# Patient Record
Sex: Male | Born: 1945 | Race: Black or African American | Hispanic: No | Smoking: Former smoker
Health system: Southern US, Community
[De-identification: ages and names within clinical notes are randomized; demographics above are authoritative.]

## PROBLEM LIST (undated history)

## (undated) DIAGNOSIS — I442 Atrioventricular block, complete: Secondary | ICD-10-CM

## (undated) DIAGNOSIS — N189 Chronic kidney disease, unspecified: Secondary | ICD-10-CM

## (undated) DIAGNOSIS — I428 Other cardiomyopathies: Secondary | ICD-10-CM

## (undated) DIAGNOSIS — Z950 Presence of cardiac pacemaker: Secondary | ICD-10-CM

## (undated) DIAGNOSIS — E875 Hyperkalemia: Secondary | ICD-10-CM

## (undated) DIAGNOSIS — I10 Essential (primary) hypertension: Secondary | ICD-10-CM

## (undated) DIAGNOSIS — M109 Gout, unspecified: Secondary | ICD-10-CM

## (undated) DIAGNOSIS — I2510 Atherosclerotic heart disease of native coronary artery without angina pectoris: Secondary | ICD-10-CM

## (undated) DIAGNOSIS — K219 Gastro-esophageal reflux disease without esophagitis: Secondary | ICD-10-CM

## (undated) DIAGNOSIS — I5022 Chronic systolic (congestive) heart failure: Secondary | ICD-10-CM

## (undated) HISTORY — DX: Hyperkalemia: E87.5

## (undated) HISTORY — DX: Gout, unspecified: M10.9

## (undated) HISTORY — DX: Atrioventricular block, complete: I44.2

## (undated) HISTORY — DX: Essential (primary) hypertension: I10

## (undated) HISTORY — DX: Atherosclerotic heart disease of native coronary artery without angina pectoris: I25.10

## (undated) HISTORY — DX: Chronic kidney disease, unspecified: N18.9

## (undated) HISTORY — PX: INGUINAL HERNIA REPAIR: SUR1180

## (undated) HISTORY — DX: Chronic systolic (congestive) heart failure: I50.22

## (undated) HISTORY — DX: Other cardiomyopathies: I42.8

---

## 2001-08-18 ENCOUNTER — Encounter: Admission: RE | Admit: 2001-08-18 | Discharge: 2001-11-15 | Payer: Self-pay | Admitting: *Deleted

## 2002-01-27 ENCOUNTER — Encounter: Admission: RE | Admit: 2002-01-27 | Discharge: 2002-04-27 | Payer: Self-pay | Admitting: *Deleted

## 2002-04-28 ENCOUNTER — Encounter: Admission: RE | Admit: 2002-04-28 | Discharge: 2002-04-28 | Payer: Self-pay | Admitting: *Deleted

## 2002-05-19 ENCOUNTER — Encounter: Admission: RE | Admit: 2002-05-19 | Discharge: 2002-05-19 | Payer: Self-pay | Admitting: *Deleted

## 2003-09-06 ENCOUNTER — Ambulatory Visit (HOSPITAL_COMMUNITY): Admission: RE | Admit: 2003-09-06 | Discharge: 2003-09-06 | Payer: Self-pay | Admitting: Family Medicine

## 2004-11-08 ENCOUNTER — Ambulatory Visit (HOSPITAL_COMMUNITY): Admission: RE | Admit: 2004-11-08 | Discharge: 2004-11-08 | Payer: Self-pay | Admitting: Gastroenterology

## 2006-07-25 ENCOUNTER — Emergency Department (HOSPITAL_COMMUNITY): Admission: EM | Admit: 2006-07-25 | Discharge: 2006-07-25 | Payer: Self-pay | Admitting: Emergency Medicine

## 2009-02-27 ENCOUNTER — Ambulatory Visit (HOSPITAL_COMMUNITY): Admission: RE | Admit: 2009-02-27 | Discharge: 2009-02-27 | Payer: Self-pay | Admitting: General Surgery

## 2009-03-16 ENCOUNTER — Ambulatory Visit (HOSPITAL_COMMUNITY): Admission: RE | Admit: 2009-03-16 | Discharge: 2009-03-16 | Payer: Self-pay | Admitting: Interventional Cardiology

## 2009-05-20 ENCOUNTER — Inpatient Hospital Stay (HOSPITAL_COMMUNITY): Admission: EM | Admit: 2009-05-20 | Discharge: 2009-05-23 | Payer: Self-pay | Admitting: Emergency Medicine

## 2009-05-20 ENCOUNTER — Encounter (INDEPENDENT_AMBULATORY_CARE_PROVIDER_SITE_OTHER): Payer: Self-pay | Admitting: *Deleted

## 2009-05-20 HISTORY — PX: INSERT / REPLACE / REMOVE PACEMAKER: SUR710

## 2010-08-01 ENCOUNTER — Encounter: Payer: Self-pay | Admitting: Internal Medicine

## 2010-08-20 ENCOUNTER — Ambulatory Visit: Payer: Self-pay | Admitting: Cardiology

## 2010-08-26 ENCOUNTER — Ambulatory Visit: Payer: Self-pay | Admitting: Internal Medicine

## 2010-09-05 ENCOUNTER — Encounter
Admission: RE | Admit: 2010-09-05 | Discharge: 2010-09-05 | Payer: Self-pay | Source: Home / Self Care | Attending: General Surgery | Admitting: General Surgery

## 2010-10-08 NOTE — Miscellaneous (Signed)
Summary: Device preload  Clinical Lists Changes  Observations: Added new observation of PPM INDICATN: CHB (08/01/2010 9:18) Added new observation of MAGNET RTE: BOL 85 ERI 65 (08/01/2010 9:18) Added new observation of PPMLEADSTAT2: active (08/01/2010 9:18) Added new observation of PPMLEADSER2: [id number] (08/01/2010 9:18) Added new observation of PPMLEADMOD2: 5076  (08/01/2010 9:18) Added new observation of PPMLEADDOI2: 05/20/2009  (08/01/2010 9:18) Added new observation of PPMLEADLOC2: RV  (08/01/2010 9:18) Added new observation of PPMLEADSTAT1: active  (08/01/2010 9:18) Added new observation of PPMLEADSER1: [id number]  (08/01/2010 9:18) Added new observation of PPMLEADMOD1: 5076  (08/01/2010 9:18) Added new observation of PPMLEADDOI1: 05/20/2009  (08/01/2010 9:18) Added new observation of PPMLEADLOC1: RA  (08/01/2010 9:18) Added new observation of PPM DOI: 05/20/2009  (08/01/2010 9:18) Added new observation of PPM SERL#: [id number]  (08/01/2010 9:18) Added new observation of PPM MODL#: [id number]  (08/01/2010 4:13) Added new observation of PACEMAKERMFG: Medtronic  (08/01/2010 9:18) Added new observation of PPM IMP MD: Lady Deutscher, MD  (08/01/2010 9:18) Added new observation of PPM REFER MD: Peter Swaziland, MD  (08/01/2010 9:18) Added new observation of PACEMAKER MD: Hillis Range, MD  (08/01/2010 9:18)      PPM Specifications Following MD:  Hillis Range, MD     Referring MD:  Peter Swaziland, MD PPM Vendor:  Medtronic     PPM Model Number:  ADDRL1     PPM Serial Number:  [id number] PPM DOI:  05/20/2009     PPM Implanting MD:  Lady Deutscher, MD  Lead 1    Location: RA     DOI: 05/20/2009     Model #: [id number]     Serial #: [id number]     Status: active Lead 2    Location: RV     DOI: 05/20/2009     Model #: [id number]     Serial #: [id number]     Status: active  Magnet Response Rate:  BOL 85 ERI 65  Indications:  CHB

## 2010-10-10 NOTE — Procedures (Signed)
Summary: pacer check/medtronic   Current Medications (verified): 1)  Amlodipine Besylate 5 Mg Tabs (Amlodipine Besylate) .... Take 1 Tablet By Mouth Once Daily 2)  Diphenhydramine Hcl 25 Mg Tabs (Diphenhydramine Hcl) .... Take As Needed 3)  Alavert 10 Mg Tabs (Loratadine) .... Take As Needed 4)  Aspirin 81 Mg Tbec (Aspirin) .... Take 1 Tablet By Mouth Once Daily 5)  Advair Hfa 45-21 Mcg/act Aero (Fluticasone-Salmeterol) .... Use As Needed 6)  Vitamin C-Rose Hips 500 Mg Tabs (Ascorbic Acid) .... Take 1 Tablet By Mouth Once Daily 7)  Vitamin E 600 Unit Caps (Vitamin E) .... Take 1 Capsule By Mouth Once Daily 8)  B Complex-B12  Tabs (B Complex Vitamins) .... Take 1 Tablet By Mouth Once Daily  Allergies (verified): No Known Drug Allergies  PPM Specifications Following MD:  Hillis Range, MD     Referring MD:  Peter Swaziland, MD PPM Vendor:  Medtronic     PPM Model Number:  ADDRL1     PPM Serial Number:  [id number] PPM DOI:  05/20/2009     PPM Implanting MD:  Lady Deutscher, MD  Lead 1    Location: RA     DOI: 05/20/2009     Model #: [id number]     Serial #: [id number]     Status: active Lead 2    Location: RV     DOI: 05/20/2009     Model #: [id number]     Serial #: [id number]     Status: active  Magnet Response Rate:  BOL 85 ERI 65  Indications:  CHB   PPM Follow Up Battery Voltage:  2.78 V     Battery Est. Longevity:  12 yrs       PPM Device Measurements Atrium  Amplitude: 2.00 mV, Impedance: 413 ohms, Threshold: 1.00 V at 0.40 msec Right Ventricle  Amplitude: 8.00 mV, Impedance: 530 ohms, Threshold: 0.50 V at 0.40 msec  Episodes MS Episodes:  1699     Percent Mode Switch:  0.2%     Ventricular High Rate:  1     Atrial Pacing:  1.0%     Ventricular Pacing:  97.1%  Parameters Mode:  DDD     Lower Rate Limit:  60     Upper Rate Limit:  130 Paced AV Delay:  180     Sensed AV Delay:  150 Next Cardiology Appt Due:  02/07/2011 Tech Comments:  [place] [facility] PT---1699 MODE SWITCHES--LONGEST WAS 4 MIN 2  SECONDS.  1 VHR EPISODE LASTING 11 BEATS.  NORMAL DEVICE FUNCTION.  CHANGED RV OUTPUT FROM 2.00 TO 2.5 V. PT NOT INTERESTED IN CARELINK PREFERS OV.  ROV IN 6 MTHS W/JA. Vella Kohler  August 26, 2010 9:56 AM

## 2010-10-10 NOTE — Cardiovascular Report (Signed)
Summary: Office Visit   Office Visit   Imported By: Roderic Ovens 09/17/2010 14:15:21  _____________________________________________________________________  External Attachment:    Type:   Image     Comment:   External Document

## 2010-12-13 LAB — CBC
HCT: 42.7 % (ref 39.0–52.0)
Hemoglobin: 13.3 g/dL (ref 13.0–17.0)
Hemoglobin: 13.9 g/dL (ref 13.0–17.0)
MCHC: 32.6 g/dL (ref 30.0–36.0)
MCV: 93.6 fL (ref 78.0–100.0)
MCV: 94 fL (ref 78.0–100.0)
MCV: 95 fL (ref 78.0–100.0)
Platelets: 109 10*3/uL — ABNORMAL LOW (ref 150–400)
Platelets: 121 10*3/uL — ABNORMAL LOW (ref 150–400)
Platelets: 139 10*3/uL — ABNORMAL LOW (ref 150–400)
RBC: 4.22 MIL/uL (ref 4.22–5.81)
RBC: 4.48 MIL/uL (ref 4.22–5.81)
RBC: 4.49 MIL/uL (ref 4.22–5.81)
RDW: 14.3 % (ref 11.5–15.5)
RDW: 15 % (ref 11.5–15.5)
WBC: 13.2 10*3/uL — ABNORMAL HIGH (ref 4.0–10.5)
WBC: 7.8 10*3/uL (ref 4.0–10.5)
WBC: 8.3 10*3/uL (ref 4.0–10.5)

## 2010-12-13 LAB — BLOOD GAS, ARTERIAL
Acid-base deficit: 2.6 mmol/L — ABNORMAL HIGH (ref 0.0–2.0)
Bicarbonate: 21.7 mEq/L (ref 20.0–24.0)
Drawn by: 246861
O2 Content: 3 L/min
O2 Saturation: 98.3 %
Patient temperature: 98.6
TCO2: 22.8 mmol/L (ref 0–100)
pCO2 arterial: 37.3 mmHg (ref 35.0–45.0)
pH, Arterial: 7.382 (ref 7.350–7.450)
pO2, Arterial: 114 mmHg — ABNORMAL HIGH (ref 80.0–100.0)

## 2010-12-13 LAB — GLUCOSE, CAPILLARY
Glucose-Capillary: 55 mg/dL — ABNORMAL LOW (ref 70–99)
Glucose-Capillary: 55 mg/dL — ABNORMAL LOW (ref 70–99)

## 2010-12-13 LAB — POCT I-STAT 3, ART BLOOD GAS (G3+)
Acid-base deficit: 13 mmol/L — ABNORMAL HIGH (ref 0.0–2.0)
Bicarbonate: 14.7 mEq/L — ABNORMAL LOW (ref 20.0–24.0)
O2 Saturation: 99 %
Patient temperature: 98.6
TCO2: 16 mmol/L (ref 0–100)
pCO2 arterial: 37.3 mmHg (ref 35.0–45.0)
pH, Arterial: 7.203 — ABNORMAL LOW (ref 7.350–7.450)
pO2, Arterial: 163 mmHg — ABNORMAL HIGH (ref 80.0–100.0)

## 2010-12-13 LAB — CARDIAC PANEL(CRET KIN+CKTOT+MB+TROPI)
CK, MB: 3.5 ng/mL (ref 0.3–4.0)
CK, MB: 6.9 ng/mL — ABNORMAL HIGH (ref 0.3–4.0)
CK, MB: 7.9 ng/mL — ABNORMAL HIGH (ref 0.3–4.0)
Relative Index: 0.8 (ref 0.0–2.5)
Relative Index: 1.3 (ref 0.0–2.5)
Relative Index: 1.8 (ref 0.0–2.5)
Total CK: 451 U/L — ABNORMAL HIGH (ref 7–232)
Total CK: 539 U/L — ABNORMAL HIGH (ref 7–232)
Troponin I: 0.08 ng/mL — ABNORMAL HIGH (ref 0.00–0.06)
Troponin I: 0.1 ng/mL — ABNORMAL HIGH (ref 0.00–0.06)

## 2010-12-13 LAB — BASIC METABOLIC PANEL
BUN: 36 mg/dL — ABNORMAL HIGH (ref 6–23)
BUN: 47 mg/dL — ABNORMAL HIGH (ref 6–23)
BUN: 49 mg/dL — ABNORMAL HIGH (ref 6–23)
BUN: 49 mg/dL — ABNORMAL HIGH (ref 6–23)
CO2: 18 mEq/L — ABNORMAL LOW (ref 19–32)
CO2: 22 mEq/L (ref 19–32)
CO2: 26 mEq/L (ref 19–32)
Calcium: 8.5 mg/dL (ref 8.4–10.5)
Calcium: 8.6 mg/dL (ref 8.4–10.5)
Calcium: 9.4 mg/dL (ref 8.4–10.5)
Chloride: 104 mEq/L (ref 96–112)
Chloride: 105 mEq/L (ref 96–112)
Chloride: 107 mEq/L (ref 96–112)
Chloride: 108 mEq/L (ref 96–112)
Chloride: 97 mEq/L (ref 96–112)
Creatinine, Ser: 2.52 mg/dL — ABNORMAL HIGH (ref 0.4–1.5)
Creatinine, Ser: 3.06 mg/dL — ABNORMAL HIGH (ref 0.4–1.5)
Creatinine, Ser: 3.79 mg/dL — ABNORMAL HIGH (ref 0.4–1.5)
Creatinine, Ser: 4.03 mg/dL — ABNORMAL HIGH (ref 0.4–1.5)
Creatinine, Ser: 4.53 mg/dL — ABNORMAL HIGH (ref 0.4–1.5)
GFR calc Af Amer: 16 mL/min — ABNORMAL LOW (ref >60)
GFR calc Af Amer: 18 mL/min — ABNORMAL LOW (ref >60)
GFR calc Af Amer: 20 mL/min — ABNORMAL LOW (ref >60)
GFR calc Af Amer: 21 mL/min — ABNORMAL LOW (ref >60)
GFR calc Af Amer: 31 mL/min — ABNORMAL LOW (ref >60)
GFR calc non Af Amer: 13 mL/min — ABNORMAL LOW (ref >60)
GFR calc non Af Amer: 15 mL/min — ABNORMAL LOW (ref >60)
GFR calc non Af Amer: 18 mL/min — ABNORMAL LOW (ref >60)
GFR calc non Af Amer: 21 mL/min — ABNORMAL LOW (ref >60)
GFR calc non Af Amer: 26 mL/min — ABNORMAL LOW (ref >60)
Glucose, Bld: 104 mg/dL — ABNORMAL HIGH (ref 70–99)
Glucose, Bld: 115 mg/dL — ABNORMAL HIGH (ref 70–99)
Glucose, Bld: 182 mg/dL — ABNORMAL HIGH (ref 70–99)
Potassium: 4.8 mEq/L (ref 3.5–5.1)
Potassium: 5.9 mEq/L — ABNORMAL HIGH (ref 3.5–5.1)
Potassium: 7 mEq/L (ref 3.5–5.1)
Sodium: 137 mEq/L (ref 135–145)
Sodium: 137 mEq/L (ref 135–145)
Sodium: 139 mEq/L (ref 135–145)
Sodium: 141 mEq/L (ref 135–145)

## 2010-12-13 LAB — URINALYSIS, ROUTINE W REFLEX MICROSCOPIC
Glucose, UA: NEGATIVE mg/dL
Ketones, ur: NEGATIVE mg/dL
Nitrite: NEGATIVE
Specific Gravity, Urine: 1.013 (ref 1.005–1.030)
pH: 7.5 (ref 5.0–8.0)

## 2010-12-13 LAB — HEPATIC FUNCTION PANEL
ALT: 170 U/L — ABNORMAL HIGH (ref 0–53)
AST: 224 U/L — ABNORMAL HIGH (ref 0–37)
Albumin: 3.5 g/dL (ref 3.5–5.2)
Alkaline Phosphatase: 159 U/L — ABNORMAL HIGH (ref 39–117)
Bilirubin, Direct: 0.2 mg/dL (ref 0.0–0.3)
Indirect Bilirubin: 1 mg/dL — ABNORMAL HIGH (ref 0.3–0.9)
Total Bilirubin: 1.2 mg/dL (ref 0.3–1.2)
Total Protein: 7.1 g/dL (ref 6.0–8.3)

## 2010-12-13 LAB — LACTIC ACID, PLASMA: Lactic Acid, Venous: 1.9 mmol/L (ref 0.5–2.2)

## 2010-12-13 LAB — PROTIME-INR
INR: 1.2 (ref 0.00–1.49)
INR: 1.2 (ref 0.00–1.49)
Prothrombin Time: 14.7 seconds (ref 11.6–15.2)
Prothrombin Time: 15.4 seconds — ABNORMAL HIGH (ref 11.6–15.2)

## 2010-12-13 LAB — POCT I-STAT, CHEM 8
BUN: 50 mg/dL — ABNORMAL HIGH (ref 6–23)
Calcium, Ion: 1.02 mmol/L — ABNORMAL LOW (ref 1.12–1.32)
Chloride: 111 mEq/L (ref 96–112)
Creatinine, Ser: 3.9 mg/dL — ABNORMAL HIGH (ref 0.4–1.5)
Glucose, Bld: 179 mg/dL — ABNORMAL HIGH (ref 70–99)
HCT: 48 % (ref 39.0–52.0)
Hemoglobin: 16.3 g/dL (ref 13.0–17.0)
Potassium: 5.8 mEq/L — ABNORMAL HIGH (ref 3.5–5.1)
Sodium: 138 mEq/L (ref 135–145)
TCO2: 17 mmol/L (ref 0–100)

## 2010-12-13 LAB — DIFFERENTIAL
Basophils Absolute: 0 10*3/uL (ref 0.0–0.1)
Basophils Relative: 0 % (ref 0–1)
Eosinophils Absolute: 0 10*3/uL (ref 0.0–0.7)
Eosinophils Relative: 0 % (ref 0–5)
Lymphocytes Relative: 16 % (ref 12–46)
Lymphs Abs: 2.2 10*3/uL (ref 0.7–4.0)
Monocytes Absolute: 0.6 10*3/uL (ref 0.1–1.0)
Monocytes Relative: 5 % (ref 3–12)
Neutro Abs: 10.4 10*3/uL — ABNORMAL HIGH (ref 1.7–7.7)
Neutrophils Relative %: 79 % — ABNORMAL HIGH (ref 43–77)

## 2010-12-13 LAB — TROPONIN I: Troponin I: 0.07 ng/mL — ABNORMAL HIGH (ref 0.00–0.06)

## 2010-12-13 LAB — APTT: aPTT: 28 seconds (ref 24–37)

## 2010-12-13 LAB — DIGOXIN LEVEL: Digoxin Level: <0.2 ng/mL — ABNORMAL LOW (ref 0.8–2.0)

## 2010-12-13 LAB — CK TOTAL AND CKMB (NOT AT ARMC)
CK, MB: 7.1 ng/mL — ABNORMAL HIGH (ref 0.3–4.0)
Relative Index: 2.2 (ref 0.0–2.5)
Total CK: 330 U/L — ABNORMAL HIGH (ref 7–232)

## 2010-12-13 LAB — POCT CARDIAC MARKERS
CKMB, poc: 3.1 ng/mL (ref 1.0–8.0)
Myoglobin, poc: >500 ng/mL (ref 12–200)
Troponin i, poc: <0.05 ng/mL (ref 0.00–0.09)

## 2010-12-13 LAB — BRAIN NATRIURETIC PEPTIDE: Pro B Natriuretic peptide (BNP): 612 pg/mL — ABNORMAL HIGH (ref 0.0–100.0)

## 2010-12-16 LAB — COMPREHENSIVE METABOLIC PANEL
ALT: 34 U/L (ref 0–53)
Alkaline Phosphatase: 174 U/L — ABNORMAL HIGH (ref 39–117)
BUN: 39 mg/dL — ABNORMAL HIGH (ref 6–23)
CO2: 27 mEq/L (ref 19–32)
Chloride: 103 mEq/L (ref 96–112)
Glucose, Bld: 107 mg/dL — ABNORMAL HIGH (ref 70–99)
Potassium: 4 mEq/L (ref 3.5–5.1)
Sodium: 136 mEq/L (ref 135–145)
Total Bilirubin: 0.7 mg/dL (ref 0.3–1.2)
Total Protein: 7.3 g/dL (ref 6.0–8.3)

## 2010-12-16 LAB — URINALYSIS, ROUTINE W REFLEX MICROSCOPIC
Bilirubin Urine: NEGATIVE
Hgb urine dipstick: NEGATIVE
Nitrite: NEGATIVE
Protein, ur: NEGATIVE mg/dL
Specific Gravity, Urine: 1.016 (ref 1.005–1.030)
Urobilinogen, UA: 1 mg/dL (ref 0.0–1.0)

## 2010-12-16 LAB — CBC
HCT: 43.5 % (ref 39.0–52.0)
Hemoglobin: 14.7 g/dL (ref 13.0–17.0)
RBC: 4.68 MIL/uL (ref 4.22–5.81)
RDW: 15.2 % (ref 11.5–15.5)
WBC: 5.3 10*3/uL (ref 4.0–10.5)

## 2010-12-16 LAB — DIFFERENTIAL
Basophils Absolute: 0 10*3/uL (ref 0.0–0.1)
Basophils Relative: 1 % (ref 0–1)
Eosinophils Absolute: 0.2 10*3/uL (ref 0.0–0.7)
Monocytes Absolute: 0.5 10*3/uL (ref 0.1–1.0)
Monocytes Relative: 9 % (ref 3–12)
Neutro Abs: 2.6 10*3/uL (ref 1.7–7.7)
Neutrophils Relative %: 49 % (ref 43–77)

## 2010-12-19 ENCOUNTER — Other Ambulatory Visit: Payer: Self-pay | Admitting: *Deleted

## 2010-12-19 DIAGNOSIS — I10 Essential (primary) hypertension: Secondary | ICD-10-CM

## 2010-12-19 MED ORDER — AMLODIPINE BESYLATE 5 MG PO TABS: 5.0000 mg | ORAL_TABLET | Freq: Every day | ORAL | Status: DC

## 2010-12-19 NOTE — Telephone Encounter (Signed)
escribe medication per fax request  

## 2010-12-21 ENCOUNTER — Inpatient Hospital Stay (HOSPITAL_COMMUNITY)
Admission: RE | Admit: 2010-12-21 | Discharge: 2010-12-21 | Disposition: A | Discharge status: Home / Self Care | Payer: BC Managed Care – HMO | Source: Ambulatory Visit | Attending: Family Medicine | Admitting: Family Medicine

## 2010-12-22 ENCOUNTER — Emergency Department (HOSPITAL_COMMUNITY): Payer: BC Managed Care – HMO

## 2010-12-22 ENCOUNTER — Emergency Department (HOSPITAL_COMMUNITY)
Admission: EM | Admit: 2010-12-22 | Discharge: 2010-12-22 | Disposition: A | Discharge status: Home / Self Care | Payer: BC Managed Care – HMO | Attending: Emergency Medicine | Admitting: Emergency Medicine

## 2010-12-22 DIAGNOSIS — M109 Gout, unspecified: Secondary | ICD-10-CM | POA: Insufficient documentation

## 2010-12-22 DIAGNOSIS — Z950 Presence of cardiac pacemaker: Secondary | ICD-10-CM | POA: Insufficient documentation

## 2010-12-22 DIAGNOSIS — M7989 Other specified soft tissue disorders: Secondary | ICD-10-CM | POA: Insufficient documentation

## 2010-12-22 DIAGNOSIS — I10 Essential (primary) hypertension: Secondary | ICD-10-CM | POA: Insufficient documentation

## 2010-12-22 LAB — DIFFERENTIAL
Basophils Absolute: 0 10*3/uL (ref 0.0–0.1)
Basophils Relative: 1 % (ref 0–1)
Eosinophils Absolute: 0.2 10*3/uL (ref 0.0–0.7)
Monocytes Absolute: 0.7 10*3/uL (ref 0.1–1.0)
Monocytes Relative: 10 % (ref 3–12)

## 2010-12-22 LAB — CBC
MCH: 30.3 pg (ref 26.0–34.0)
MCHC: 33.3 g/dL (ref 30.0–36.0)
Platelets: 180 10*3/uL (ref 150–400)
RDW: 14.3 % (ref 11.5–15.5)

## 2010-12-22 LAB — BASIC METABOLIC PANEL
Calcium: 9.1 mg/dL (ref 8.4–10.5)
Creatinine, Ser: 1.25 mg/dL (ref 0.4–1.5)
GFR calc Af Amer: >60 mL/min (ref >60)
GFR calc non Af Amer: 58 mL/min — ABNORMAL LOW (ref >60)
Sodium: 138 mEq/L (ref 135–145)

## 2010-12-22 LAB — URIC ACID: Uric Acid, Serum: 8.4 mg/dL — ABNORMAL HIGH (ref 4.0–7.8)

## 2011-01-21 NOTE — Cardiovascular Report (Signed)
NAME:  [name], [name] NO.:  [id number]   MEDICAL RECORD NO.:  [id number]          PATIENT TYPE:  OIB   LOCATION:  2899                         FACILITY:  [facility]   PHYSICIAN:  [name], MDDATE OF BIRTH:  [birth year]   DATE OF PROCEDURE:  03/16/2009  DATE OF DISCHARGE:  03/16/2009                            CARDIAC CATHETERIZATION   PROCEDURE PERFORMED:  Left heart catheterization, left ventriculogram,  coronary angiogram, and intracoronary vascular ultrasound of the left  anterior descending.   OPERATOR:  Gregory Crafts, MD   INDICATIONS:  Abnormal stress test and abnormal EKG.   PROCEDURE NARRATIVE:  The risks and benefits of cardiac catheterization  were explained to the patient and informed consent was obtained.  He was  brought to the cath lab.  He was prepped and draped in usual sterile  fashion.  His right groin was infiltrated with 1% lidocaine.  A 6-French  sheath was placed into the right femoral artery using modified Seldinger  technique.  Left coronary artery angiography was performed using a JL-  4.0 catheter.  Catheter was advanced to the vessel ostium under  fluoroscopic guidance.  Digital angiography was performed in multiple  projections using hand injection of contrast.  A JR-4 catheter and  subsequently a no torque right catheter were used to try and engage the  right coronary artery.  These were unsuccessful.  A pigtail catheter was  advanced to the left ventricle under fluoroscopic guidance.  Power  injection contrast was performed in the RAO projection to image the left  ventricle.  Catheter was pulled back under continuous hemodynamic  pressure monitoring.  Subsequently, a AR-2 catheter was used, but  unsuccessful finding the right coronary artery.  Subsequently, an [name]  discovered the right coronary artery origin which was coming off the  left cusp.  A CLS 3.5 guiding catheter was then used to engage the left  main and IVUS  of the LAD was performed.  The sheath was removed and an  Angio-Seal was deployed.   FINDINGS:  Left main coronary artery was widely patent.  Left circumflex was a large vessel with mild luminal irregularities.  The OM-1 was a medium-sized vessel and widely patent.  The OM-2 was  small vessel and patent.  The OM-3 was a large vessel with mild  irregularities.  The left anterior descending was a large vessel which reached the apex.  There was a small first diagonal and second diagonal, both of which were  widely patent.  Between the diagonal vessels in the LAD, there was an  approximately 50-60% stenosis.  The right coronary artery was a large dominant vessel.  This vessel  originates off the left cusp.  We used an [name] to engage the vessel.  There was mild-to-moderate atherosclerosis diffusely.  Left ventriculogram showed normal ventricular function with an EF  estimated 55%.   HEMODYNAMICS:  Left ventricular pressure 149/3 with an LVEDP of 16 mmHg.  Aortic pressure 143/84 with a mean aortic pressure of 103 mmHg.   IVUS of The LAD showed no significant stenosis.  Subsequent angiography  of the LAD  showed a significant improvement in the area of stenosis post  IVUS.   IMPRESSION:  1. Mild diffuse coronary artery disease, likely some degree of      vasospasm in the mid left anterior descending.  As the stenosis      resolved at the end of the case without intervention, intracoronary      vascular ultrasound did not show any significant stenosis in the      mid left anterior descending.  2. 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      An [name] catheter was used to engage the right coronary artery.  3. High-degree heart block persists, although the patient is      asymptomatic.  We discussed pacemaker and he is not interested in a      further evaluation at this time since he is not having any      lightheadedness or syncope.   RECOMMENDATIONS:  1. Avoid  rate-slowing drugs.  2. We will treat for vasospasm.  We will low-dose amlodipine.  We may      be able to come off his ARB.  I will follow up with him in the      office.      Gregory Crafts, MD  Electronically Signed     JSV/MEDQ  D:  03/16/2009  T:  03/17/2009  Job:  [id number]   cc:   Lorne Skeens. Hoxworth, M.D.  Lurena Joiner, MD

## 2011-01-24 NOTE — Discharge Summary (Signed)
NAME:  [name], [name] NO.:  [id number]   MEDICAL RECORD NO.:  [id number]          PATIENT TYPE:  AMB   LOCATION:  DAY                          FACILITY:  [facility]   PHYSICIAN:  Sharlet Salina T. Hoxworth, M.D.DATE OF BIRTH:  12-17-1945   DATE OF ADMISSION:  02/27/2009  DATE OF DISCHARGE:  02/27/2009                               DISCHARGE SUMMARY   CHIEF COMPLAINT:  Right inguinal hernia.   HISTORY:  [name] is a 65 year old man brought in for outpatient  right inguinal hernia repair on February 27, 2009.  He had a gradually  increasing swelling in his right groin for several months and on exam  was found to have a partially reducible right inguinal hernia and open  repair was planned.   PAST MEDICAL HISTORY:  Other surgery includes left inguinal hernia  repair 10 years ago, ulnar nerve entrapment repair.  Medically, he is  followed for hypertension and occasional mild bronchospasm.   MEDICATIONS:  1. Avalide daily.  2. Advair Diskus p.r.n.  3. Benadryl.   ALLERGIES:  PENICILLIN.   PHYSICAL EXAMINATION:  Significant for an incompletely reducible right  inguinal hernia.   HOSPITAL COURSE:  On the morning of his procedure, the patient was noted  to be bradycardic in the holding area and as low as 30s with a right  bundle branch block.  After discussion with anesthesiology, we felt his  surgery should be delayed until elective cardiology evaluation.  The  patient was entirely asymptomatic with this.  The patient was therefore  discharged home for further workup.      Lorne Skeens. Hoxworth, M.D.  Electronically Signed     BTH/MEDQ  D:  04/04/2009  T:  04/04/2009  Job:  [id number]

## 2011-01-24 NOTE — Op Note (Signed)
NAME:  [name], [name]              ACCOUNT NO.:  [id number]   MEDICAL RECORD NO.:  [id number]          PATIENT TYPE:  AMB   LOCATION:  ENDO                         FACILITY:  [facility]   PHYSICIAN:  John C. Madilyn Fireman, M.D.    DATE OF BIRTH:  July 17, 1946   DATE OF PROCEDURE:  11/08/2004  DATE OF DISCHARGE:                                 OPERATIVE REPORT   PROCEDURE:  Colonoscopy.   INDICATIONS FOR PROCEDURE:  Average risk colon cancer screening.   DESCRIPTION OF PROCEDURE:  The patient was placed in the left lateral  decubitus position and placed on the pulse monitor with continuous low-flow  oxygen delivered by nasal cannula. He was sedated with 75 mcg IV fentanyl  and 6 milligrams IV Versed. The Olympus video colonoscope was inserted into  the rectum and advanced to the cecum, confirmed by transillumination at  McBurney's point and visualization at the ileocecal valve and appendiceal  orifice. The prep was excellent. The cecum, ascending, transverse,  descending and sigmoid colon all appeared normal with no masses, polyps,  diverticula or other mucosal abnormalities. The rectum likewise appeared  normal. On retroflexed view, the anus revealed no obvious internal  hemorrhoids. The scope was then withdrawn and the patient returned to the  recovery room in stable condition. He tolerated the procedure well and there  were no immediate complications.   IMPRESSION:  Normal colonoscopy.   PLAN:  Next colonoscopy within 10 years to consider flexible sigmoidoscopy  or Hemoccult's in 5 years.      JCH/MEDQ  D:  11/08/2004  T:  11/08/2004  Job:  [id number]   cc:   Molly Maduro A. Nicholos Johns, M.D.  [address]  [place]  [place] [address]  Fax: [phone]

## 2011-01-24 NOTE — Op Note (Signed)
NAME:  TERRIE, GRAJALES              ACCOUNT NO.:  [id number]   MEDICAL RECORD NO.:  [id number]          PATIENT TYPE:  AMB   LOCATION:  ENDO                         FACILITY:  [facility]   PHYSICIAN:  John C. Madilyn Fireman, M.D.    DATE OF BIRTH:  1946/05/01   DATE OF PROCEDURE:  11/08/2004  DATE OF DISCHARGE:                                 OPERATIVE REPORT   INDICATIONS FOR PROCEDURE:  Average risk colon cancer screening.   PROCEDURE:  The patient was placed in the left lateral decubitus position  and placed on pulse monitor with continuous low flow oxygen delivered by  nasal cannula.  She was sedated with   DICTATION ENDS AT THIS POINT      JCH/MEDQ  D:  11/08/2004  T:  11/08/2004  Job:  [id number]

## 2011-03-19 ENCOUNTER — Encounter: Payer: Self-pay | Admitting: Cardiology

## 2011-03-21 ENCOUNTER — Ambulatory Visit (INDEPENDENT_AMBULATORY_CARE_PROVIDER_SITE_OTHER): Payer: BC Managed Care – HMO | Admitting: Internal Medicine

## 2011-03-21 ENCOUNTER — Encounter: Payer: Self-pay | Admitting: Internal Medicine

## 2011-03-21 VITALS — BP 152/90 | HR 76 | Ht 69.5 in | Wt 235.4 lb

## 2011-03-21 DIAGNOSIS — I10 Essential (primary) hypertension: Secondary | ICD-10-CM

## 2011-03-21 DIAGNOSIS — E875 Hyperkalemia: Secondary | ICD-10-CM

## 2011-03-21 DIAGNOSIS — I442 Atrioventricular block, complete: Secondary | ICD-10-CM

## 2011-03-21 LAB — BASIC METABOLIC PANEL
Calcium: 8.6 mg/dL (ref 8.4–10.5)
GFR: 72.71 mL/min (ref >60.00)
Glucose, Bld: 106 mg/dL — ABNORMAL HIGH (ref 70–99)
Sodium: 138 mEq/L (ref 135–145)

## 2011-03-21 NOTE — Assessment & Plan Note (Signed)
The patient is s/p PPM.  His underlying rhythm today remains complete heart block. His pacemaker function is normal.  No changes are made today. I have assured the patient that he will require his pacemaker longterm.  He will return to the device clinic in 6 months.

## 2011-03-21 NOTE — Assessment & Plan Note (Signed)
His last potassium 4/12 was 5.7 .  He states that this has not been repeated. Low K diet advised.  We will check BMET today.

## 2011-03-21 NOTE — Assessment & Plan Note (Signed)
Above goal I have advised that he increase norvasc to 10mg  daily.  He is very clear that he will not change his medicine at this time.  Lifestyle modification is advised.  I have encouraged him to see Dr Swaziland (he states he has an appointment in the next few weeks) to discuss BP control.  The importance of BP control to avoid worsening renal failure, CVA, and CHF/MI were discussed today.

## 2011-03-21 NOTE — Patient Instructions (Signed)
Your physician wants you to follow-up in: 6 months with device clinic You will receive a reminder letter in the mail two months in advance. If you don't receive a letter, please call our office to schedule the follow-up appointment.  Your physician recommends that you return for lab work today BMP

## 2011-03-21 NOTE — Progress Notes (Signed)
[name] is a pleasant 65 y.o. yo patient with a h/o complete heart block sp PPM (MDT) by Dr Reyes Ivan 2010 who presents today to establish care in the Electrophysiology device clinic.   The patient reports doing very well since having a pacemaker implanted and remains very active.  He is convinced that in "the 21st century" that he should not require having a pacemaker.  He inquires about pacemaker removal.   Today, he  denies symptoms of palpitations, chest pain, shortness of breath, orthopnea, PND, lower extremity edema, dizziness, presyncope, syncope, or neurologic sequela.  The patientis tolerating medications without difficulties and is otherwise without complaint today.   Past Medical History  Diagnosis Date  . Complete heart block     s/p PPM by Dr Reyes Ivan  . Chronic renal insufficiency     WITH BASELINE CREATININE OF 1.6  . Hypertension   . Coronary disease     NONOBSTRUCTIVE  . Hyperkalemia     Past Surgical History  Procedure Date  . Pacemaker insertion 05/20/2009    MDT by Dr Reyes Ivan for complete heart block    History   Social History  . Marital Status: Single    Spouse Name: N/A    Number of Children: N/A  . Years of Education: N/A   Occupational History  . Not on file.   Social History Main Topics  . Smoking status: Former Smoker    Quit date: 03/18/1990  . Smokeless tobacco: Not on file  . Alcohol Use: No  . Drug Use: No  . Sexually Active:    Other Topics Concern  . Not on file   Social History Narrative   Lives in [place].    No family history on file.  Allergies  Allergen Reactions  . Avalide (Irbesartan-Hydrochlorothiazide)   . Penicillins   . Tinactin (Tolnaftate)     Current Outpatient Prescriptions  Medication Sig Dispense Refill  . amLODipine (NORVASC) 5 MG tablet Take 1 tablet (5 mg total) by mouth daily.  90 tablet  3  . Ascorbic Acid (VITAMIN C WITH ROSE HIPS) 100 MG tablet Take 100 mg by mouth daily.        Marland Kitchen aspirin 81 MG  tablet Take 81 mg by mouth daily.        Marland Kitchen Cod Liver Oil 1000 MG CAPS Take 1 capsule by mouth.        . Cyanocobalamin (VITAMIN B12 PO) Take by mouth daily.        . Fluticasone-Salmeterol (ADVAIR HFA IN) Inhale into the lungs as needed.        Marland Kitchen VITAMIN E PO Take 1 capsule by mouth.         ROS- all systems are reviewed and negative except as per HPI  Physical Exam: Filed Vitals:   03/21/11 1037  BP: 152/90  Pulse: 76  Height: 5' 9.5" (1.765 m)  Weight: 235 lb 6.4 oz (106.777 kg)    GEN- The patient is overweight, alert and oriented x 3 today.   Head- normocephalic, atraumatic Eyes-  Sclera clear, conjunctiva pink Ears- hearing intact Oropharynx- clear Neck- supple, no JVP Lymph- no cervical lymphadenopathy Lungs- Clear to ausculation bilaterally, normal work of breathing Chest- pacemaker pocket is well healed Heart- Regular rate and rhythm, no murmurs, rubs or gallops, PMI not laterally displaced GI- soft, NT, ND, + BS Extremities- no clubbing, cyanosis, or edema MS- no significant deformity or atrophy Skin- no rash or lesion Psych- euthymic mood, full affect Neuro-  strength and sensation are intact  Pacemaker interrogation- reviewed in detail today,  See PACEART report  Assessment and Plan:

## 2011-03-26 ENCOUNTER — Encounter: Payer: Self-pay | Admitting: Cardiology

## 2011-03-26 ENCOUNTER — Ambulatory Visit (INDEPENDENT_AMBULATORY_CARE_PROVIDER_SITE_OTHER): Payer: BC Managed Care – HMO | Admitting: Cardiology

## 2011-03-26 DIAGNOSIS — N189 Chronic kidney disease, unspecified: Secondary | ICD-10-CM

## 2011-03-26 DIAGNOSIS — I442 Atrioventricular block, complete: Secondary | ICD-10-CM

## 2011-03-26 DIAGNOSIS — N183 Chronic kidney disease, stage 3 unspecified: Secondary | ICD-10-CM | POA: Insufficient documentation

## 2011-03-26 DIAGNOSIS — I10 Essential (primary) hypertension: Secondary | ICD-10-CM

## 2011-03-26 NOTE — Assessment & Plan Note (Signed)
I explained to the patient that he still has complete heart block and will require his pacemaker indefinitely. There does not appear to be a reversible cause for his heart block. I explained that I do not know the cause of his heart block but that his high blood pressure may have played a role. He has no evidence of sarcoidosis by chest x-ray. His prior cardiac evaluation was otherwise unremarkable. He seems more acceptable of this fact and will continue with his pacemaker followup.

## 2011-03-26 NOTE — Progress Notes (Signed)
   [name] Date of Birth: 07-03-1946   History of Present Illness: [name] is seen today for followup. He has a history of complete heart block and presented in September of 2010 with a heart rate of 10. He had a permanent pacemaker placed at that time. He did have acute renal insufficiency and hyperkalemia at the time. He has a number of questions today concerning his pacemaker. He has done some reading on the Internet and is convinced that he would be able to get his pacemaker out. Based on his recent evaluation with Dr. Johney Frame his underlying rhythm was still complete heart block with a slow ventricular response. He is also interested in trying to modify his blood pressure with lifestyle modification and a supplement. He reports that his blood pressure at home has been 140/90 fairly consistently.  Current Outpatient Prescriptions on File Prior to Visit  Medication Sig Dispense Refill  . amLODipine (NORVASC) 5 MG tablet Take 1 tablet (5 mg total) by mouth daily.  90 tablet  3  . Ascorbic Acid (VITAMIN C WITH ROSE HIPS) 100 MG tablet Take 100 mg by mouth daily.        Marland Kitchen aspirin 81 MG tablet Take 81 mg by mouth daily.        Marland Kitchen Cod Liver Oil 1000 MG CAPS Take 1 capsule by mouth.        . Fluticasone-Salmeterol (ADVAIR HFA IN) Inhale into the lungs as needed.        Marland Kitchen VITAMIN E PO Take 1 capsule by mouth as needed.         Allergies  Allergen Reactions  . Avalide (Irbesartan-Hydrochlorothiazide)   . Penicillins   . Tinactin (Tolnaftate)     Past Medical History  Diagnosis Date  . Complete heart block     s/p PPM by Dr Reyes Ivan  . Chronic renal insufficiency     WITH BASELINE CREATININE OF 1.6  . Hypertension   . Coronary disease     NONOBSTRUCTIVE  . Hyperkalemia     Past Surgical History  Procedure Date  . Pacemaker insertion 05/20/2009    MDT by Dr Reyes Ivan for complete heart block    History  Smoking status  . Former Smoker  . Quit date: 03/18/1990  Smokeless  tobacco  . Not on file    History  Alcohol Use No    History reviewed. No pertinent family history.  Review of Systems:  All other systems were reviewed and are negative.  Physical Exam: BP 140/86  Pulse 82  Ht 5' 9.5" (1.765 m)  Wt 228 lb 9.6 oz (103.692 kg)  BMI 33.27 kg/m2  SpO2 99% He is an overweight black male in no acute distress. His HEENT exam is unremarkable. He has no JVD or bruits. Lungs are clear. Cardiac exam reveals a regular rate and rhythm without gallop, murmur, or click. His pacemaker site is normal. Abdomen is soft and nontender without masses or bruits. He has good pedal pulses. He has no edema. Skin is warm and dry. He is alert and oriented x3. Cranial nerves II through XII are intact. LABORATORY DATA: Recent basic metabolic panel showed a creatinine of 1.3 with a potassium of 4.3.  Assessment / Plan:

## 2011-03-26 NOTE — Patient Instructions (Signed)
Continue your current medications.  Work on controlling your weight and get regular exercise.  I will see you again in one year.

## 2011-03-26 NOTE — Assessment & Plan Note (Signed)
His blood pressure is mildly elevated on amlodipine. He is intolerant of Avalide because of hyperkalemia and renal insufficiency. I encouraged him with his lifestyle modification including weight loss and appropriate dietary measures. I informed him that if we could consistently get his blood pressure down by 10 points then we could consider reducing or stopping his antihypertensive therapy. I think it is likely that he will require long-term therapy.

## 2011-05-23 ENCOUNTER — Ambulatory Visit (INDEPENDENT_AMBULATORY_CARE_PROVIDER_SITE_OTHER): Payer: BC Managed Care – HMO | Admitting: Cardiology

## 2011-05-23 DIAGNOSIS — I10 Essential (primary) hypertension: Secondary | ICD-10-CM

## 2011-05-23 NOTE — Progress Notes (Signed)
Patient scheduled visit today just to request his records for the [place]. No visit charged.  [name]

## 2011-05-26 ENCOUNTER — Telehealth: Payer: Self-pay | Admitting: Cardiology

## 2011-05-26 NOTE — Telephone Encounter (Signed)
Received signed Berkley Harvey to release med rec info from patient in the office for the purposes of the [place].  Placed physical chart with release form on [facility] desk to be picked up on Tuesday.

## 2011-10-24 ENCOUNTER — Telehealth: Payer: Self-pay | Admitting: Internal Medicine

## 2011-10-24 NOTE — Telephone Encounter (Signed)
10-24-11 PT TO CALL BACK WHEN HE HAS HIS SCHEDULE, NEEDS PACEMAKER CK WITH CLINIC/MT

## 2011-11-01 ENCOUNTER — Encounter (HOSPITAL_COMMUNITY): Payer: Self-pay | Admitting: Emergency Medicine

## 2011-11-01 ENCOUNTER — Emergency Department (HOSPITAL_COMMUNITY)
Admission: EM | Admit: 2011-11-01 | Discharge: 2011-11-01 | Disposition: A | Discharge status: Home Health | Payer: BC Managed Care – HMO | Source: Home / Self Care

## 2011-11-01 DIAGNOSIS — Z760 Encounter for issue of repeat prescription: Secondary | ICD-10-CM

## 2011-11-01 DIAGNOSIS — J45909 Unspecified asthma, uncomplicated: Secondary | ICD-10-CM

## 2011-11-01 MED ORDER — FLUTICASONE-SALMETEROL 250-50 MCG/DOSE IN AEPB: 1.0000 | INHALATION_SPRAY | Freq: Two times a day (BID) | RESPIRATORY_TRACT | Status: DC

## 2011-11-01 NOTE — ED Provider Notes (Signed)
Medical screening examination/treatment/procedure(s) were performed by a resident physician and as supervising physician I was immediately available for consultation/collaboration.  Leslee Home, M.D.   Roque Lias, MD 11/01/11 2049

## 2011-11-01 NOTE — ED Notes (Signed)
Asthma-like symptoms aggravated the past few days.  Patient reports sob and wheezing all night last night.  Ran out of medicine

## 2011-11-01 NOTE — Discharge Instructions (Signed)
Thank you for coming in today. Your advair should be at your pharmacy now.  Please call [phone] to get a primary care doctor in your area. [facility] has internal medicine doctors who do primary care and they accept new patients.  Your blood pressure is 183/113. This is dangerously high. You are at risk for heart failure, stroke, or death. Please follow up with your cardiologist.

## 2011-11-01 NOTE — ED Provider Notes (Signed)
[name] is a 66 y.o. male who presents to Urgent Care today for refill of Advair.  He takes Advair intermittently for nasal congestion and posterior nasal drip. He has run out of this medication and would like a refill. He denies any wheezing or history of wheezing.  He did not have her primary care doctor however he does have a cardiologist.  He has not seen a doctor in over a year for routine health maintenance.     PMH reviewed. Significant for hypertension ROS as above otherwise neg Medications reviewed. No current facility-administered medications for this encounter.   Current Outpatient Prescriptions  Medication Sig Dispense Refill  . amLODipine (NORVASC) 5 MG tablet Take 1 tablet (5 mg total) by mouth daily.  90 tablet  3  . Ascorbic Acid (VITAMIN C WITH ROSE HIPS) 100 MG tablet Take 100 mg by mouth daily.        Marland Kitchen aspirin 81 MG tablet Take 81 mg by mouth daily.        Marland Kitchen Cod Liver Oil 1000 MG CAPS Take 1 capsule by mouth.        . Fluticasone-Salmeterol (ADVAIR HFA IN) Inhale into the lungs as needed.        Marland Kitchen VITAMIN E PO Take 1 capsule by mouth as needed.       . Fluticasone-Salmeterol (ADVAIR DISKUS) 250-50 MCG/DOSE AEPB Inhale 1 puff into the lungs 2 (two) times daily.  60 each  0    Exam:  BP 157/105  Pulse 84  Temp(Src) 98.7 F (37.1 C) (Oral)  Resp 18  SpO2 97% Gen: Well NAD HEENT: EOMI,  MMM Lungs: CTABL Nl WOB Heart: RRR no MRG Abd: NABS, NT, ND Exts: Non edematous BL  LE, warm and well perfused.   Assessment and Plan: 66 year old male with hypertension and desire to refill Advair.  Asymptomatic with hypertension however I am concerned. I advised that he followup with a primary care doctor or his cardiologist for titration of his amlodipine.    I will give him a one-month supply of Advair and advised him to follow up with her primary care doctor for further refills.  He expresses understand     Clementeen Graham, MD 11/01/11 [phone]

## 2011-11-06 ENCOUNTER — Encounter: Payer: Self-pay | Admitting: Internal Medicine

## 2011-11-06 ENCOUNTER — Ambulatory Visit (INDEPENDENT_AMBULATORY_CARE_PROVIDER_SITE_OTHER): Payer: BC Managed Care – HMO | Admitting: *Deleted

## 2011-11-06 DIAGNOSIS — I442 Atrioventricular block, complete: Secondary | ICD-10-CM

## 2011-11-06 LAB — PACEMAKER DEVICE OBSERVATION
AL THRESHOLD: 1.25 V
ATRIAL PACING PM: 1
BAMS-0001: 175 {beats}/min
RV LEAD AMPLITUDE: 5.6 mv
RV LEAD THRESHOLD: 0.75 V

## 2011-11-06 NOTE — Progress Notes (Signed)
PPM check 

## 2011-11-12 ENCOUNTER — Other Ambulatory Visit: Payer: Self-pay | Admitting: *Deleted

## 2011-11-12 DIAGNOSIS — I10 Essential (primary) hypertension: Secondary | ICD-10-CM

## 2011-11-12 MED ORDER — AMLODIPINE BESYLATE 5 MG PO TABS: 5.0000 mg | ORAL_TABLET | Freq: Every day | ORAL | Status: DC

## 2012-04-07 ENCOUNTER — Telehealth: Payer: Self-pay | Admitting: Internal Medicine

## 2012-04-07 NOTE — Telephone Encounter (Signed)
04-07-12  CALLED PT TO SET UP PACER CK,PT TO CALL BACK WHEN HE KNOWS HIS SCHEDULE/MT

## 2012-05-12 ENCOUNTER — Encounter: Payer: Self-pay | Admitting: Internal Medicine

## 2012-05-12 ENCOUNTER — Ambulatory Visit (INDEPENDENT_AMBULATORY_CARE_PROVIDER_SITE_OTHER): Payer: BC Managed Care – HMO | Admitting: *Deleted

## 2012-05-12 DIAGNOSIS — I442 Atrioventricular block, complete: Secondary | ICD-10-CM

## 2012-05-12 NOTE — Progress Notes (Signed)
PPM check 

## 2012-05-13 LAB — PACEMAKER DEVICE OBSERVATION
AL IMPEDENCE PM: 462 Ohm
AL THRESHOLD: 0.75 V
BATTERY VOLTAGE: 2.79 V
RV LEAD AMPLITUDE: 8 mv
RV LEAD IMPEDENCE PM: 531 Ohm

## 2012-08-16 ENCOUNTER — Other Ambulatory Visit: Payer: Self-pay

## 2012-08-16 DIAGNOSIS — I10 Essential (primary) hypertension: Secondary | ICD-10-CM

## 2012-08-16 MED ORDER — AMLODIPINE BESYLATE 5 MG PO TABS: 5.0000 mg | ORAL_TABLET | Freq: Every day | ORAL | Status: DC

## 2012-09-17 ENCOUNTER — Telehealth: Payer: Self-pay | Admitting: Cardiology

## 2012-09-17 DIAGNOSIS — I10 Essential (primary) hypertension: Secondary | ICD-10-CM

## 2012-09-17 MED ORDER — AMLODIPINE BESYLATE 5 MG PO TABS: 5.0000 mg | ORAL_TABLET | Freq: Every day | ORAL | Status: DC

## 2012-09-17 NOTE — Telephone Encounter (Signed)
New Problem:    Patient called in needing a 90 day refill of his amLODipine (NORVASC) 5 MG tablet.

## 2012-09-21 ENCOUNTER — Encounter: Payer: Self-pay | Admitting: Cardiology

## 2012-09-21 ENCOUNTER — Ambulatory Visit (INDEPENDENT_AMBULATORY_CARE_PROVIDER_SITE_OTHER): Payer: BC Managed Care – HMO | Admitting: Cardiology

## 2012-09-21 VITALS — BP 158/100 | HR 88 | Ht 69.5 in | Wt 232.1 lb

## 2012-09-21 DIAGNOSIS — N189 Chronic kidney disease, unspecified: Secondary | ICD-10-CM

## 2012-09-21 DIAGNOSIS — I442 Atrioventricular block, complete: Secondary | ICD-10-CM

## 2012-09-21 DIAGNOSIS — I10 Essential (primary) hypertension: Secondary | ICD-10-CM

## 2012-09-21 DIAGNOSIS — E875 Hyperkalemia: Secondary | ICD-10-CM

## 2012-09-21 MED ORDER — AMLODIPINE BESYLATE 10 MG PO TABS: 10.0000 mg | ORAL_TABLET | Freq: Every day | ORAL | Status: DC

## 2012-09-21 NOTE — Patient Instructions (Signed)
Increase amlodipine 10 mg daily  I will see you back in 1 year.

## 2012-09-21 NOTE — Progress Notes (Signed)
   [name] Date of Birth: 09-12-1945   History of Present Illness: [name] is seen today for followup. He has a history of complete heart block and presented in September of 2010 with a heart rate of 10. He had a permanent pacemaker placed at that time. He did have acute renal failure and hyperkalemia at the time. 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 He reports his BP has been consistently high in the 145-160 systolic range. He denies any chest pain, SOB, palpitations or dizzyness. He reports he had an echocardiogram done at the [place] but doesn't know the results.  Current Outpatient Prescriptions on File Prior to Visit  Medication Sig Dispense Refill  . Ascorbic Acid (VITAMIN C WITH ROSE HIPS) 100 MG tablet Take 3,000 mg by mouth daily.       Marland Kitchen aspirin 81 MG tablet Take 81 mg by mouth daily.        . Fluticasone-Salmeterol (ADVAIR HFA IN) Inhale into the lungs as needed.        . Fluticasone-Salmeterol (ADVAIR) 250-50 MCG/DOSE AEPB Inhale 1 puff into the lungs as needed.      Marland Kitchen amLODipine (NORVASC) 10 MG tablet Take 1 tablet (10 mg total) by mouth daily.  90 tablet  3    Allergies  Allergen Reactions  . Avalide (Irbesartan-Hydrochlorothiazide)   . Penicillins   . Tinactin (Tolnaftate)     Past Medical History  Diagnosis Date  . Complete heart block     s/p PPM by Dr Reyes Ivan  . Chronic renal insufficiency     WITH BASELINE CREATININE OF 1.6  . Hypertension   . Coronary disease     NONOBSTRUCTIVE  . Hyperkalemia     Past Surgical History  Procedure Date  . Pacemaker insertion 05/20/2009    MDT by Dr Reyes Ivan for complete heart block    History  Smoking status  . Former Smoker  . Quit date: 03/18/1990  Smokeless tobacco  . Not on file    History  Alcohol Use  . Yes    History reviewed. No pertinent family history.  Review of Systems:  All other systems were reviewed and are negative.  Physical  Exam: BP 158/100  Pulse 88  Ht 5' 9.5" (1.765 m)  Wt 232 lb 1.9 oz (105.289 kg)  BMI 33.79 kg/m2  SpO2 99% He is an overweight black male in no acute distress. His HEENT exam is unremarkable. He has no JVD or bruits. Lungs are clear. Cardiac exam reveals a regular rate and rhythm without gallop, murmur, or click. His pacemaker site is normal. Abdomen is soft and nontender without masses or bruits. He has good pedal pulses. He has no edema. Skin is warm and dry. He is alert and oriented x3. Cranial nerves II through XII are intact. LABORATORY DATA:   Assessment / Plan: 1. Complete heart block s/p PPM. Apparently he is still pacemaker dependent. Follow up in our pacemaker clinic in March.  2. CKD stage 2.   3 .HTN- not well controlled. Will increase amlodipine to 10 mg daily. If BP still poorly controlled would consider carvedilol. Would avoid ACEi/ARB given history of acute renal failure and hyperkalemia on Avalide in past.

## 2012-11-15 ENCOUNTER — Ambulatory Visit (INDEPENDENT_AMBULATORY_CARE_PROVIDER_SITE_OTHER): Payer: BC Managed Care – HMO | Admitting: Internal Medicine

## 2012-11-15 ENCOUNTER — Encounter: Payer: Self-pay | Admitting: Internal Medicine

## 2012-11-15 VITALS — Wt 231.0 lb

## 2012-11-15 DIAGNOSIS — I442 Atrioventricular block, complete: Secondary | ICD-10-CM

## 2012-11-15 DIAGNOSIS — I10 Essential (primary) hypertension: Secondary | ICD-10-CM

## 2012-11-15 LAB — PACEMAKER DEVICE OBSERVATION
AL IMPEDENCE PM: 443 Ohm
AL THRESHOLD: 0.75 V
ATRIAL PACING PM: 0.7
BAMS-0001: 175 {beats}/min
BATTERY VOLTAGE: 2.79 V
RV LEAD AMPLITUDE: 15.68 mv

## 2012-11-15 NOTE — Progress Notes (Signed)
PCP: Lolita Patella, MD Primary Cardiologist:  Dr [place]  [name] is a 67 y.o. male who presents today for routine electrophysiology followup.  Since last being seen in our clinic, the patient reports doing very well.  He remains very active.  He enjoys sky diving, motorcycle riding, and driving fast cars.  Today, he denies symptoms of palpitations, chest pain, shortness of breath,  lower extremity edema, dizziness, presyncope, or syncope.  The patient is otherwise without complaint today.   Past Medical History  Diagnosis Date  . Complete heart block     s/p PPM by Dr Reyes Ivan  . Chronic renal insufficiency     WITH BASELINE CREATININE OF 1.6  . Hypertension   . Coronary disease     NONOBSTRUCTIVE  . Hyperkalemia    Past Surgical History  Procedure Laterality Date  . Pacemaker insertion  05/20/2009    MDT by Dr Reyes Ivan for complete heart block    Current Outpatient Prescriptions  Medication Sig Dispense Refill  . amLODipine (NORVASC) 10 MG tablet Take 1 tablet (10 mg total) by mouth daily.  90 tablet  3  . Ascorbic Acid (VITAMIN C WITH ROSE HIPS) 100 MG tablet Take 3,000 mg by mouth daily.       Marland Kitchen aspirin 81 MG tablet Take 81 mg by mouth daily.        . Fluticasone-Salmeterol (ADVAIR HFA IN) Inhale into the lungs as needed.        . Fluticasone-Salmeterol (ADVAIR) 250-50 MCG/DOSE AEPB Inhale 1 puff into the lungs as needed.       No current facility-administered medications for this visit.    Physical Exam: Filed Vitals:   11/15/12 1609  Weight: 231 lb (104.781 kg)    GEN- The patient is well appearing, alert and oriented x 3 today.   Head- normocephalic, atraumatic Eyes-  Sclera clear, conjunctiva pink Ears- hearing intact Oropharynx- clear Lungs- Clear to ausculation bilaterally, normal work of breathing Chest- pacemaker pocket is well healed Heart- Regular rate and rhythm, no murmurs, rubs or gallops, PMI not laterally displaced GI- soft, NT, ND, +  BS Extremities- no clubbing, cyanosis, or edema  Pacemaker interrogation- reviewed in detail today,  See PACEART report  Assessment and Plan:  1. Complete heart block Normal pacemaker function See Pace Art report No changes today  2. HTN Stable No change required today   Return to the device clinic in 6 months. I will see again in 1 year

## 2012-11-15 NOTE — Patient Instructions (Addendum)
Your physician wants you to follow-up in: 6 months with device clinic and 12 months with Dr Allred You will receive a reminder letter in the mail two months in advance. If you don't receive a letter, please call our office to schedule the follow-up appointment.  

## 2013-02-08 ENCOUNTER — Telehealth: Payer: Self-pay | Admitting: Internal Medicine

## 2013-02-08 NOTE — Telephone Encounter (Signed)
New Prob     Pt has some questions regarding office visit on 11/15/12. Please call.

## 2013-02-08 NOTE — Telephone Encounter (Signed)
Spoke with patient and he is not happy about bill he just received for 11/15/12.  Says he sat in lobby for then was brought back and his device was interrogated then he left. Does not fook like a BP was done at the visit

## 2013-02-11 NOTE — Telephone Encounter (Signed)
This note was given to Doylene Bode, RN nurse manager and she was going to forward to the appropriate person or persons to handle and contact the patient

## 2013-07-04 ENCOUNTER — Telehealth: Payer: Self-pay | Admitting: Cardiology

## 2013-07-04 NOTE — Telephone Encounter (Signed)
New Problem:  Pt states he has some concerns that he wants to explain to Mc[place]an over the phone. Pt states he will give more details when [place] calls.

## 2013-07-04 NOTE — Telephone Encounter (Signed)
Returned call to patient he stated has been sob.Stated he saw PCP last week and he is no better, continues to be sob.Stated he would like to be seen this week to make sure sob is not his heart.Stated he did not want to see PA.Appointment scheduled with Dr.[name] 07/08/13.

## 2013-07-08 ENCOUNTER — Ambulatory Visit (INDEPENDENT_AMBULATORY_CARE_PROVIDER_SITE_OTHER): Payer: BC Managed Care – HMO | Admitting: Cardiology

## 2013-07-08 ENCOUNTER — Encounter: Payer: Self-pay | Admitting: Cardiology

## 2013-07-08 VITALS — BP 142/94 | HR 90 | Ht 69.5 in | Wt 238.0 lb

## 2013-07-08 DIAGNOSIS — N189 Chronic kidney disease, unspecified: Secondary | ICD-10-CM

## 2013-07-08 DIAGNOSIS — R0600 Dyspnea, unspecified: Secondary | ICD-10-CM | POA: Insufficient documentation

## 2013-07-08 DIAGNOSIS — I442 Atrioventricular block, complete: Secondary | ICD-10-CM

## 2013-07-08 DIAGNOSIS — I10 Essential (primary) hypertension: Secondary | ICD-10-CM

## 2013-07-08 DIAGNOSIS — R0609 Other forms of dyspnea: Secondary | ICD-10-CM

## 2013-07-08 MED ORDER — FUROSEMIDE 20 MG PO TABS: 20.0000 mg | ORAL_TABLET | Freq: Every day | ORAL | Status: DC

## 2013-07-08 NOTE — Patient Instructions (Signed)
Take Lasix 20 mg daily.  Restrict your salt intake.  We will schedule you for an echocardiogram

## 2013-07-08 NOTE — Progress Notes (Signed)
[name] Date of Birth: 1945/10/11   History of Present Illness: [name] is seen today for followup. He has a history of complete heart block and presented in September of 2010 with a heart rate of 10. He had a permanent pacemaker placed at that time. He did have acute renal failure and hyperkalemia at the time. 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  He reports he had an echocardiogram done at the [place] over a year ago but we never received the results. He presents now with a two-week history of increasing dyspnea. He complains of dysmenorrhea exertion walking 50 yards. He also complains of orthopnea and PND. He has had significant increase in lower extremity edema. His amlodipine dose was recently reduced. He was given an inhaler and he is scheduled for PFTs. He is any symptoms of chest pain, palpitations, or dizziness. His last pacemaker check in March was satisfactory.  Current Outpatient Prescriptions on File Prior to Visit  Medication Sig Dispense Refill  . Ascorbic Acid (VITAMIN C WITH ROSE HIPS) 100 MG tablet Take 3,000 mg by mouth daily.       Marland Kitchen aspirin 81 MG tablet Take 81 mg by mouth daily.        . Fluticasone-Salmeterol (ADVAIR HFA IN) Inhale into the lungs as needed.        . Fluticasone-Salmeterol (ADVAIR) 250-50 MCG/DOSE AEPB Inhale 1 puff into the lungs as needed.       No current facility-administered medications on file prior to visit.    Allergies  Allergen Reactions  . Avalide [Irbesartan-Hydrochlorothiazide]   . Penicillins   . Tinactin [Tolnaftate]     Past Medical History  Diagnosis Date  . Complete heart block     s/p PPM by Dr Reyes Ivan  . Chronic renal insufficiency     WITH BASELINE CREATININE OF 1.6  . Hypertension   . Coronary disease     NONOBSTRUCTIVE  . Hyperkalemia     Past Surgical History  Procedure Laterality Date  . Pacemaker insertion  05/20/2009    MDT by Dr Reyes Ivan for  complete heart block    History  Smoking status  . Former Smoker  . Quit date: 03/18/1990  Smokeless tobacco  . Not on file    History  Alcohol Use  . Yes    No family history on file.  Review of Systems:  All other systems were reviewed and are negative.  Physical Exam: BP 142/94  Pulse 90  Ht 5' 9.5" (1.765 m)  Wt 238 lb (107.956 kg)  BMI 34.65 kg/m2 He is an overweight black male in no acute distress. His HEENT exam is unremarkable. He has no JVD or bruits. Lungs are clear. Cardiac exam reveals a regular rate and rhythm. Normal S1 and S2. He has a gallop. His pacemaker site is normal. Abdomen is soft and nontender without masses or bruits. He has good pedal pulses. He has 2-3+ pretibial edema. Skin is warm and dry. He is alert and oriented x3. Cranial nerves II through XII are intact.  LABORATORY DATA: ECG today demonstrates atrial sensing and ventricular pacing at a rate of 89 beats per minute.  Assessment / Plan: 1. Complete heart block s/p PPM. Continue followup in our pacemaker clinic.  2. CKD stage 2.   3 .HTN- control has been satisfactory. Recent reduction in his amlodipine dose.  4. Increased edema and dyspnea. I am concerned that he  has developed congestive heart failure. I started him on Lasix 20 mg daily. We will check an echocardiogram here. Prior echocardiogram in 2010 showed normal LV function. I recommended sodium restriction. He has a followup with Dr. Allyne Gee in early November and will need a basic metabolic panel checked at that time. If cardiac function has declined significantly we'll need to initiate appropriate heart failure therapy.

## 2013-07-22 ENCOUNTER — Encounter: Payer: Self-pay | Admitting: Cardiology

## 2013-07-22 ENCOUNTER — Ambulatory Visit (HOSPITAL_COMMUNITY): Payer: BC Managed Care – HMO | Attending: Cardiology | Admitting: Radiology

## 2013-07-22 DIAGNOSIS — R0600 Dyspnea, unspecified: Secondary | ICD-10-CM

## 2013-07-22 DIAGNOSIS — Z87891 Personal history of nicotine dependence: Secondary | ICD-10-CM | POA: Insufficient documentation

## 2013-07-22 DIAGNOSIS — I442 Atrioventricular block, complete: Secondary | ICD-10-CM

## 2013-07-22 DIAGNOSIS — I059 Rheumatic mitral valve disease, unspecified: Secondary | ICD-10-CM | POA: Insufficient documentation

## 2013-07-22 DIAGNOSIS — I359 Nonrheumatic aortic valve disorder, unspecified: Secondary | ICD-10-CM | POA: Insufficient documentation

## 2013-07-22 DIAGNOSIS — R0609 Other forms of dyspnea: Secondary | ICD-10-CM | POA: Insufficient documentation

## 2013-07-22 DIAGNOSIS — N189 Chronic kidney disease, unspecified: Secondary | ICD-10-CM | POA: Insufficient documentation

## 2013-07-22 DIAGNOSIS — E669 Obesity, unspecified: Secondary | ICD-10-CM | POA: Insufficient documentation

## 2013-07-22 DIAGNOSIS — Z950 Presence of cardiac pacemaker: Secondary | ICD-10-CM | POA: Insufficient documentation

## 2013-07-22 DIAGNOSIS — I2789 Other specified pulmonary heart diseases: Secondary | ICD-10-CM | POA: Insufficient documentation

## 2013-07-22 DIAGNOSIS — I129 Hypertensive chronic kidney disease with stage 1 through stage 4 chronic kidney disease, or unspecified chronic kidney disease: Secondary | ICD-10-CM | POA: Insufficient documentation

## 2013-07-22 DIAGNOSIS — R0602 Shortness of breath: Secondary | ICD-10-CM | POA: Insufficient documentation

## 2013-07-22 DIAGNOSIS — I079 Rheumatic tricuspid valve disease, unspecified: Secondary | ICD-10-CM | POA: Insufficient documentation

## 2013-07-22 DIAGNOSIS — R0989 Other specified symptoms and signs involving the circulatory and respiratory systems: Secondary | ICD-10-CM | POA: Insufficient documentation

## 2013-07-22 DIAGNOSIS — I10 Essential (primary) hypertension: Secondary | ICD-10-CM

## 2013-07-22 NOTE — Progress Notes (Signed)
Echocardiogram performed.  

## 2013-07-26 ENCOUNTER — Other Ambulatory Visit: Payer: Self-pay

## 2013-07-26 DIAGNOSIS — I10 Essential (primary) hypertension: Secondary | ICD-10-CM

## 2013-07-26 DIAGNOSIS — R0602 Shortness of breath: Secondary | ICD-10-CM

## 2013-07-26 MED ORDER — ISOSORBIDE MONONITRATE ER 30 MG PO TB24: 30.0000 mg | ORAL_TABLET | Freq: Every day | ORAL | Status: DC

## 2013-07-26 MED ORDER — FUROSEMIDE 40 MG PO TABS: 40.0000 mg | ORAL_TABLET | Freq: Every day | ORAL | Status: DC

## 2013-07-26 MED ORDER — HYDRALAZINE HCL 25 MG PO TABS: 25.0000 mg | ORAL_TABLET | Freq: Three times a day (TID) | ORAL | Status: DC

## 2013-07-28 ENCOUNTER — Other Ambulatory Visit (INDEPENDENT_AMBULATORY_CARE_PROVIDER_SITE_OTHER): Payer: BC Managed Care – HMO

## 2013-07-28 ENCOUNTER — Ambulatory Visit
Admission: RE | Admit: 2013-07-28 | Discharge: 2013-07-28 | Disposition: A | Discharge status: Home / Self Care | Payer: BC Managed Care – PPO | Source: Ambulatory Visit | Attending: Cardiology | Admitting: Cardiology

## 2013-07-28 DIAGNOSIS — I10 Essential (primary) hypertension: Secondary | ICD-10-CM

## 2013-07-28 DIAGNOSIS — R0602 Shortness of breath: Secondary | ICD-10-CM

## 2013-07-28 LAB — CBC WITH DIFFERENTIAL/PLATELET
Basophils Absolute: 0 10*3/uL (ref 0.0–0.1)
Basophils Relative: 0.7 % (ref 0.0–3.0)
Eosinophils Absolute: 0.3 10*3/uL (ref 0.0–0.7)
HCT: 40.4 % (ref 39.0–52.0)
Hemoglobin: 13.2 g/dL (ref 13.0–17.0)
Lymphs Abs: 1.9 10*3/uL (ref 0.7–4.0)
MCHC: 32.6 g/dL (ref 30.0–36.0)
MCV: 91.2 fl (ref 78.0–100.0)
Monocytes Absolute: 0.6 10*3/uL (ref 0.1–1.0)
Neutro Abs: 2.2 10*3/uL (ref 1.4–7.7)
RBC: 4.43 Mil/uL (ref 4.22–5.81)
RDW: 15.6 % — ABNORMAL HIGH (ref 11.5–14.6)

## 2013-07-28 LAB — BASIC METABOLIC PANEL
Calcium: 8.9 mg/dL (ref 8.4–10.5)
Creatinine, Ser: 1.7 mg/dL — ABNORMAL HIGH (ref 0.4–1.5)
Sodium: 137 mEq/L (ref 135–145)

## 2013-07-28 LAB — LIPID PANEL: Triglycerides: 54 mg/dL (ref 0.0–149.0)

## 2013-07-28 LAB — HEPATIC FUNCTION PANEL
Alkaline Phosphatase: 134 U/L — ABNORMAL HIGH (ref 39–117)
Bilirubin, Direct: 0.2 mg/dL (ref 0.0–0.3)
Total Bilirubin: 1.5 mg/dL — ABNORMAL HIGH (ref 0.3–1.2)

## 2013-08-08 HISTORY — PX: CARDIAC CATHETERIZATION: SHX172

## 2013-08-11 ENCOUNTER — Encounter: Payer: Self-pay | Admitting: Cardiology

## 2013-08-11 ENCOUNTER — Ambulatory Visit (INDEPENDENT_AMBULATORY_CARE_PROVIDER_SITE_OTHER): Payer: BC Managed Care – HMO | Admitting: Cardiology

## 2013-08-11 VITALS — BP 141/88 | HR 96 | Ht 69.0 in | Wt 225.0 lb

## 2013-08-11 DIAGNOSIS — I5022 Chronic systolic (congestive) heart failure: Secondary | ICD-10-CM | POA: Insufficient documentation

## 2013-08-11 DIAGNOSIS — I509 Heart failure, unspecified: Secondary | ICD-10-CM

## 2013-08-11 DIAGNOSIS — N189 Chronic kidney disease, unspecified: Secondary | ICD-10-CM

## 2013-08-11 DIAGNOSIS — I442 Atrioventricular block, complete: Secondary | ICD-10-CM

## 2013-08-11 DIAGNOSIS — I5021 Acute systolic (congestive) heart failure: Secondary | ICD-10-CM

## 2013-08-11 DIAGNOSIS — I10 Essential (primary) hypertension: Secondary | ICD-10-CM

## 2013-08-11 LAB — CBC WITH DIFFERENTIAL/PLATELET
Basophils Absolute: 0.1 10*3/uL (ref 0.0–0.1)
Eosinophils Absolute: 0.2 10*3/uL (ref 0.0–0.7)
Hemoglobin: 13.2 g/dL (ref 13.0–17.0)
Lymphocytes Relative: 21.6 % (ref 12.0–46.0)
MCHC: 32.8 g/dL (ref 30.0–36.0)
MCV: 89.8 fl (ref 78.0–100.0)
Monocytes Absolute: 0.6 10*3/uL (ref 0.1–1.0)
Neutro Abs: 3.8 10*3/uL (ref 1.4–7.7)
Neutrophils Relative %: 64 % (ref 43.0–77.0)
RDW: 15.4 % — ABNORMAL HIGH (ref 11.5–14.6)

## 2013-08-11 LAB — BASIC METABOLIC PANEL
CO2: 32 mEq/L (ref 19–32)
Calcium: 9 mg/dL (ref 8.4–10.5)
Chloride: 104 mEq/L (ref 96–112)
Creatinine, Ser: 1.7 mg/dL — ABNORMAL HIGH (ref 0.4–1.5)
Glucose, Bld: 103 mg/dL — ABNORMAL HIGH (ref 70–99)

## 2013-08-11 NOTE — Patient Instructions (Signed)
Continue your current medication  We will schedule you for a cardiac cath procedure  Do not take Lasix the day before and the day of your heart procedure.

## 2013-08-11 NOTE — Progress Notes (Signed)
[name] Date of Birth: 1945-12-20   History of Present Illness: [name] is seen today for followup of congestive heart failure. He has a history of complete heart block and presented in September of 2010 with a heart rate of 10. He had a permanent pacemaker placed at that time. He did have acute renal failure and hyperkalemia at the time. Since then his creatinine has decreased to 1.3. He was either an ACE inhibitor or ARB at that time. 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  He reports he had an echocardiogram done at the [place] over a year ago but we never received the results. He presented recently with increasing symptoms of dyspnea, orthopnea, and edema. He had significant weight gain. He was diuresed and his symptoms improved. Subsequent followup echocardiogram shows marked LV dysfunction with ejection fraction of 20-25%. He was started on nitrates and hydralazine. He initially had a headache but this has improved.  Current Outpatient Prescriptions on File Prior to Visit  Medication Sig Dispense Refill  . amLODipine (NORVASC) 10 MG tablet Take 5 mg by mouth daily.      Marland Kitchen amLODipine (NORVASC) 5 MG tablet Take 1 tablet by mouth daily.      . Ascorbic Acid (VITAMIN C WITH ROSE HIPS) 100 MG tablet Take 3,000 mg by mouth daily.       Marland Kitchen aspirin 81 MG tablet Take 81 mg by mouth daily.        . Ergocalciferol (VITAMIN D2 PO) Take by mouth 2 (two) times a week. Tuesday and Friday      . Fluticasone-Salmeterol (ADVAIR HFA IN) Inhale into the lungs as needed.        . Fluticasone-Salmeterol (ADVAIR) 250-50 MCG/DOSE AEPB Inhale 1 puff into the lungs as needed.      . furosemide (LASIX) 40 MG tablet Take 1 tablet (40 mg total) by mouth daily.  30 tablet  6  . GARLIC PO Take by mouth daily.      . Glucosamine HCl (GLUCOSAMINE PO) Take by mouth daily.      . hydrALAZINE (APRESOLINE) 25 MG tablet Take 1 tablet (25 mg total) by mouth 3 (three) times daily.  100 tablet  6  . isosorbide  mononitrate (IMDUR) 30 MG 24 hr tablet Take 1 tablet (30 mg total) by mouth daily.  30 tablet  6  . PROAIR HFA 108 (90 BASE) MCG/ACT inhaler daily.      . Testosterone (AXIRON) 30 MG/ACT SOLN Place onto the skin daily.       No current facility-administered medications on file prior to visit.    Allergies  Allergen Reactions  . Avalide [Irbesartan-Hydrochlorothiazide]   . Penicillins   . Tinactin [Tolnaftate]     Past Medical History  Diagnosis Date  . Complete heart block     s/p PPM by Dr Reyes Ivan  . Chronic renal insufficiency     WITH BASELINE CREATININE OF 1.6  . Hypertension   . Coronary disease     NONOBSTRUCTIVE  . Hyperkalemia     Past Surgical History  Procedure Laterality Date  . Pacemaker insertion  05/20/2009    MDT by Dr Reyes Ivan for complete heart block    History  Smoking status  . Former Smoker  . Quit date: 03/18/1990  Smokeless tobacco  . Not on file    History  Alcohol Use  . Yes    History reviewed. No pertinent family history.  Review of Systems:  All  other systems were reviewed and are negative.  Physical Exam: BP 141/88  Pulse 96  Ht 5\' 9"  (1.753 m)  Wt 225 lb (102.059 kg)  BMI 33.21 kg/m2 He is an overweight black male in no acute distress. His HEENT exam is unremarkable. He has no JVD or bruits. Lungs are clear. Cardiac exam reveals a regular rate and rhythm. Normal S1 and S2. He has a gallop. His pacemaker site is normal. Abdomen is soft and nontender without masses or bruits. He has good pedal pulses. He has 1+ pretibial edema. Skin is warm and dry. He is alert and oriented x3. Cranial nerves II through XII are intact.  LABORATORY DATA: Lab Results  Component Value Date   WBC 6.0 08/11/2013   HGB 13.2 08/11/2013   HCT 40.1 08/11/2013   PLT 189.0 08/11/2013   GLUCOSE 103* 08/11/2013   CHOL 162 07/28/2013   TRIG 54.0 07/28/2013   HDL 47.30 07/28/2013   LDLCALC 104* 07/28/2013   ALT 44 07/28/2013   AST 33 07/28/2013   NA 139  08/11/2013   K 4.5 08/11/2013   CL 104 08/11/2013   CREATININE 1.7* 08/11/2013   BUN 36* 08/11/2013   CO2 32 08/11/2013   TSH 1.67 07/28/2013   INR 1.1* 08/11/2013   Echo:Study Conclusions  - Left ventricle: The cavity size was mildly dilated. Wall thickness was increased in a pattern of mild LVH. Systolic function was severely reduced. The estimated ejection fraction was in the range of 20% to 25%. Diffuse hypokinesis. Doppler parameters are consistent with restrictive physiology, indicative of decreased left ventricular diastolic compliance and/or increased left atrial pressure. E/medial e' > 15 suggests LV end diastolic pressure at least 20 mmHg. - Aortic valve: There was no stenosis. - Mitral valve: Mildly calcified annulus. Normal thickness leaflets . Mild regurgitation. - Left atrium: The atrium was mildly to moderately dilated. - Right ventricle: The cavity size was mildly dilated. Pacer wire or catheter noted in right ventricle. Systolic function was mildly reduced. - Right atrium: The atrium was mildly dilated. - Tricuspid valve: Mild-moderate regurgitation. Peak RV-RA gradient:59mm Hg (S). - Pulmonary arteries: PA peak pressure: 68mm Hg (S). - Systemic veins: IVC measured 2.5 cm with some respirophasic variation, suggesting RA pressure 15 mmHg. Impressions:  - Mildly dilated LV with mild LV hypertrophy. EF 20-25%, diffuse hypokinesis. Restrictive diastolic function with evidence for elevated LV filling pressure. Mildly dilated RV with mildly decreased systolic function. Moderate pulmonary hypertension.   Assessment / Plan: 1. Acute systolic congestive heart failure. Ejection fraction of 20-25%. Etiology is unclear. No recent viral illnesses. No history of alcohol abuse. No history of chemotherapy. This is new compared to 2010 when LV function was normal. He is intolerant to ACE inhibitors/ARBs related to hyperkalemia and acute renal failure. We'll continue with his  current diuretic therapy. We have added long-acting nitrates and hydralazine. We will consider the addition of beta blocker therapy. I recommended a right and left heart catheterization to further evaluate his cardiac status to rule out significant obstructive coronary disease. This will be scheduled for next week. He will hold his Lasix the day before and the day of his procedure. He is instructed to maintain good hydration.   2. CKD stage 3.   3 .HTN- control has been satisfactory. Recent reduction in his amlodipine dose.  4. Complete heart block status post permanent pacemaker.  5. Pulmonary hypertension

## 2013-08-15 ENCOUNTER — Encounter (HOSPITAL_COMMUNITY): Payer: Self-pay | Admitting: Respiratory Therapy

## 2013-08-16 ENCOUNTER — Other Ambulatory Visit: Payer: Self-pay | Admitting: Cardiology

## 2013-08-16 ENCOUNTER — Ambulatory Visit (HOSPITAL_COMMUNITY)
Admission: RE | Admit: 2013-08-16 | Discharge: 2013-08-16 | Disposition: A | Discharge status: Home / Self Care | Payer: BC Managed Care – PPO | Source: Ambulatory Visit | Attending: Cardiology | Admitting: Cardiology

## 2013-08-16 ENCOUNTER — Encounter (HOSPITAL_COMMUNITY)
Admission: RE | Disposition: A | Discharge status: Home / Self Care | Payer: Self-pay | Source: Ambulatory Visit | Attending: Cardiology

## 2013-08-16 ENCOUNTER — Telehealth: Payer: Self-pay | Admitting: Cardiology

## 2013-08-16 DIAGNOSIS — I129 Hypertensive chronic kidney disease with stage 1 through stage 4 chronic kidney disease, or unspecified chronic kidney disease: Secondary | ICD-10-CM | POA: Insufficient documentation

## 2013-08-16 DIAGNOSIS — I5021 Acute systolic (congestive) heart failure: Secondary | ICD-10-CM

## 2013-08-16 DIAGNOSIS — I2510 Atherosclerotic heart disease of native coronary artery without angina pectoris: Secondary | ICD-10-CM | POA: Insufficient documentation

## 2013-08-16 DIAGNOSIS — Z79899 Other long term (current) drug therapy: Secondary | ICD-10-CM | POA: Insufficient documentation

## 2013-08-16 DIAGNOSIS — Z950 Presence of cardiac pacemaker: Secondary | ICD-10-CM | POA: Insufficient documentation

## 2013-08-16 DIAGNOSIS — I509 Heart failure, unspecified: Secondary | ICD-10-CM

## 2013-08-16 DIAGNOSIS — N183 Chronic kidney disease, stage 3 unspecified: Secondary | ICD-10-CM | POA: Insufficient documentation

## 2013-08-16 HISTORY — PX: LEFT AND RIGHT HEART CATHETERIZATION WITH CORONARY ANGIOGRAM: SHX5449

## 2013-08-16 LAB — POCT I-STAT 3, ART BLOOD GAS (G3+)
O2 Saturation: 89 %
TCO2: 24 mmol/L (ref 0–100)
pCO2 arterial: 39.9 mmHg (ref 35.0–45.0)
pH, Arterial: 7.355 (ref 7.350–7.450)
pO2, Arterial: 59 mmHg — ABNORMAL LOW (ref 80.0–100.0)

## 2013-08-16 LAB — POCT I-STAT 3, VENOUS BLOOD GAS (G3P V)
Bicarbonate: 25.4 mEq/L — ABNORMAL HIGH (ref 20.0–24.0)
O2 Saturation: 62 %
TCO2: 27 mmol/L (ref 0–100)
pCO2, Ven: 45.4 mmHg (ref 45.0–50.0)
pH, Ven: 7.356 — ABNORMAL HIGH (ref 7.250–7.300)

## 2013-08-16 SURGERY — LEFT AND RIGHT HEART CATHETERIZATION WITH CORONARY ANGIOGRAM
Anesthesia: LOCAL

## 2013-08-16 MED ORDER — VERAPAMIL HCL 2.5 MG/ML IV SOLN
INTRAVENOUS | Status: AC
  Filled 2013-08-16: qty 2

## 2013-08-16 MED ORDER — FENTANYL CITRATE 0.05 MG/ML IJ SOLN
INTRAMUSCULAR | Status: AC
  Filled 2013-08-16: qty 2

## 2013-08-16 MED ORDER — SODIUM CHLORIDE 0.9 % IJ SOLN: 3.0000 mL | INTRAMUSCULAR | Status: DC | PRN

## 2013-08-16 MED ORDER — ACETAMINOPHEN 325 MG PO TABS: 650.0000 mg | ORAL_TABLET | ORAL | Status: DC | PRN

## 2013-08-16 MED ORDER — ASPIRIN 81 MG PO CHEW: 81.0000 mg | CHEWABLE_TABLET | ORAL | Status: DC

## 2013-08-16 MED ORDER — CARVEDILOL 3.125 MG PO TABS: 3.1250 mg | ORAL_TABLET | Freq: Two times a day (BID) | ORAL | Status: DC

## 2013-08-16 MED ORDER — SODIUM CHLORIDE 0.9 % IV SOLN: 250.0000 mL | INTRAVENOUS | Status: DC | PRN

## 2013-08-16 MED ORDER — HEPARIN (PORCINE) IN NACL 2-0.9 UNIT/ML-% IJ SOLN
INTRAMUSCULAR | Status: AC
  Filled 2013-08-16: qty 1500

## 2013-08-16 MED ORDER — SODIUM CHLORIDE 0.9 % IV SOLN
INTRAVENOUS | Status: DC
  Administered 2013-08-16: 09:00:00 via INTRAVENOUS

## 2013-08-16 MED ORDER — LIDOCAINE HCL (PF) 1 % IJ SOLN
INTRAMUSCULAR | Status: AC
  Filled 2013-08-16: qty 30

## 2013-08-16 MED ORDER — ONDANSETRON HCL 4 MG/2ML IJ SOLN: 4.0000 mg | Freq: Four times a day (QID) | INTRAMUSCULAR | Status: DC | PRN

## 2013-08-16 MED ORDER — HEPARIN SODIUM (PORCINE) 1000 UNIT/ML IJ SOLN
INTRAMUSCULAR | Status: AC
  Filled 2013-08-16: qty 1

## 2013-08-16 MED ORDER — SODIUM CHLORIDE 0.9 % IV SOLN: 1.0000 mL/kg/h | INTRAVENOUS | Status: DC

## 2013-08-16 MED ORDER — MIDAZOLAM HCL 2 MG/2ML IJ SOLN
INTRAMUSCULAR | Status: AC
  Filled 2013-08-16: qty 2

## 2013-08-16 MED ORDER — NITROGLYCERIN 0.2 MG/ML ON CALL CATH LAB
INTRAVENOUS | Status: AC
  Filled 2013-08-16: qty 1

## 2013-08-16 MED ORDER — SODIUM CHLORIDE 0.9 % IJ SOLN: 3.0000 mL | Freq: Two times a day (BID) | INTRAMUSCULAR | Status: DC

## 2013-08-16 NOTE — Interval H&P Note (Signed)
History and Physical Interval Note:  08/16/2013 11:05 AM  [name]  has presented today for surgery, with the diagnosis of Chest pain  The various methods of treatment have been discussed with the patient and family. After consideration of risks, benefits and other options for treatment, the patient has consented to  Procedure(s): LEFT AND RIGHT HEART CATHETERIZATION WITH CORONARY ANGIOGRAM (N/A) as a surgical intervention .  The patient's history has been reviewed, patient examined, no change in status, stable for surgery.  I have reviewed the patient's chart and labs.  Questions were answered to the patient's satisfaction.   Cath Lab Visit (complete for each Cath Lab visit)  Clinical Evaluation Leading to the Procedure:   ACS: no  Non-ACS:    Anginal Classification: CCS III  Anti-ischemic medical therapy: Maximal Therapy (2 or more classes of medications)  Non-Invasive Test Results: No non-invasive testing performed  Prior CABG: No previous CABG        Theron Arista [facility] 08/16/2013 11:05 AM

## 2013-08-16 NOTE — Telephone Encounter (Signed)
Returned call to patient he stated he is at hospital waiting to be discharged.Stated he wanted Dr.[name] to know he takes Allegra 150 mg as needed.

## 2013-08-16 NOTE — Telephone Encounter (Signed)
New Problem:  Pt is requesting a call back from the nurse. Pt states he will give more details when she calls.

## 2013-08-16 NOTE — CV Procedure (Signed)
   Cardiac Catheterization Procedure Note  Name: [name] MRN: [id number] DOB: 12-15-1945  Procedure: Right Heart Cath, Left Heart Cath, Selective Coronary Angiography, LV angiography  Indication: 67 yo BM with new onset CHF. Echo shows EF 20-25%.     Procedural Details: The right radial site was prepped, draped, and anesthetized with 1% lidocaine. Using the modified Seldinger technique a 5 French sheath was placed in the right radial artery and a 5 French sheath was placed in the right brachial vein. A Swan-Ganz catheter was used for the right heart catheterization. Standard protocol was followed for recording of right heart pressures and sampling of oxygen saturations. Fick cardiac output was calculated. Standard Judkins catheters were used for selective coronary angiography and left ventriculography. There were no immediate procedural complications. The patient was transferred to the post catheterization recovery area for further monitoring.  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  Left mainstem: Normal  Left anterior descending (LAD): large vessel with 20-30% disease in the mid vessel. Several diagonal branches are normal.  Left circumflex (LCx): large vessel giving off 3 OMs. Mild disease proximally to 20%.  Right coronary artery (RCA): Arises anomalously from the LCA. Incompletely visualized but vessel is large without significant disease.  Left ventriculography: Not done.  Final Conclusions:   1. 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 3. Moderate pulmonary HTN with elevated filling  pressures.   Recommendations: Will treat medically. Patient is intolerant of ARB/ACEi due to ARF and hyperkalemia. Will optimize therapy with nitrates, hydralazine, lasix, and carvedilol.    Theron Arista [facility] 08/16/2013, 11:39 AM

## 2013-08-16 NOTE — H&P (View-Only) (Signed)
 [name] Date of Birth: 04/03/1946   History of Present Illness: [name] is seen today for followup of congestive heart failure. He has a history of complete heart block and presented in September of 2010 with a heart rate of 10. He had a permanent pacemaker placed at that time. He did have acute renal failure and hyperkalemia at the time. Since then his creatinine has decreased to 1.3. He was either an ACE inhibitor or ARB at that time. 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  He reports [facility] but we never received the results. He presented recently with increasing symptoms of dyspnea, orthopnea, and edema. He had significant weight gain. He was diuresed and his symptoms improved. Subsequent followup echocardiogram shows marked LV dysfunction with ejection fraction of 20-25%. He was started on nitrates and hydralazine. He initially had a headache but this has improved.  Current Outpatient Prescriptions on File Prior to Visit  Medication Sig Dispense Refill  . amLODipine (NORVASC) 10 MG tablet Take 5 mg by mouth daily.      . amLODipine (NORVASC) 5 MG tablet Take 1 tablet by mouth daily.      . Ascorbic Acid (VITAMIN C WITH ROSE HIPS) 100 MG tablet Take 3,000 mg by mouth daily.       . aspirin 81 MG tablet Take 81 mg by mouth daily.        . Ergocalciferol (VITAMIN D2 PO) Take by mouth 2 (two) times a week. Tuesday and Friday      . Fluticasone-Salmeterol (ADVAIR HFA IN) Inhale into the lungs as needed.        . Fluticasone-Salmeterol (ADVAIR) 250-50 MCG/DOSE AEPB Inhale 1 puff into the lungs as needed.      . furosemide (LASIX) 40 MG tablet Take 1 tablet (40 mg total) by mouth daily.  30 tablet  6  . GARLIC PO Take by mouth daily.      . Glucosamine HCl (GLUCOSAMINE PO) Take by mouth daily.      . hydrALAZINE (APRESOLINE) 25 MG tablet Take 1 tablet (25 mg total) by mouth 3 (three) times daily.  100 tablet  6  . isosorbide  mononitrate (IMDUR) 30 MG 24 hr tablet Take 1 tablet (30 mg total) by mouth daily.  30 tablet  6  . PROAIR HFA 108 (90 BASE) MCG/ACT inhaler daily.      . Testosterone (AXIRON) 30 MG/ACT SOLN Place onto the skin daily.       No current facility-administered medications on file prior to visit.    Allergies  Allergen Reactions  . Avalide [Irbesartan-Hydrochlorothiazide]   . Penicillins   . Tinactin [Tolnaftate]     Past Medical History  Diagnosis Date  . Complete heart block     s/p PPM by Dr Kersey  . Chronic renal insufficiency     WITH BASELINE CREATININE OF 1.6  . Hypertension   . Coronary disease     NONOBSTRUCTIVE  . Hyperkalemia     Past Surgical History  Procedure Laterality Date  . Pacemaker insertion  05/20/2009    MDT by Dr Kersey for complete heart block    History  Smoking status  . Former Smoker  . Quit date: 03/18/1990  Smokeless tobacco  . Not on file    History  Alcohol Use  . Yes    History reviewed. No pertinent family history.  Review of Systems:  All   other systems were reviewed and are negative.  Physical Exam: BP 141/88  Pulse 96  Ht 5' 9" (1.753 m)  Wt 225 lb (102.059 kg)  BMI 33.21 kg/m2 He is an overweight black male in no acute distress. His HEENT exam is unremarkable. He has no JVD or bruits. Lungs are clear. Cardiac exam reveals a regular rate and rhythm. Normal S1 and S2. He has a gallop. His pacemaker site is normal. Abdomen is soft and nontender without masses or bruits. He has good pedal pulses. He has 1+ pretibial edema. Skin is warm and dry. He is alert and oriented x3. Cranial nerves II through XII are intact.  LABORATORY DATA: Lab Results  Component Value Date   WBC 6.0 08/11/2013   HGB 13.2 08/11/2013   HCT 40.1 08/11/2013   PLT 189.0 08/11/2013   GLUCOSE 103* 08/11/2013   CHOL 162 07/28/2013   TRIG 54.0 07/28/2013   HDL 47.30 07/28/2013   LDLCALC 104* 07/28/2013   ALT 44 07/28/2013   AST 33 07/28/2013   NA 139  08/11/2013   K 4.5 08/11/2013   CL 104 08/11/2013   CREATININE 1.7* 08/11/2013   BUN 36* 08/11/2013   CO2 32 08/11/2013   TSH 1.67 07/28/2013   INR 1.1* 08/11/2013   Echo:Study Conclusions  - Left ventricle: The cavity size was mildly dilated. Wall thickness was increased in a pattern of mild LVH. Systolic function was severely reduced. The estimated ejection fraction was in the range of 20% to 25%. Diffuse hypokinesis. Doppler parameters are consistent with restrictive physiology, indicative of decreased left ventricular diastolic compliance and/or increased left atrial pressure. E/medial e' > 15 suggests LV end diastolic pressure at least 20 mmHg. - Aortic valve: There was no stenosis. - Mitral valve: Mildly calcified annulus. Normal thickness leaflets . Mild regurgitation. - Left atrium: The atrium was mildly to moderately dilated. - Right ventricle: The cavity size was mildly dilated. Pacer wire or catheter noted in right ventricle. Systolic function was mildly reduced. - Right atrium: The atrium was mildly dilated. - Tricuspid valve: Mild-moderate regurgitation. Peak RV-RA gradient:53mm Hg (S). - Pulmonary arteries: PA peak pressure: 68mm Hg (S). - Systemic veins: IVC measured 2.5 cm with some respirophasic variation, suggesting RA pressure 15 mmHg. Impressions:  - Mildly dilated LV with mild LV hypertrophy. EF 20-25%, diffuse hypokinesis. Restrictive diastolic function with evidence for elevated LV filling pressure. Mildly dilated RV with mildly decreased systolic function. Moderate pulmonary hypertension.   Assessment / Plan: 1. Acute systolic congestive heart failure. Ejection fraction of 20-25%. Etiology is unclear. No recent viral illnesses. No history of alcohol abuse. No history of chemotherapy. This is new compared to 2010 when LV function was normal. He is intolerant to ACE inhibitors/ARBs related to hyperkalemia and acute renal failure. We'll continue with his  current diuretic therapy. We have added long-acting nitrates and hydralazine. We will consider the addition of beta blocker therapy. I recommended a right and left heart catheterization to further evaluate his cardiac status to rule out significant obstructive coronary disease. This will be scheduled for next week. He will hold his Lasix the day before and the day of his procedure. He is instructed to maintain good hydration.   2. CKD stage 3.   3 .HTN- control has been satisfactory. Recent reduction in his amlodipine dose.  4. Complete heart block status post permanent pacemaker.  5. Pulmonary hypertension 

## 2013-08-16 NOTE — Telephone Encounter (Signed)
Spoke with patient he also wants to know when his next appointment is.Will check with Dr.[name] 08/18/13 and call him back.

## 2013-08-18 NOTE — Telephone Encounter (Signed)
Returned call to patient spoke to Dr.[name] he advised ok to take Allegra.Post hospital appointment scheduled 09/12/13 at 11:45 am.

## 2013-09-12 ENCOUNTER — Encounter: Payer: Self-pay | Admitting: Cardiology

## 2013-09-12 ENCOUNTER — Ambulatory Visit (INDEPENDENT_AMBULATORY_CARE_PROVIDER_SITE_OTHER): Payer: BC Managed Care – PPO | Admitting: Cardiology

## 2013-09-12 VITALS — BP 120/63 | HR 80 | Ht 69.0 in | Wt 218.0 lb

## 2013-09-12 DIAGNOSIS — I10 Essential (primary) hypertension: Secondary | ICD-10-CM

## 2013-09-12 DIAGNOSIS — I442 Atrioventricular block, complete: Secondary | ICD-10-CM

## 2013-09-12 DIAGNOSIS — N183 Chronic kidney disease, stage 3 unspecified: Secondary | ICD-10-CM

## 2013-09-12 MED ORDER — CARVEDILOL 6.25 MG PO TABS: 6.2500 mg | ORAL_TABLET | Freq: Two times a day (BID) | ORAL | Status: DC

## 2013-09-12 NOTE — Patient Instructions (Signed)
Increase carvedilol to 6.25 mg twice a day.  Continue your other therapy.  I will see you in 4 weeks with blood work

## 2013-09-12 NOTE — Progress Notes (Signed)
[name] Date of Birth: 1945/11/27   History of Present Illness: Mr. Drummer is seen today for followup of congestive heart failure. He has a history of complete heart block and presented in September of 2010 with a heart rate of 10. He had a permanent pacemaker placed at that time. He did have acute renal failure and hyperkalemia at the time. Since then his creatinine has decreased to 1.3. He was on either an ACE inhibitor or ARB at that time. In November he presented with worsening CHF.  Echocardiogram showed marked LV dysfunction with ejection fraction of 20-25%. He was started on nitrates and hydralazine. He underwent right and left heart cath on 08/16/13 as noted below. On follow up today he is feeling much better. He denies any SOB or chest pain. He has lost 20 lbs. He has developed gout in his left hand. He denies any dizzyness.  Current Outpatient Prescriptions on File Prior to Visit  Medication Sig Dispense Refill  . albuterol (PROVENTIL HFA;VENTOLIN HFA) 108 (90 BASE) MCG/ACT inhaler Inhale 2 puffs into the lungs every 6 (six) hours as needed for wheezing or shortness of breath.      Marland Kitchen amLODipine (NORVASC) 5 MG tablet Take 5 mg by mouth daily.      . Ascorbic Acid (VITAMIN C) 1000 MG tablet Take 2,000 mg by mouth every Monday, Wednesday, and Friday.      Marland Kitchen aspirin 81 MG tablet Take 81 mg by mouth daily.        . Fluticasone-Salmeterol (ADVAIR) 250-50 MCG/DOSE AEPB Inhale 1 puff into the lungs as needed.      . furosemide (LASIX) 40 MG tablet Take 1 tablet (40 mg total) by mouth daily.  30 tablet  6  . GARLIC PO Take 1 tablet by mouth daily.       . Glucosamine HCl (GLUCOSAMINE PO) Take 1 tablet by mouth daily.       . hydrALAZINE (APRESOLINE) 25 MG tablet Take 1 tablet (25 mg total) by mouth 3 (three) times daily.  100 tablet  6  . isosorbide mononitrate (IMDUR) 30 MG 24 hr tablet Take 1 tablet (30 mg total) by mouth daily.  30 tablet  6  . Testosterone (AXIRON) 30 MG/ACT SOLN  Place 1 application onto the skin daily.       . Vitamin D, Ergocalciferol, (DRISDOL) 50000 UNITS CAPS capsule Take 50,000 Units by mouth 2 (two) times a week. Take on tuesdays and fridays       No current facility-administered medications on file prior to visit.    Allergies  Allergen Reactions  . Avalide [Irbesartan-Hydrochlorothiazide]   . Penicillins   . Tinactin [Tolnaftate]     Past Medical History  Diagnosis Date  . Complete heart block     s/p PPM by Dr Reyes Ivan  . Chronic renal insufficiency     WITH BASELINE CREATININE OF 1.6  . Hypertension   . Coronary disease     NONOBSTRUCTIVE  . Hyperkalemia     Past Surgical History  Procedure Laterality Date  . Pacemaker insertion  05/20/2009    MDT by Dr Reyes Ivan for complete heart block    History  Smoking status  . Former Smoker  . Quit date: 03/18/1990  Smokeless tobacco  . Not on file    History  Alcohol Use  . Yes    No family history on file.  Review of Systems:  As note in HPI. All other systems were reviewed and are  negative.  Physical Exam: BP 120/63  Pulse 80  Ht 5\' 9"  (1.753 m)  Wt 218 lb (98.884 kg)  BMI 32.18 kg/m2 He is an overweight black male in no acute distress. His HEENT exam is unremarkable. He has no JVD or bruits. Lungs are clear. Cardiac exam reveals a regular rate and rhythm. Normal S1 and S2. He has no gallop. His pacemaker site is normal. Abdomen is soft and nontender without masses or bruits. He has good pedal pulses. He has no edema.  His left hand is swollen and mildly tender to touch. Skin is warm and dry. He is alert and oriented x3. Cranial nerves II through XII are intact.  LABORATORY DATA: Lab Results  Component Value Date   WBC 6.0 08/11/2013   HGB 13.2 08/11/2013   HCT 40.1 08/11/2013   PLT 189.0 08/11/2013   GLUCOSE 103* 08/11/2013   CHOL 162 07/28/2013   TRIG 54.0 07/28/2013   HDL 47.30 07/28/2013   LDLCALC 104* 07/28/2013   ALT 44 07/28/2013   AST 33 07/28/2013   NA  139 08/11/2013   K 4.5 08/11/2013   CL 104 08/11/2013   CREATININE 1.7* 08/11/2013   BUN 36* 08/11/2013   CO2 32 08/11/2013   TSH 1.67 07/28/2013   INR 1.1* 08/11/2013   Echo:Study Conclusions  - Left ventricle: The cavity size was mildly dilated. Wall thickness was increased in a pattern of mild LVH. Systolic function was severely reduced. The estimated ejection fraction was in the range of 20% to 25%. Diffuse hypokinesis. Doppler parameters are consistent with restrictive physiology, indicative of decreased left ventricular diastolic compliance and/or increased left atrial pressure. E/medial e' > 15 suggests LV end diastolic pressure at least 20 mmHg. - Aortic valve: There was no stenosis. - Mitral valve: Mildly calcified annulus. Normal thickness leaflets . Mild regurgitation. - Left atrium: The atrium was mildly to moderately dilated. - Right ventricle: The cavity size was mildly dilated. Pacer wire or catheter noted in right ventricle. Systolic function was mildly reduced. - Right atrium: The atrium was mildly dilated. - Tricuspid valve: Mild-moderate regurgitation. Peak RV-RA gradient:79mm Hg (S). - Pulmonary arteries: PA peak pressure: 81mm Hg (S). - Systemic veins: IVC measured 2.5 cm with some respirophasic variation, suggesting RA pressure 15 mmHg. Impressions:  - Mildly dilated LV with mild LV hypertrophy. EF 20-25%, diffuse hypokinesis. Restrictive diastolic function with evidence for elevated LV filling pressure. Mildly dilated RV with mildly decreased systolic function. Moderate pulmonary hypertension.  Cardiac Catheterization Procedure Note  Name: [name]  MRN: [id number]  DOB: 11/21/1945  Procedure: Right Heart Cath, Left Heart Cath, Selective Coronary Angiography, LV angiography  Indication: 68 yo BM with new onset CHF. Echo shows EF 20-25%.  Procedural Details: The right radial site was prepped, draped, and anesthetized with 1% lidocaine. Using the  modified Seldinger technique a 5 French sheath was placed in the right radial artery and a 5 French sheath was placed in the right brachial vein. A Swan-Ganz catheter was used for the right heart catheterization. Standard protocol was followed for recording of right heart pressures and sampling of oxygen saturations. Fick cardiac output was calculated. Standard Judkins catheters were used for selective coronary angiography and left ventriculography. There were no immediate procedural complications. The patient was transferred to the post catheterization recovery area for further monitoring.  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  Left mainstem: Normal  Left anterior descending (LAD): large vessel with 20-30% disease in the mid vessel. Several diagonal branches are normal.  Left circumflex (LCx): large vessel giving off 3 OMs. Mild disease proximally to 20%.  Right coronary artery (RCA): Arises anomalously from the LCA. Incompletely visualized but vessel is large without significant disease.  Left ventriculography: Not done.  Final Conclusions:  1. 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  3. Moderate pulmonary HTN with elevated filling pressures.  Recommendations: Will treat medically. Patient is intolerant of ARB/ACEi due to ARF and hyperkalemia. Will optimize therapy with nitrates, hydralazine, lasix, and carvedilol.  Theron Aristaeter [facility] LexingtonJordanMD,FACC  08/16/2013, 11:39 AM  Assessment / Plan: 1. Chronic systolic congestive heart failure. Ejection fraction of 20-25%. Etiology is unclear. Nonischemic. This is new  compared to 2010 when LV function was normal. He is intolerant to ACE inhibitors/ARBs related to hyperkalemia and acute renal failure. We'll continue with his current diuretic therapy, long-acting nitrates and hydralazine. We will increase carvedilol to 6.25 mg bid. I will follow up in 4 weeks and we will continue to titrate meds as tolerated. We will plan on repeating an Echo in 3 months. If his LV function remains poor we will consider upgrading his pacemaker to an ICD.  2. CKD stage 3.   3 .HTN- control has been satisfactory. Recent reduction in his amlodipine dose.  4. Complete heart block status post permanent pacemaker.  5. 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 Gout. Patient plans on taking an herbal remedy. I told him that is this doesn't work we can try colchicine.

## 2013-09-14 ENCOUNTER — Telehealth: Payer: Self-pay | Admitting: Cardiology

## 2013-09-14 DIAGNOSIS — I10 Essential (primary) hypertension: Secondary | ICD-10-CM

## 2013-09-14 DIAGNOSIS — I5022 Chronic systolic (congestive) heart failure: Secondary | ICD-10-CM

## 2013-09-14 DIAGNOSIS — M109 Gout, unspecified: Secondary | ICD-10-CM

## 2013-09-14 NOTE — Telephone Encounter (Signed)
Returned call to patient he stated he still has gout in left wrist.Stated he saw Dr.[name] 09/12/13 and Dr.[name] told him to call back if he continues to have pain.Stated he continues to have gout pain in left wrist.Message sent to Dr.[name].

## 2013-09-14 NOTE — Telephone Encounter (Signed)
New message ° ° ° ° ° ° ° ° ° °Pt returning nurses call °

## 2013-09-15 MED ORDER — METHYLPREDNISOLONE (PAK) 4 MG PO TABS: ORAL_TABLET | ORAL | Status: DC

## 2013-09-15 NOTE — Telephone Encounter (Signed)
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

## 2013-09-15 NOTE — Telephone Encounter (Signed)
I would recommend a medrol dose pack. Would check uric acid level with next blood draw.  Peter Swazil[place] MD, [facility]

## 2013-09-15 NOTE — Addendum Note (Signed)
Addended by: Meda Klinefelter D on: 09/15/2013 12:40 PM   Modules accepted: Orders

## 2013-09-19 ENCOUNTER — Telehealth: Payer: Self-pay | Admitting: Cardiology

## 2013-09-19 NOTE — Telephone Encounter (Signed)
Returned call to patient he stated he still has gout in left wrist.Stated pain is better but wrist is still swollen.States has 2 more days of prednisone dose pack.Stated will need a note to go back to work.Also wants to know if Dr.[name] can change lasix to a different diuretic.

## 2013-09-19 NOTE — Telephone Encounter (Signed)
New Problem:  Pt is requesting a call back from [place]. Pt states he will give more details when the nurse calls him back.

## 2013-09-20 NOTE — Telephone Encounter (Signed)
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

## 2013-10-17 ENCOUNTER — Other Ambulatory Visit: Payer: BC Managed Care – PPO

## 2013-10-17 ENCOUNTER — Encounter: Payer: Self-pay | Admitting: Cardiology

## 2013-10-17 ENCOUNTER — Ambulatory Visit (INDEPENDENT_AMBULATORY_CARE_PROVIDER_SITE_OTHER): Payer: BC Managed Care – PPO | Admitting: Cardiology

## 2013-10-17 VITALS — BP 138/86 | HR 93 | Ht 69.0 in | Wt 224.0 lb

## 2013-10-17 DIAGNOSIS — I509 Heart failure, unspecified: Secondary | ICD-10-CM

## 2013-10-17 DIAGNOSIS — N183 Chronic kidney disease, stage 3 unspecified: Secondary | ICD-10-CM

## 2013-10-17 DIAGNOSIS — I442 Atrioventricular block, complete: Secondary | ICD-10-CM

## 2013-10-17 DIAGNOSIS — M109 Gout, unspecified: Secondary | ICD-10-CM

## 2013-10-17 DIAGNOSIS — I10 Essential (primary) hypertension: Secondary | ICD-10-CM

## 2013-10-17 DIAGNOSIS — I5022 Chronic systolic (congestive) heart failure: Secondary | ICD-10-CM

## 2013-10-17 MED ORDER — ISOSORBIDE MONONITRATE ER 60 MG PO TB24: 60.0000 mg | ORAL_TABLET | Freq: Every day | ORAL | Status: DC

## 2013-10-17 MED ORDER — CARVEDILOL 12.5 MG PO TABS: 12.5000 mg | ORAL_TABLET | Freq: Two times a day (BID) | ORAL | Status: DC

## 2013-10-17 NOTE — Progress Notes (Signed)
Gregory BrownerMaurice M Bridges Date of Birth: 07/24/1946   History of Present Illness: [name] is seen today for followup of congestive heart failure. He has a history of complete heart block and presented in September of 2010 with a heart rate of 10. He had a permanent pacemaker placed at that time. He did have acute renal failure and hyperkalemia at the time. Since then his creatinine has decreased to 1.3. He was on either an ACE inhibitor or ARB at that time. In November he presented with worsening CHF.  Echocardiogram showed marked LV dysfunction with ejection fraction of 20-25%. He was started on nitrates and hydralazine. He underwent right and left heart cath on 08/16/13 as noted below. We have been titrating his heart failure meds. His lasix was held due to a gout flair. His gout is improved. He does have increased weight and swelling. He reports the swelling goes down at night.  Current Outpatient Prescriptions on File Prior to Visit  Medication Sig Dispense Refill  . albuterol (PROVENTIL HFA;VENTOLIN HFA) 108 (90 BASE) MCG/ACT inhaler Inhale 2 puffs into the lungs every 6 (six) hours as needed for wheezing or shortness of breath.      Marland Kitchen. amLODipine (NORVASC) 5 MG tablet Take 5 mg by mouth daily.      . Ascorbic Acid (VITAMIN C) 1000 MG tablet Take 2,000 mg by mouth every Monday, Wednesday, and Friday.      Marland Kitchen. aspirin 81 MG tablet Take 81 mg by mouth daily.        . Fluticasone-Salmeterol (ADVAIR) 250-50 MCG/DOSE AEPB Inhale 1 puff into the lungs as needed.      Marland Kitchen. GARLIC PO Take 1 tablet by mouth daily.       . Glucosamine HCl (GLUCOSAMINE PO) Take 1 tablet by mouth daily.       . hydrALAZINE (APRESOLINE) 25 MG tablet Take 1 tablet (25 mg total) by mouth 3 (three) times daily.  100 tablet  6  . Testosterone (AXIRON) 30 MG/ACT SOLN Place 1 application onto the skin daily.       . Vitamin D, Ergocalciferol, (DRISDOL) 50000 UNITS CAPS capsule Take 50,000 Units by mouth 2 (two) times a week. Take on  tuesdays and fridays       No current facility-administered medications on file prior to visit.    Allergies  Allergen Reactions  . Avalide [Irbesartan-Hydrochlorothiazide]   . Penicillins   . Tinactin [Tolnaftate]     Past Medical History  Diagnosis Date  . Complete heart block     s/p PPM by Dr Reyes IvanKersey  . Chronic renal insufficiency     WITH BASELINE CREATININE OF 1.6  . Hypertension   . Coronary disease     NONOBSTRUCTIVE  . Hyperkalemia     Past Surgical History  Procedure Laterality Date  . Pacemaker insertion  05/20/2009    MDT by Dr Reyes IvanKersey for complete heart block    History  Smoking status  . Former Smoker  . Quit date: 03/18/1990  Smokeless tobacco  . Not on file    History  Alcohol Use  . Yes    History reviewed. No pertinent family history.  Review of Systems:  As note in HPI. All other systems were reviewed and are negative.  Physical Exam: BP 138/86  Pulse 93  Ht 5\' 9"  (1.753 m)  Wt 224 lb (101.606 kg)  BMI 33.06 kg/m2  SpO2 98% Weight is up 5 lbs. He is an overweight black male in no  acute distress. His HEENT exam is unremarkable. He has no JVD or bruits. Lungs are clear. Cardiac exam reveals a regular rate and rhythm. Normal S1 and S2. He has no gallop. His pacemaker site is normal. Abdomen is soft and nontender without masses or bruits. He has good pedal pulses. He has 1+ edema.  His left hand is swollen and mildly tender to touch. Skin is warm and dry. He is alert and oriented x3. Cranial nerves II through XII are intact.  LABORATORY DATA: Lab Results  Component Value Date   WBC 6.0 08/11/2013   HGB 13.2 08/11/2013   HCT 40.1 08/11/2013   PLT 189.0 08/11/2013   GLUCOSE 103* 08/11/2013   CHOL 162 07/28/2013   TRIG 54.0 07/28/2013   HDL 47.30 07/28/2013   LDLCALC 104* 07/28/2013   ALT 44 07/28/2013   AST 33 07/28/2013   NA 139 08/11/2013   K 4.5 08/11/2013   CL 104 08/11/2013   CREATININE 1.7* 08/11/2013   BUN 36* 08/11/2013   CO2 32  08/11/2013   TSH 1.67 07/28/2013   INR 1.1* 08/11/2013   Echo:Study Conclusions  - Left ventricle: The cavity size was mildly dilated. Wall thickness was increased in a pattern of mild LVH. Systolic function was severely reduced. The estimated ejection fraction was in the range of 20% to 25%. Diffuse hypokinesis. Doppler parameters are consistent with restrictive physiology, indicative of decreased left ventricular diastolic compliance and/or increased left atrial pressure. E/medial e' > 15 suggests LV end diastolic pressure at least 20 mmHg. - Aortic valve: There was no stenosis. - Mitral valve: Mildly calcified annulus. Normal thickness leaflets . Mild regurgitation. - Left atrium: The atrium was mildly to moderately dilated. - Right ventricle: The cavity size was mildly dilated. Pacer wire or catheter noted in right ventricle. Systolic function was mildly reduced. - Right atrium: The atrium was mildly dilated. - Tricuspid valve: Mild-moderate regurgitation. Peak RV-RA gradient:36mm Hg (S). - Pulmonary arteries: PA peak pressure: 68mm Hg (S). - Systemic veins: IVC measured 2.5 cm with some respirophasic variation, suggesting RA pressure 15 mmHg. Impressions:  - Mildly dilated LV with mild LV hypertrophy. EF 20-25%, diffuse hypokinesis. Restrictive diastolic function with evidence for elevated LV filling pressure. Mildly dilated RV with mildly decreased systolic function. Moderate pulmonary hypertension.  Cardiac Catheterization Procedure Note  Name: [name]  MRN: [id number]  DOB: 1946/01/16  Procedure: Right Heart Cath, Left Heart Cath, Selective Coronary Angiography, LV angiography  Indication: 68 yo BM with new onset CHF. Echo shows EF 20-25%.  Procedural Details: The right radial site was prepped, draped, and anesthetized with 1% lidocaine. Using the modified Seldinger technique a 5 French sheath was placed in the right radial artery and a 5 French sheath was  placed in the right brachial vein. A Swan-Ganz catheter was used for the right heart catheterization. Standard protocol was followed for recording of right heart pressures and sampling of oxygen saturations. Fick cardiac output was calculated. Standard Judkins catheters were used for selective coronary angiography and left ventriculography. There were no immediate procedural complications. The patient was transferred to the post catheterization recovery area for further monitoring.  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  Left mainstem: Normal  Left anterior descending (LAD): large vessel with 20-30% disease in the mid vessel. Several diagonal branches are normal.  Left circumflex (LCx): large vessel giving off 3 OMs. Mild disease proximally to 20%.  Right coronary artery (RCA): Arises anomalously from the LCA. Incompletely visualized but vessel is large without significant disease.  Left ventriculography: Not done.  Final Conclusions:  1. 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  3. Moderate pulmonary HTN with elevated filling pressures.  Recommendations: Will treat medically. Patient is intolerant of ARB/ACEi due to ARF and hyperkalemia. Will optimize therapy with nitrates, hydralazine, lasix, and carvedilol.  Theron Arista [facility]  08/16/2013, 11:39 AM  Assessment / Plan: 1. Chronic systolic congestive heart failure. Ejection fraction of 20-25%. Etiology is unclear. Nonischemic. This is new compared to 2010 when LV function was normal. He is intolerant to ACE inhibitors/ARBs related to hyperkalemia  and acute renal failure. We'll resume lasix 20 mg daily. I will increase  long-acting nitrates to 60 mg Imdur daily. Continue  hydralazine. We will increase carvedilol to 12.5 mg bid. I will follow up in 4 weeks and we will repeat an Echo at that time. If his LV function remains poor we will consider upgrading his pacemaker to an ICD.  2. CKD stage 3.   3 .HTN- control has been satisfactory.   4. Complete heart block status post permanent pacemaker.  5. 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 Gout. Improved now. He reports uric acid levels were OK.

## 2013-10-17 NOTE — Patient Instructions (Addendum)
Increase carvedilol to 12.5 mg twice a day.  Increase isosorbide to 60 mg daily   I will see you in one month and repeat Echo   Restart Lasix 20 mg daily

## 2013-10-18 LAB — BASIC METABOLIC PANEL
BUN: 27 mg/dL — ABNORMAL HIGH (ref 6–23)
CO2: 27 mEq/L (ref 19–32)
Calcium: 8.7 mg/dL (ref 8.4–10.5)
Chloride: 105 mEq/L (ref 96–112)
Creatinine, Ser: 1.9 mg/dL — ABNORMAL HIGH (ref 0.4–1.5)
GFR: 45.89 mL/min — ABNORMAL LOW (ref >60.00)
Glucose, Bld: 93 mg/dL (ref 70–99)
Potassium: 4.7 mEq/L (ref 3.5–5.1)
Sodium: 137 mEq/L (ref 135–145)

## 2013-10-18 LAB — URIC ACID: Uric Acid, Serum: 8.6 mg/dL — ABNORMAL HIGH (ref 4.0–7.8)

## 2013-11-24 ENCOUNTER — Encounter: Payer: Self-pay | Admitting: *Deleted

## 2013-12-05 ENCOUNTER — Ambulatory Visit (INDEPENDENT_AMBULATORY_CARE_PROVIDER_SITE_OTHER): Payer: BC Managed Care – PPO | Admitting: Cardiology

## 2013-12-05 ENCOUNTER — Encounter: Payer: Self-pay | Admitting: Cardiology

## 2013-12-05 ENCOUNTER — Other Ambulatory Visit: Payer: Self-pay

## 2013-12-05 ENCOUNTER — Ambulatory Visit (HOSPITAL_COMMUNITY): Payer: BC Managed Care – PPO | Attending: Internal Medicine | Admitting: Cardiology

## 2013-12-05 VITALS — BP 122/80 | HR 70 | Ht 69.0 in | Wt 227.0 lb

## 2013-12-05 DIAGNOSIS — I5022 Chronic systolic (congestive) heart failure: Secondary | ICD-10-CM

## 2013-12-05 DIAGNOSIS — I509 Heart failure, unspecified: Secondary | ICD-10-CM

## 2013-12-05 DIAGNOSIS — I10 Essential (primary) hypertension: Secondary | ICD-10-CM

## 2013-12-05 DIAGNOSIS — N183 Chronic kidney disease, stage 3 unspecified: Secondary | ICD-10-CM

## 2013-12-05 DIAGNOSIS — R0609 Other forms of dyspnea: Secondary | ICD-10-CM | POA: Insufficient documentation

## 2013-12-05 DIAGNOSIS — I442 Atrioventricular block, complete: Secondary | ICD-10-CM

## 2013-12-05 DIAGNOSIS — I129 Hypertensive chronic kidney disease with stage 1 through stage 4 chronic kidney disease, or unspecified chronic kidney disease: Secondary | ICD-10-CM | POA: Insufficient documentation

## 2013-12-05 DIAGNOSIS — R0989 Other specified symptoms and signs involving the circulatory and respiratory systems: Secondary | ICD-10-CM | POA: Insufficient documentation

## 2013-12-05 DIAGNOSIS — R0600 Dyspnea, unspecified: Secondary | ICD-10-CM

## 2013-12-05 DIAGNOSIS — M109 Gout, unspecified: Secondary | ICD-10-CM | POA: Insufficient documentation

## 2013-12-05 MED ORDER — FUROSEMIDE 40 MG PO TABS: 40.0000 mg | ORAL_TABLET | Freq: Every day | ORAL | Status: DC

## 2013-12-05 NOTE — Progress Notes (Signed)
Limited echo performed. 

## 2013-12-05 NOTE — Progress Notes (Signed)
[name] Date of Birth: [birth year]   History of Present Illness: [name] is seen today for followup of congestive heart failure. He has a history of complete heart block and presented in September of 2010 with a heart rate of 10. He had a permanent pacemaker placed at that time. He did have acute renal failure and hyperkalemia at the time. Since then his creatinine has decreased to 1.3. He was on either an ACE inhibitor or ARB at that time. In November he presented with worsening CHF.  Echocardiogram showed marked LV dysfunction with ejection fraction of 20-25%. He was started on nitrates and hydralazine. He underwent right and left heart cath on 08/16/13 as noted below. We have been titrating his heart failure meds. His lasix was reduced due to a gout flair. His gout is improved. He does have increased weight and swelling. He reports the swelling goes down at night. Reports compliance with low sodium diet.  Current Outpatient Prescriptions on File Prior to Visit  Medication Sig Dispense Refill  . albuterol (PROVENTIL HFA;VENTOLIN HFA) 108 (90 BASE) MCG/ACT inhaler Inhale 2 puffs into the lungs every 6 (six) hours as needed for wheezing or shortness of breath.      Marland Kitchen amLODipine (NORVASC) 5 MG tablet Take 5 mg by mouth daily.      . Ascorbic Acid (VITAMIN C) 1000 MG tablet Take 2,000 mg by mouth every Monday, Wednesday, and Friday.      Marland Kitchen aspirin 81 MG tablet Take 81 mg by mouth daily.        . carvedilol (COREG) 12.5 MG tablet Take 1 tablet (12.5 mg total) by mouth 2 (two) times daily.  180 tablet  3  . Colchicine 0.6 MG CAPS       . Fluticasone-Salmeterol (ADVAIR) 250-50 MCG/DOSE AEPB Inhale 1 puff into the lungs as needed.      Marland Kitchen GARLIC PO Take 1 tablet by mouth daily.       . Glucosamine HCl (GLUCOSAMINE PO) Take 1 tablet by mouth daily.       . hydrALAZINE (APRESOLINE) 25 MG tablet Take 1 tablet (25 mg total) by mouth 3 (three) times daily.  100 tablet  6  . isosorbide mononitrate  (IMDUR) 60 MG 24 hr tablet Take 1 tablet (60 mg total) by mouth daily.  90 tablet  3  . Testosterone (AXIRON) 30 MG/ACT SOLN Place 1 application onto the skin daily.       . XODOL 5-300 MG TABS Take 1 tablet by mouth daily.        No current facility-administered medications on file prior to visit.    Allergies  Allergen Reactions  . Avalide [Irbesartan-Hydrochlorothiazide]   . Penicillins   . Tinactin [Tolnaftate]     Past Medical History  Diagnosis Date  . Complete heart block     s/p PPM by Dr Reyes Ivan  . Chronic renal insufficiency     WITH BASELINE CREATININE OF 1.6  . Hypertension   . Coronary disease     NONOBSTRUCTIVE  . Hyperkalemia     Past Surgical History  Procedure Laterality Date  . Pacemaker insertion  05/20/2009    MDT by Dr Reyes Ivan for complete heart block    History  Smoking status  . Former Smoker  . Quit date: 03/18/1990  Smokeless tobacco  . Not on file    History  Alcohol Use  . Yes    History reviewed. No pertinent family history.  Review of Systems:  As note in HPI. All other systems were reviewed and are negative.  Physical Exam: BP 122/80  Pulse 70  Ht 5\' 9"  (1.753 m)  Wt 227 lb (102.967 kg)  BMI 33.51 kg/m2 Weight is up 5 lbs. He is an overweight black male in no acute distress. His HEENT exam is unremarkable. He has no JVD or bruits. Lungs are clear. Cardiac exam reveals a regular rate and rhythm. Normal S1 and S2. He has no gallop. His pacemaker site is normal. Abdomen is soft and nontender without masses or bruits. He has good pedal pulses. He has 1+ edema on the right and 2+ on the left.  Skin is warm and dry. He is alert and oriented x3. Cranial nerves II through XII are intact.  LABORATORY DATA: Lab Results  Component Value Date   WBC 6.0 08/11/2013   HGB 13.2 08/11/2013   HCT 40.1 08/11/2013   PLT 189.0 08/11/2013   GLUCOSE 93 10/17/2013   CHOL 162 07/28/2013   TRIG 54.0 07/28/2013   HDL 47.30 07/28/2013   LDLCALC 104*  07/28/2013   ALT 44 07/28/2013   AST 33 07/28/2013   NA 137 10/17/2013   K 4.7 10/17/2013   CL 105 10/17/2013   CREATININE 1.9* 10/17/2013   BUN 27* 10/17/2013   CO2 27 10/17/2013   TSH 1.67 07/28/2013   INR 1.1* 08/11/2013   Echo:Study Conclusions; 3.30/15 Study Conclusions  - Left ventricle: The cavity size was moderately dilated. Wall thickness was increased in a pattern of mild LVH. Systolic function was severely reduced. The estimated ejection fraction was in the range of 20% to 25%. Diffuse hypokinesis. There is akinesis of the anteroseptal and apical myocardium. There is akinesis of the inferior myocardium. - Left atrium: The atrium was mildly dilated. Impressions:  - Limited study to assess LV function; doppler not performed. Compared to 07/22/13, LV function remains severely reduced. Limited study to assess LV function; doppler not performed.    Cardiac Catheterization Procedure Note  Name: Gregory BrownerMaurice M Bridges  MRN: [id number]  DOB: 03/24/1946  Procedure: Right Heart Cath, Left Heart Cath, Selective Coronary Angiography, LV angiography  Indication: 68 yo BM with new onset CHF. Echo shows EF 20-25%.  Procedural Details: The right radial site was prepped, draped, and anesthetized with 1% lidocaine. Using the modified Seldinger technique a 5 French sheath was placed in the right radial artery and a 5 French sheath was placed in the right brachial vein. A Swan-Ganz catheter was used for the right heart catheterization. Standard protocol was followed for recording of right heart pressures and sampling of oxygen saturations. Fick cardiac output was calculated. Standard Judkins catheters were used for selective coronary angiography and left ventriculography. There were no immediate procedural complications. The patient was transferred to the post catheterization recovery area for further monitoring.  Procedural Findings:  Hemodynamics  RA 20/17 mean 15 mm Hg  RV 48/13 mm Hg  PA 56/29 mean  39 mm Hg  PCWP 32/31 mean 28 mm Hg  LV 106/26 mm Hg  AO 105/70 mean 85 mm Hg  Oxygen saturations:  PA 62%  AO 89%  Cardiac Output (Fick) 5.95 L/min  Cardiac Index (Fick) 2.74 L/min/m2  Coronary angiography:  Coronary dominance: The RCA has an anomalous takeoff from the left coronary origin.  Left mainstem: Normal  Left anterior descending (LAD): large vessel with 20-30% disease in the mid vessel. Several diagonal branches are normal.  Left circumflex (LCx): large vessel giving off 3 OMs. Mild disease  proximally to 20%.  Right coronary artery (RCA): Arises anomalously from the LCA. Incompletely visualized but vessel is large without significant disease.  Left ventriculography: Not done.  Final Conclusions:  1. 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  3. Moderate pulmonary HTN with elevated filling pressures.  Recommendations: Will treat medically. Patient is intolerant of ARB/ACEi due to ARF and hyperkalemia. Will optimize therapy with nitrates, hydralazine, lasix, and carvedilol.  [name] [facility]  08/16/2013, 11:39 AM  Ecg: NSR with A sensing and V pacing.  Assessment / Plan: 1. Chronic systolic congestive heart failure. Ejection fraction of 20-25%. No improvement in Echo despite optimal medical therapy. Etiology is unclear. Nonischemic.  He is intolerant to ACE inhibitors/ARBs related to hyperkalemia and acute renal failure. Continue coreg, hydralazine, nitrates. Increase lasix to 40 mg daily.  Will refer to Dr. Johney Frame for upgrade in pacemaker to Biv/ ICD.  2. CKD stage 3.   3 .HTN- control has been satisfactory.   4. Complete heart block status post permanent pacemaker.  5. 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 Gout. Improved now.

## 2013-12-05 NOTE — Patient Instructions (Signed)
Increase you lasix to 40 mg daily  Continue your other therapy  We will get you an appt. To see Dr. Johney Frame concerning upgrading your pacemaker to a defibrillator/ biventricular pacemaker.  Restrict your salt intake.  I will see you in 3 months.

## 2013-12-16 ENCOUNTER — Encounter: Payer: Self-pay | Admitting: Internal Medicine

## 2013-12-16 ENCOUNTER — Ambulatory Visit (INDEPENDENT_AMBULATORY_CARE_PROVIDER_SITE_OTHER): Payer: BC Managed Care – PPO | Admitting: Internal Medicine

## 2013-12-16 VITALS — BP 126/75 | HR 72 | Ht 69.0 in | Wt 222.0 lb

## 2013-12-16 DIAGNOSIS — I442 Atrioventricular block, complete: Secondary | ICD-10-CM

## 2013-12-16 DIAGNOSIS — I509 Heart failure, unspecified: Secondary | ICD-10-CM

## 2013-12-16 DIAGNOSIS — I5022 Chronic systolic (congestive) heart failure: Secondary | ICD-10-CM

## 2013-12-16 DIAGNOSIS — I10 Essential (primary) hypertension: Secondary | ICD-10-CM

## 2013-12-16 LAB — MDC_IDC_ENUM_SESS_TYPE_INCLINIC
Battery Impedance: 254 Ohm
Battery Remaining Longevity: 104 mo
Battery Voltage: 2.78 V
Brady Statistic AP VS Percent: 0 %
Date Time Interrogation Session: 20150410095719
Lead Channel Pacing Threshold Amplitude: 0.75 V
Lead Channel Pacing Threshold Amplitude: 0.75 V
Lead Channel Pacing Threshold Pulse Width: 0.4 ms
Lead Channel Setting Pacing Amplitude: 2 V
Lead Channel Setting Pacing Amplitude: 2.5 V
Lead Channel Setting Pacing Pulse Width: 0.4 ms
Lead Channel Setting Sensing Sensitivity: 4 mV
MDC IDC MSMT LEADCHNL RA IMPEDANCE VALUE: 448 Ohm
MDC IDC MSMT LEADCHNL RA PACING THRESHOLD PULSEWIDTH: 0.4 ms
MDC IDC MSMT LEADCHNL RA SENSING INTR AMPL: 1.4 mV
MDC IDC MSMT LEADCHNL RV IMPEDANCE VALUE: 552 Ohm
MDC IDC STAT BRADY AP VP PERCENT: 1 %
MDC IDC STAT BRADY AS VP PERCENT: 97 %
MDC IDC STAT BRADY AS VS PERCENT: 2 %

## 2013-12-16 NOTE — Patient Instructions (Signed)
Your physician wants you to follow-up in: 6 months in the device clinic and 12 months with Dr Allred You will receive a reminder letter in the mail two months in advance. If you don't receive a letter, please call our office to schedule the follow-up appointment.  

## 2013-12-17 NOTE — Progress Notes (Signed)
PCP: [name], MD Primary Cardiologist:  Dr [place]  [name] is a 68 y.o. male who presents today for electrophysiology follow-up and to discuss upgrade to CRT-D.  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  He was hospitalized in November for CHF.  Cath and Dr Illa Level notes are reviewed.   Today, he denies symptoms of palpitations, chest pain, shortness of breath,  lower extremity edema, dizziness, presyncope, or syncope.  The patient is otherwise without complaint today.   Past Medical History  Diagnosis Date  . Complete heart block     s/p PPM by Dr Reyes Ivan  . Chronic renal insufficiency     WITH BASELINE CREATININE OF 1.6  . Hypertension   . Coronary disease     NONOBSTRUCTIVE  . Hyperkalemia    Past Surgical History  Procedure Laterality Date  . Pacemaker insertion  05/20/2009    MDT by Dr Reyes Ivan for complete heart block    Current Outpatient Prescriptions  Medication Sig Dispense Refill  . albuterol (PROVENTIL HFA;VENTOLIN HFA) 108 (90 BASE) MCG/ACT inhaler Inhale 2 puffs into the lungs every 6 (six) hours as needed for wheezing or shortness of breath.      Marland Kitchen amLODipine (NORVASC) 5 MG tablet Take 5 mg by mouth daily.      . Ascorbic Acid (VITAMIN C) 1000 MG tablet Take 2,000 mg by mouth every Monday, Wednesday, and Friday.      Marland Kitchen aspirin 81 MG tablet Take 81 mg by mouth daily.        . carvedilol (COREG) 12.5 MG tablet Take 1 tablet (12.5 mg total) by mouth 2 (two) times daily.  180 tablet  3  . Cholecalciferol (VITAMIN D-3) 1000 UNITS CAPS Take 1,000 Units by mouth daily.      . Colchicine 0.6 MG CAPS       . Fluticasone-Salmeterol (ADVAIR) 250-50 MCG/DOSE AEPB Inhale 1 puff into the lungs as needed.      . furosemide (LASIX) 40 MG tablet Take 1 tablet (40 mg total) by mouth daily.  30 tablet  6  . GARLIC PO Take 1 tablet by mouth daily.        . Glucosamine HCl (GLUCOSAMINE PO) Take 1 tablet by mouth daily.       . hydrALAZINE (APRESOLINE) 25 MG tablet Take 1 tablet (25 mg total) by mouth 3 (three) times daily.  100 tablet  6  . isosorbide mononitrate (IMDUR) 60 MG 24 hr tablet Take 1 tablet (60 mg total) by mouth daily.  90 tablet  3  . Testosterone (AXIRON) 30 MG/ACT SOLN Place 1 application onto the skin daily.       . XODOL 5-300 MG TABS Take 1 tablet by mouth daily.        No current facility-administered medications for this visit.    Physical Exam: Filed Vitals:   12/16/13 0928  BP: 126/75  Pulse: 72  Height: 5\' 9"  (1.753 m)  Weight: 222 lb (100.699 kg)    GEN- The patient is well appearing, alert and oriented x 3 today.   Head- normocephalic, atraumatic Eyes-  Sclera clear, conjunctiva pink Ears- hearing intact Oropharynx- clear Lungs- Clear to ausculation bilaterally, normal work of breathing Chest- pacemaker pocket is well healed Heart- Regular rate and rhythm, no murmurs, rubs or gallops, PMI not laterally displaced GI- soft, NT,  ND, + BS Extremities- no clubbing, cyanosis, or edema  Pacemaker interrogation- reviewed in detail today,  See PACEART report  Assessment and Plan:  1. Complete heart block Normal pacemaker function See Pace Art report No changes today  2. HTN Stable No change required today  3. Nonischemic cardiomyopathy/ chronic systolic dysfunction I share Dr Illa LevelJordans thoughts that he would be a good candidate for upgrade to CRT or CRT-D device.  I would anticipate that his EF would significantly improve and that he would feel better.  He V paces 100%. Unfortunately, he is very clear in his decision to decline any further procedures at this time.  He says that his energy is good and that in the absence of further symptoms that he has no desire to upgrade his device.  I have encouraged him to consider upgrade and to contact my office should he decide to proceed.  I have discouraged him  from high risk work related activity and have recommended that he consider other work options.  Follow-up with Dr [place] as scheduled I will see again in 1 year   Return to the device clinic in 6 months. I will see again in 1 year

## 2014-03-03 ENCOUNTER — Encounter (INDEPENDENT_AMBULATORY_CARE_PROVIDER_SITE_OTHER): Payer: Self-pay

## 2014-03-03 ENCOUNTER — Ambulatory Visit (INDEPENDENT_AMBULATORY_CARE_PROVIDER_SITE_OTHER): Payer: BC Managed Care – PPO | Admitting: Cardiology

## 2014-03-03 ENCOUNTER — Encounter: Payer: Self-pay | Admitting: Cardiology

## 2014-03-03 VITALS — BP 156/78 | HR 96 | Ht 69.0 in | Wt 228.0 lb

## 2014-03-03 DIAGNOSIS — I10 Essential (primary) hypertension: Secondary | ICD-10-CM

## 2014-03-03 DIAGNOSIS — I509 Heart failure, unspecified: Secondary | ICD-10-CM

## 2014-03-03 DIAGNOSIS — I442 Atrioventricular block, complete: Secondary | ICD-10-CM

## 2014-03-03 DIAGNOSIS — I5022 Chronic systolic (congestive) heart failure: Secondary | ICD-10-CM

## 2014-03-03 DIAGNOSIS — N183 Chronic kidney disease, stage 3 unspecified: Secondary | ICD-10-CM

## 2014-03-03 LAB — BASIC METABOLIC PANEL
BUN: 24 mg/dL — ABNORMAL HIGH (ref 6–23)
CO2: 25 mEq/L (ref 19–32)
CREATININE: 1.5 mg/dL (ref 0.4–1.5)
Calcium: 8.9 mg/dL (ref 8.4–10.5)
Chloride: 104 mEq/L (ref 96–112)
GFR: 59.95 mL/min — ABNORMAL LOW (ref >60.00)
Glucose, Bld: 94 mg/dL (ref 70–99)
Potassium: 3.9 mEq/L (ref 3.5–5.1)
Sodium: 138 mEq/L (ref 135–145)

## 2014-03-03 LAB — BRAIN NATRIURETIC PEPTIDE: PRO B NATRI PEPTIDE: 309 pg/mL — AB (ref 0.0–100.0)

## 2014-03-03 NOTE — Patient Instructions (Signed)
We will check blood work today  Continue your medications  I will see you in 6 months.

## 2014-03-03 NOTE — Progress Notes (Signed)
Vaughan BrownerMaurice M Loch Date of Birth: Nov 28, 1945   History of Present Illness: [name] is seen today for followup of congestive heart failure. He has a history of complete heart block and presented in September of 2010 with a heart rate of 10. He had a permanent pacemaker placed at that time. He did have acute renal failure and hyperkalemia at the time. Since then his creatinine has decreased to 1.3. He was on either an ACE inhibitor or ARB at that time. In November he presented with worsening CHF.  Echocardiogram showed marked LV dysfunction with ejection fraction of 20-25%. He had moderate pulmonary HTN. He was started on nitrates and hydralazine. He underwent right and left heart cath on 08/16/13 as noted below. We have been titrating his heart failure meds. His lasix was reduced due to a gout flair. His gout is improved. He still has some swelling. He reports the swelling goes down at night. Weight has been stable. He states he feels great and when his current medications run out he is going to quit taking them. He declined ICD/CRT.  Current Outpatient Prescriptions on File Prior to Visit  Medication Sig Dispense Refill  . albuterol (PROVENTIL HFA;VENTOLIN HFA) 108 (90 BASE) MCG/ACT inhaler Inhale 2 puffs into the lungs every 6 (six) hours as needed for wheezing or shortness of breath.      Marland Kitchen. amLODipine (NORVASC) 5 MG tablet Take 5 mg by mouth daily.      . Ascorbic Acid (VITAMIN C) 1000 MG tablet Take 2,000 mg by mouth every Monday, Wednesday, and Friday.      Marland Kitchen. aspirin 81 MG tablet Take 81 mg by mouth daily.        . carvedilol (COREG) 12.5 MG tablet Take 1 tablet (12.5 mg total) by mouth 2 (two) times daily.  180 tablet  3  . Cholecalciferol (VITAMIN D-3) 1000 UNITS CAPS Take 1,000 Units by mouth daily.      . Colchicine 0.6 MG CAPS       . Fluticasone-Salmeterol (ADVAIR) 250-50 MCG/DOSE AEPB Inhale 1 puff into the lungs as needed.      . furosemide (LASIX) 40 MG tablet Take 1 tablet (40 mg  total) by mouth daily.  30 tablet  6  . GARLIC PO Take 1 tablet by mouth daily.       . Glucosamine HCl (GLUCOSAMINE PO) Take 1 tablet by mouth daily.       . hydrALAZINE (APRESOLINE) 25 MG tablet Take 1 tablet (25 mg total) by mouth 3 (three) times daily.  100 tablet  6  . isosorbide mononitrate (IMDUR) 60 MG 24 hr tablet Take 1 tablet (60 mg total) by mouth daily.  90 tablet  3  . Testosterone (AXIRON) 30 MG/ACT SOLN Place 1 application onto the skin daily.       . XODOL 5-300 MG TABS Take 1 tablet by mouth daily.        No current facility-administered medications on file prior to visit.    Allergies  Allergen Reactions  . Avalide [Irbesartan-Hydrochlorothiazide]   . Penicillins   . Tinactin [Tolnaftate]     Past Medical History  Diagnosis Date  . Complete heart block     s/p PPM by Dr Reyes IvanKersey  . Chronic renal insufficiency     WITH BASELINE CREATININE OF 1.6  . Hypertension   . Coronary disease     NONOBSTRUCTIVE  . Hyperkalemia   . Chronic systolic CHF (congestive heart failure)   . Nonischemic  cardiomyopathy   . Gout     Past Surgical History  Procedure Laterality Date  . Pacemaker insertion  05/20/2009    MDT by Dr Reyes Ivan for complete heart block    History  Smoking status  . Former Smoker  . Quit date: 03/18/1990  Smokeless tobacco  . Not on file    History  Alcohol Use  . Yes    History reviewed. No pertinent family history.  Review of Systems:  As note in HPI. All other systems were reviewed and are negative.  Physical Exam: BP 156/78  Pulse 96  Ht 5\' 9"  (1.753 m)  Wt 228 lb (103.42 kg)  BMI 33.65 kg/m2 Weight is up 5 lbs. He is an overweight black male in no acute distress. His HEENT exam is unremarkable. He has no JVD or bruits. Lungs are clear. Cardiac exam reveals a regular rate and rhythm. Normal S1 and S2. He has no gallop. His pacemaker site is normal. Abdomen is soft and nontender without masses or bruits. He has good pedal pulses. He has  1+ edema bilaterally.  Skin is warm and dry. He is alert and oriented x3. Cranial nerves II through XII are intact.  LABORATORY DATA: Lab Results  Component Value Date   WBC 6.0 08/11/2013   HGB 13.2 08/11/2013   HCT 40.1 08/11/2013   PLT 189.0 08/11/2013   GLUCOSE 93 10/17/2013   CHOL 162 07/28/2013   TRIG 54.0 07/28/2013   HDL 47.30 07/28/2013   LDLCALC 104* 07/28/2013   ALT 44 07/28/2013   AST 33 07/28/2013   NA 137 10/17/2013   K 4.7 10/17/2013   CL 105 10/17/2013   CREATININE 1.9* 10/17/2013   BUN 27* 10/17/2013   CO2 27 10/17/2013   TSH 1.67 07/28/2013   INR 1.1* 08/11/2013   Echo:Study Conclusions; 3.30/15 Study Conclusions  - Left ventricle: The cavity size was moderately dilated. Wall thickness was increased in a pattern of mild LVH. Systolic function was severely reduced. The estimated ejection fraction was in the range of 20% to 25%. Diffuse hypokinesis. There is akinesis of the anteroseptal and apical myocardium. There is akinesis of the inferior myocardium. - Left atrium: The atrium was mildly dilated. Impressions:  - Limited study to assess LV function; doppler not performed. Compared to 07/22/13, LV function remains severely reduced. Limited study to assess LV function; doppler not performed.    Cardiac Catheterization Procedure Note  Name: CHIMA ASTORINO  MRN: [id number]  DOB: [birth year]  Procedure: Right Heart Cath, Left Heart Cath, Selective Coronary Angiography, LV angiography  Indication: 68 yo BM with new onset CHF. Echo shows EF 20-25%.  Procedural Details: The right radial site was prepped, draped, and anesthetized with 1% lidocaine. Using the modified Seldinger technique a 5 French sheath was placed in the right radial artery and a 5 French sheath was placed in the right brachial vein. A Swan-Ganz catheter was used for the right heart catheterization. Standard protocol was followed for recording of right heart pressures and sampling of oxygen saturations. Fick  cardiac output was calculated. Standard Judkins catheters were used for selective coronary angiography and left ventriculography. There were no immediate procedural complications. The patient was transferred to the post catheterization recovery area for further monitoring.  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  Left mainstem: Normal  Left anterior descending (LAD): large vessel with 20-30% disease in the mid vessel. Several diagonal branches are normal.  Left circumflex (LCx): large vessel giving off 3 OMs. Mild disease proximally to 20%.  Right coronary artery (RCA): Arises anomalously from the LCA. Incompletely visualized but vessel is large without significant disease.  Left ventriculography: Not done.  Final Conclusions:  1. 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  3. Moderate pulmonary HTN with elevated filling pressures.  Recommendations: Will treat medically. Patient is intolerant of ARB/ACEi due to ARF and hyperkalemia. Will optimize therapy with nitrates, hydralazine, lasix, and carvedilol.  Theron Arista [facility]  08/16/2013, 11:39 AM  Assessment / Plan: 1. Chronic systolic congestive heart failure. Ejection fraction of 20-25%. No improvement in Echo despite optimal medical therapy.  Nonischemic.  He is intolerant to ACE inhibitors/ARBs related to hyperkalemia and acute renal failure. Continue coreg, hydralazine, nitrates. Continue lasix to 40 mg daily.  Patient has declined ICD/CRT therapy. I strongly encouraged him to continue his mediations. We discussed the pathophysiology of CHF and the  reasons for his medical treatment. His prognosis is limited as it is. If he stops his medication his prognosis is very poor.   2. CKD stage 3. Will check BMET and BNP today.  3 .HTN- control has been satisfactory.   4. Complete heart block status post permanent pacemaker.  5. 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 Gout.

## 2014-07-05 ENCOUNTER — Encounter: Payer: Self-pay | Admitting: *Deleted

## 2014-07-24 ENCOUNTER — Ambulatory Visit (INDEPENDENT_AMBULATORY_CARE_PROVIDER_SITE_OTHER): Payer: BC Managed Care – PPO | Admitting: *Deleted

## 2014-07-24 DIAGNOSIS — I442 Atrioventricular block, complete: Secondary | ICD-10-CM

## 2014-07-24 LAB — MDC_IDC_ENUM_SESS_TYPE_INCLINIC
Brady Statistic AP VP Percent: 1 %
Brady Statistic AP VS Percent: 0 %
Brady Statistic AS VP Percent: 98 %
Brady Statistic AS VS Percent: 1 %
Date Time Interrogation Session: 20151116170151
Lead Channel Impedance Value: 482 Ohm
Lead Channel Impedance Value: 530 Ohm
Lead Channel Pacing Threshold Amplitude: 1.25 V
Lead Channel Pacing Threshold Pulse Width: 0.4 ms
Lead Channel Sensing Intrinsic Amplitude: 1 mV
Lead Channel Setting Pacing Amplitude: 2.5 V
Lead Channel Setting Sensing Sensitivity: 4 mV
MDC IDC MSMT BATTERY IMPEDANCE: 279 Ohm
MDC IDC MSMT BATTERY REMAINING LONGEVITY: 91 mo
MDC IDC MSMT BATTERY VOLTAGE: 2.78 V
MDC IDC MSMT LEADCHNL RA PACING THRESHOLD AMPLITUDE: 0.75 V
MDC IDC MSMT LEADCHNL RV PACING THRESHOLD PULSEWIDTH: 0.46 ms
MDC IDC SET LEADCHNL RA PACING AMPLITUDE: 2 V
MDC IDC SET LEADCHNL RV PACING PULSEWIDTH: 0.46 ms

## 2014-07-24 NOTE — Progress Notes (Signed)
Pacemaker check in clinic. Normal device function. Thresholds, sensing, impedances consistent with previous measurements. Device programmed to maximize longevity. 12 mode switches all < 1 minute.  1 high ventricular rates noted 5 seconds. Device programmed at appropriate safety margins. Histogram distribution appropriate for patient activity level. Device programmed to optimize intrinsic conduction. Estimated longevity 7.5 years. Patient enrolled in remote follow-up/TTM's with [employer]. Plan to follow every 3 months remotely and see annually in office. Patient education completed.  ROV in April with Dr. Johney Frame.

## 2014-08-02 ENCOUNTER — Encounter: Payer: Self-pay | Admitting: Internal Medicine

## 2014-08-17 ENCOUNTER — Encounter (HOSPITAL_COMMUNITY): Payer: Self-pay | Admitting: Cardiology

## 2014-10-16 ENCOUNTER — Ambulatory Visit (INDEPENDENT_AMBULATORY_CARE_PROVIDER_SITE_OTHER): Payer: BLUE CROSS/BLUE SHIELD | Admitting: Cardiology

## 2014-10-16 ENCOUNTER — Encounter: Payer: Self-pay | Admitting: Cardiology

## 2014-10-16 VITALS — BP 140/74 | HR 60 | Ht 69.0 in | Wt 236.0 lb

## 2014-10-16 DIAGNOSIS — I442 Atrioventricular block, complete: Secondary | ICD-10-CM

## 2014-10-16 DIAGNOSIS — I10 Essential (primary) hypertension: Secondary | ICD-10-CM

## 2014-10-16 DIAGNOSIS — N183 Chronic kidney disease, stage 3 unspecified: Secondary | ICD-10-CM

## 2014-10-16 DIAGNOSIS — I5022 Chronic systolic (congestive) heart failure: Secondary | ICD-10-CM

## 2014-10-16 NOTE — Progress Notes (Signed)
[name] Date of Birth: 11-08-1945   History of Present Illness: [name] is seen today for followup of congestive heart failure. He has a history of complete heart block and presented in September of 2010 with a heart rate of 10. He had a permanent pacemaker placed at that time. He did have acute renal failure and hyperkalemia at the time. Since then his creatinine has decreased to 1.3. He was on either an ACE inhibitor or ARB at that time. In November he presented with worsening CHF.  Echocardiogram showed marked LV dysfunction with ejection fraction of 20-25%. He had moderate pulmonary HTN. He was started on nitrates and hydralazine. He underwent right and left heart cath on 08/16/13 with moderate pulmonary HTN and elevated filling pressures. 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   He declined ICD/CRT. Since his last visit he states he has felt great. He attributes this to the fact that he drinks more than a gallon of green tea daily. He is no longer taking hydralazine or nitrates. Just didn't feel he needed them.   Current Outpatient Prescriptions on File Prior to Visit  Medication Sig Dispense Refill  . albuterol (PROVENTIL HFA;VENTOLIN HFA) 108 (90 BASE) MCG/ACT inhaler Inhale 2 puffs into the lungs every 6 (six) hours as needed for wheezing or shortness of breath.    Marland Kitchen amLODipine (NORVASC) 5 MG tablet Take 5 mg by mouth daily.    . Ascorbic Acid (VITAMIN C) 1000 MG tablet Take 2,000 mg by mouth every Monday, Wednesday, and Friday.    Marland Kitchen aspirin 81 MG tablet Take 81 mg by mouth daily.      . carvedilol (COREG) 12.5 MG tablet Take 1 tablet (12.5 mg total) by mouth 2 (two) times daily. 180 tablet 3  . Cholecalciferol (VITAMIN D-3) 1000 UNITS CAPS Take 1,000 Units by mouth daily.    . Colchicine 0.6 MG CAPS Take 1 tablet by mouth as needed.     . Fluticasone-Salmeterol (ADVAIR) 250-50 MCG/DOSE AEPB Inhale 1 puff into the lungs as needed.    . furosemide (LASIX) 40 MG  tablet Take 1 tablet (40 mg total) by mouth daily. 30 tablet 6  . GARLIC PO Take 1 tablet by mouth daily.     . Glucosamine HCl (GLUCOSAMINE PO) Take 1 tablet by mouth daily.     . XODOL 5-300 MG TABS Take 1 tablet by mouth daily.      No current facility-administered medications on file prior to visit.    Allergies  Allergen Reactions  . Avalide [Irbesartan-Hydrochlorothiazide]   . Penicillins   . Tinactin [Tolnaftate]     Past Medical History  Diagnosis Date  . Complete heart block     s/p PPM by Dr Reyes Ivan  . Chronic renal insufficiency     WITH BASELINE CREATININE OF 1.6  . Hypertension   . Coronary disease     NONOBSTRUCTIVE  . Hyperkalemia   . Chronic systolic CHF (congestive heart failure)   . Nonischemic cardiomyopathy   . Gout     Past Surgical History  Procedure Laterality Date  . Pacemaker insertion  05/20/2009    MDT by Dr Reyes Ivan for complete heart block  . Left and right heart catheterization with coronary angiogram N/A 08/16/2013    Procedure: LEFT AND RIGHT HEART CATHETERIZATION WITH CORONARY ANGIOGRAM;  Surgeon: [name] Swaziland, MD;  Location: [facility] CATH LAB;  Service: Cardiovascular;  Laterality: N/A;    History  Smoking status  .  Former Smoker  . Quit date: 03/18/1990  Smokeless tobacco  . Not on file    History  Alcohol Use  . Yes    History reviewed. No pertinent family history.  Review of Systems:  As note in HPI. All other systems were reviewed and are negative.  Physical Exam: BP 140/74 mmHg  Pulse 60  Ht 5\' 9"  (1.753 m)  Wt 236 lb (107.049 kg)  BMI 34.84 kg/m2 Weight is up 5 lbs. He is an overweight black male in no acute distress. His HEENT exam is unremarkable. He has no JVD or bruits. Lungs are clear. Cardiac exam reveals a regular rate and rhythm. Normal S1 and S2. He has no gallop. His pacemaker site is normal. Abdomen is soft and nontender without masses or bruits. He has good pedal pulses. He has 1+ edema bilaterally.  Skin is warm  and dry. He is alert and oriented x3. Cranial nerves II through XII are intact.  LABORATORY DATA: Lab Results  Component Value Date   WBC 6.0 08/11/2013   HGB 13.2 08/11/2013   HCT 40.1 08/11/2013   PLT 189.0 08/11/2013   GLUCOSE 94 03/03/2014   CHOL 162 07/28/2013   TRIG 54.0 07/28/2013   HDL 47.30 07/28/2013   LDLCALC 104* 07/28/2013   ALT 44 07/28/2013   AST 33 07/28/2013   NA 138 03/03/2014   K 3.9 03/03/2014   CL 104 03/03/2014   CREATININE 1.5 03/03/2014   BUN 24* 03/03/2014   CO2 25 03/03/2014   TSH 1.67 07/28/2013   INR 1.1* 08/11/2013   Ecg today shows NSR with V pacing.  Assessment / Plan: 1. Chronic systolic congestive heart failure. Ejection fraction of 20-25%. No improvement in Echo despite optimal medical therapy.  Nonischemic.  He is intolerant to ACE inhibitors/ARBs related to hyperkalemia and acute renal failure. Continue coreg. Continue lasix to 40 mg daily.  Patient has declined ICD/CRT therapy. He has stopped hydralazine and nitrates despite prior recommendations.   2. CKD stage 3. Labs most recently checked by primary MD.  3 .HTN- control has been satisfactory.   4. Complete heart block status post permanent pacemaker.  5. 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 Gout.

## 2014-10-16 NOTE — Patient Instructions (Signed)
Continue your current therapy  Stay away from salt.  I will see you in 6 months.

## 2014-11-02 ENCOUNTER — Ambulatory Visit: Payer: BC Managed Care – PPO | Admitting: Cardiology

## 2014-12-22 ENCOUNTER — Ambulatory Visit (INDEPENDENT_AMBULATORY_CARE_PROVIDER_SITE_OTHER): Payer: BLUE CROSS/BLUE SHIELD | Admitting: Diagnostic Neuroimaging

## 2014-12-22 ENCOUNTER — Encounter: Payer: Self-pay | Admitting: Diagnostic Neuroimaging

## 2014-12-22 VITALS — BP 132/80 | HR 59 | Ht 69.0 in | Wt 226.0 lb

## 2014-12-22 DIAGNOSIS — R202 Paresthesia of skin: Secondary | ICD-10-CM | POA: Diagnosis not present

## 2014-12-22 DIAGNOSIS — M79641 Pain in right hand: Secondary | ICD-10-CM

## 2014-12-22 DIAGNOSIS — R200 Anesthesia of skin: Secondary | ICD-10-CM

## 2014-12-22 NOTE — Progress Notes (Signed)
[facility]  PATIENT: [name] DOB: December 21, 1945  REFERRING CLINICIAN: R sanders HISTORY FROM: patient  REASON FOR VISIT: new consult   HISTORICAL  CHIEF COMPLAINT:  Chief Complaint  Patient presents with  . New Evaluation    right hand and arm numbness and pain    HISTORY OF PRESENT ILLNESS:   69 year old male with hypertension, heart disease, gout, here for valuation of right hand pain and numbness. Symptoms have been present for past 2 months. He has history of bilateral ulnar nerve transposition surgeries. Left ulnar nerve surgery in 1970s. Right ulnar nerve surgery in 2010. Patient's current symptoms are different than when he had ulnar nerve problems in the past. Patient referred for consultation by Dr. Baird Cancer.  Patient describes intermittent pain, aching, numbness in his right forearm and right palm. Sometimes this wakes him up from sleep at nighttime. He shakes his hands to relieve the symptoms. Sometimes when he is working, he has to shake his hands to relieve symptoms. Pain can range up to 8 out of 10 in severity. No problems with his left hand. No problems with his neck, low back or legs. He denies any tingling or burning sensation.    REVIEW OF SYSTEMS: Full 14 system review of systems performed and notable only for numbness.  ALLERGIES: Allergies  Allergen Reactions  . Avalide [Irbesartan-Hydrochlorothiazide]   . Penicillins   . Tinactin [Tolnaftate]     HOME MEDICATIONS: Outpatient Prescriptions Prior to Visit  Medication Sig Dispense Refill  . amLODipine (NORVASC) 5 MG tablet Take 5 mg by mouth daily.    . Ascorbic Acid (VITAMIN C) 1000 MG tablet Take 2,000 mg by mouth every Monday, Wednesday, and Friday.    Marland Kitchen aspirin 81 MG tablet Take 81 mg by mouth daily.      . carvedilol (COREG) 12.5 MG tablet Take 1 tablet (12.5 mg total) by mouth 2 (two) times daily. 180 tablet 3  . Cholecalciferol (VITAMIN D-3) 1000 UNITS CAPS Take 1,000  Units by mouth daily.    . Colchicine 0.6 MG CAPS Take 1 tablet by mouth as needed.     . Fluticasone-Salmeterol (ADVAIR) 250-50 MCG/DOSE AEPB Inhale 1 puff into the lungs as needed.    Marland Kitchen GARLIC PO Take 1 tablet by mouth daily.     . Glucosamine HCl (GLUCOSAMINE PO) Take 1 tablet by mouth daily.     Marland Kitchen albuterol (PROVENTIL HFA;VENTOLIN HFA) 108 (90 BASE) MCG/ACT inhaler Inhale 2 puffs into the lungs every 6 (six) hours as needed for wheezing or shortness of breath.    . furosemide (LASIX) 40 MG tablet Take 1 tablet (40 mg total) by mouth daily. 30 tablet 6  . XODOL 5-300 MG TABS Take 1 tablet by mouth daily.      No facility-administered medications prior to visit.    PAST MEDICAL HISTORY: Past Medical History  Diagnosis Date  . Complete heart block     s/p PPM by Dr Verlon Setting  . Chronic renal insufficiency     WITH BASELINE CREATININE OF 1.6  . Hypertension   . Coronary disease     NONOBSTRUCTIVE  . Hyperkalemia   . Chronic systolic CHF (congestive heart failure)   . Nonischemic cardiomyopathy   . Gout     PAST SURGICAL HISTORY: Past Surgical History  Procedure Laterality Date  . Pacemaker insertion  05/20/2009    MDT by Dr Verlon Setting for complete heart block  . Left and right heart catheterization with coronary angiogram N/A  08/16/2013    Procedure: LEFT AND RIGHT HEART CATHETERIZATION WITH CORONARY ANGIOGRAM;  Surgeon: [name] Martinique, MD;  Location: [facility] CATH LAB;  Service: Cardiovascular;  Laterality: N/A;    FAMILY HISTORY: History reviewed. No pertinent family history.  SOCIAL HISTORY:  History   Social History  . Marital Status: Single    Spouse Name: N/A  . Number of Children: 1  . Years of Education: 14   Occupational History  . Dealer    Social History Main Topics  . Smoking status: Former Smoker    Quit date: 03/18/1990  . Smokeless tobacco: Not on file  . Alcohol Use: 0.0 oz/week    0 Standard drinks or equivalent per week     Comment: socially   . Drug  Use: No  . Sexual Activity: Not on file   Other Topics Concern  . Not on file   Social History Narrative   L[place] in [place].   Drinks a couple of coffees a day   Lives at home alone with his dog     PHYSICAL EXAM  Filed Vitals:   12/22/14 1047  BP: 132/80  Pulse: 59  Height: _0  (1.753 m)  Weight: 226 lb (102.513 kg)    Body mass index is 33.36 kg/(m^2).   Visual Acuity Screening   Right eye Left eye Both eyes  Without correction: 20/40 20/40   With correction:       No flowsheet data found.  GENERAL EXAM: Patient is in no distress; well developed, nourished and groomed; neck is supple; ARTHRITIS ENLARGEMENT OF RIGHT 1ST MCP JOINT  CARDIOVASCULAR: Regular rate and rhythm, no murmurs, no carotid bruits  NEUROLOGIC: MENTAL STATUS: awake, alert, oriented to person, place and time, recent and remote memory intact, normal attention and concentration, language fluent, comprehension intact, naming intact, fund of knowledge appropriate CRANIAL NERVE: no papilledema on fundoscopic exam, pupils equal and reactive to light, visual fields full to confrontation, extraocular muscles intact, no nystagmus, facial sensation and strength symmetric, hearing intact, palate elevates symmetrically, uvula midline, shoulder shrug symmetric, tongue midline. MOTOR: normal bulk and tone, full strength in the BUE, BLE SENSORY: normal and symmetric to light touch, pinprick, temperature, vibration; NEG TINELS, NEG PHALENS COORDINATION: finger-nose-finger, fine finger movements normal REFLEXES: deep tendon reflexes present and symmetric; TRACE AT ANKLES GAIT/STATION: narrow based gait; able to walk tandem; romberg is negative    DIAGNOSTIC DATA (LABS, IMAGING, TESTING) - I reviewed patient records, labs, notes, testing and imaging myself where available.  Lab Results  Component Value Date   WBC 6.0 08/11/2013   HGB 13.2 08/11/2013   HCT 40.1 08/11/2013   MCV 89.8 08/11/2013   PLT  189.0 08/11/2013      Component Value Date/Time   NA 138 03/03/2014 1429   K 3.9 03/03/2014 1429   CL 104 03/03/2014 1429   CO2 25 03/03/2014 1429   GLUCOSE 94 03/03/2014 1429   BUN 24* 03/03/2014 1429   CREATININE 1.5 03/03/2014 1429   CALCIUM 8.9 03/03/2014 1429   PROT 6.3 07/28/2013 0746   ALBUMIN 3.3* 07/28/2013 0746   AST 33 07/28/2013 0746   ALT 44 07/28/2013 0746   ALKPHOS 134* 07/28/2013 0746   BILITOT 1.5* 07/28/2013 0746   GFRNONAA 58* 12/22/2010 0930   GFRAA  12/22/2010 0930    >60        The eGFR has been calculated using the MDRD equation. This calculation has not been validated in all clinical situations. eGFR's persistently <60  mL/min signify possible Chronic Kidney Disease.   Lab Results  Component Value Date   CHOL 162 07/28/2013   HDL 47.30 07/28/2013   LDLCALC 104* 07/28/2013   TRIG 54.0 07/28/2013   CHOLHDL 3 07/28/2013   No results found for: HGBA1C No results found for: VITAMINB12 Lab Results  Component Value Date   TSH 1.67 07/28/2013    12/22/10 hand xray - Osteoarthritis as described. No acute finding.     ASSESSMENT AND PLAN  70 y.o. year old male here with intermittent right hand and arm pain, numbness for past 2 months.  Ddx: carpal tunnel syndrome, cervical radiculopathy, musculoskeletal, arthritis, gout  PLAN: - conservative therapy with wrist splints at night x 4-6 week; if no improvement, then will check EMG/NCS  Return in about 3 months (around 03/23/2015).    Penni Bombard, MD 3/83/2919, 16:60 AM Certified in Neurology, Neurophysiology and Neuroimaging  [facility] [address] [place], [place] [address] [phone]

## 2014-12-22 NOTE — Patient Instructions (Signed)
Try wrist splints at bedtime.

## 2014-12-25 ENCOUNTER — Other Ambulatory Visit: Payer: Self-pay

## 2014-12-25 MED ORDER — CARVEDILOL 12.5 MG PO TABS: 12.5000 mg | ORAL_TABLET | Freq: Two times a day (BID) | ORAL | Status: DC

## 2014-12-25 MED ORDER — FUROSEMIDE 40 MG PO TABS: 40.0000 mg | ORAL_TABLET | Freq: Every day | ORAL | Status: DC

## 2015-01-09 ENCOUNTER — Encounter: Payer: Self-pay | Admitting: *Deleted

## 2015-02-12 ENCOUNTER — Encounter: Payer: Self-pay | Admitting: *Deleted

## 2015-02-14 ENCOUNTER — Encounter: Payer: Self-pay | Admitting: Internal Medicine

## 2015-02-14 ENCOUNTER — Inpatient Hospital Stay (HOSPITAL_COMMUNITY): Payer: BLUE CROSS/BLUE SHIELD

## 2015-02-14 ENCOUNTER — Encounter (HOSPITAL_COMMUNITY): Payer: Self-pay | Admitting: General Practice

## 2015-02-14 ENCOUNTER — Ambulatory Visit (INDEPENDENT_AMBULATORY_CARE_PROVIDER_SITE_OTHER): Payer: BLUE CROSS/BLUE SHIELD | Admitting: Internal Medicine

## 2015-02-14 ENCOUNTER — Inpatient Hospital Stay (HOSPITAL_COMMUNITY)
Admission: AD | Admit: 2015-02-14 | Discharge: 2015-02-16 | DRG: 261 | Disposition: A | Discharge status: Home / Self Care | Payer: BLUE CROSS/BLUE SHIELD | Source: Ambulatory Visit | Attending: Internal Medicine | Admitting: Internal Medicine

## 2015-02-14 VITALS — BP 134/76 | HR 48 | Ht 69.0 in | Wt 232.4 lb

## 2015-02-14 DIAGNOSIS — T82110A Breakdown (mechanical) of cardiac electrode, initial encounter: Secondary | ICD-10-CM

## 2015-02-14 DIAGNOSIS — Z87891 Personal history of nicotine dependence: Secondary | ICD-10-CM

## 2015-02-14 DIAGNOSIS — Y839 Surgical procedure, unspecified as the cause of abnormal reaction of the patient, or of later complication, without mention of misadventure at the time of the procedure: Secondary | ICD-10-CM | POA: Diagnosis present

## 2015-02-14 DIAGNOSIS — I429 Cardiomyopathy, unspecified: Secondary | ICD-10-CM | POA: Diagnosis present

## 2015-02-14 DIAGNOSIS — T82119A Breakdown (mechanical) of unspecified cardiac electronic device, initial encounter: Secondary | ICD-10-CM

## 2015-02-14 DIAGNOSIS — I5022 Chronic systolic (congestive) heart failure: Secondary | ICD-10-CM | POA: Diagnosis present

## 2015-02-14 DIAGNOSIS — Z959 Presence of cardiac and vascular implant and graft, unspecified: Secondary | ICD-10-CM

## 2015-02-14 DIAGNOSIS — I442 Atrioventricular block, complete: Secondary | ICD-10-CM | POA: Diagnosis present

## 2015-02-14 DIAGNOSIS — M109 Gout, unspecified: Secondary | ICD-10-CM | POA: Diagnosis present

## 2015-02-14 DIAGNOSIS — T82111D Breakdown (mechanical) of cardiac pulse generator (battery), subsequent encounter: Secondary | ICD-10-CM

## 2015-02-14 DIAGNOSIS — N189 Chronic kidney disease, unspecified: Secondary | ICD-10-CM | POA: Diagnosis present

## 2015-02-14 DIAGNOSIS — Z7951 Long term (current) use of inhaled steroids: Secondary | ICD-10-CM

## 2015-02-14 DIAGNOSIS — I129 Hypertensive chronic kidney disease with stage 1 through stage 4 chronic kidney disease, or unspecified chronic kidney disease: Secondary | ICD-10-CM | POA: Diagnosis present

## 2015-02-14 DIAGNOSIS — Z6834 Body mass index (BMI) 34.0-34.9, adult: Secondary | ICD-10-CM

## 2015-02-14 DIAGNOSIS — Z7982 Long term (current) use of aspirin: Secondary | ICD-10-CM

## 2015-02-14 DIAGNOSIS — N183 Chronic kidney disease, stage 3 (moderate): Secondary | ICD-10-CM | POA: Diagnosis present

## 2015-02-14 DIAGNOSIS — Z79899 Other long term (current) drug therapy: Secondary | ICD-10-CM | POA: Diagnosis not present

## 2015-02-14 DIAGNOSIS — T82111A Breakdown (mechanical) of cardiac pulse generator (battery), initial encounter: Secondary | ICD-10-CM | POA: Diagnosis present

## 2015-02-14 DIAGNOSIS — T82190A Other mechanical complication of cardiac electrode, initial encounter: Secondary | ICD-10-CM

## 2015-02-14 DIAGNOSIS — I509 Heart failure, unspecified: Secondary | ICD-10-CM | POA: Diagnosis not present

## 2015-02-14 DIAGNOSIS — I428 Other cardiomyopathies: Secondary | ICD-10-CM | POA: Diagnosis not present

## 2015-02-14 HISTORY — DX: Presence of cardiac pacemaker: Z95.0

## 2015-02-14 HISTORY — DX: Gastro-esophageal reflux disease without esophagitis: K21.9

## 2015-02-14 LAB — CBC WITH DIFFERENTIAL/PLATELET
BASOS ABS: 0 10*3/uL (ref 0.0–0.1)
BASOS PCT: 1 % (ref 0–1)
Eosinophils Absolute: 0.4 10*3/uL (ref 0.0–0.7)
Eosinophils Relative: 7 % — ABNORMAL HIGH (ref 0–5)
HCT: 43.5 % (ref 39.0–52.0)
Hemoglobin: 14.3 g/dL (ref 13.0–17.0)
LYMPHS ABS: 1.9 10*3/uL (ref 0.7–4.0)
Lymphocytes Relative: 33 % (ref 12–46)
MCH: 29.7 pg (ref 26.0–34.0)
MCHC: 32.9 g/dL (ref 30.0–36.0)
MCV: 90.4 fL (ref 78.0–100.0)
MONO ABS: 0.4 10*3/uL (ref 0.1–1.0)
Monocytes Relative: 7 % (ref 3–12)
NEUTROS PCT: 52 % (ref 43–77)
Neutro Abs: 2.9 10*3/uL (ref 1.7–7.7)
PLATELETS: 150 10*3/uL (ref 150–400)
RBC: 4.81 MIL/uL (ref 4.22–5.81)
RDW: 14.9 % (ref 11.5–15.5)
WBC: 5.5 10*3/uL (ref 4.0–10.5)

## 2015-02-14 LAB — BASIC METABOLIC PANEL
Anion gap: 9 (ref 5–15)
BUN: 33 mg/dL — ABNORMAL HIGH (ref 6–20)
CHLORIDE: 105 mmol/L (ref 101–111)
CO2: 23 mmol/L (ref 22–32)
Calcium: 8.9 mg/dL (ref 8.9–10.3)
Creatinine, Ser: 1.53 mg/dL — ABNORMAL HIGH (ref 0.61–1.24)
GFR, EST AFRICAN AMERICAN: 51 mL/min — AB (ref >60)
GFR, EST NON AFRICAN AMERICAN: 44 mL/min — AB (ref >60)
Glucose, Bld: 106 mg/dL — ABNORMAL HIGH (ref 65–99)
Potassium: 4.1 mmol/L (ref 3.5–5.1)
SODIUM: 137 mmol/L (ref 135–145)

## 2015-02-14 LAB — CUP PACEART INCLINIC DEVICE CHECK
Battery Impedance: 375 Ohm
Battery Remaining Longevity: 109 mo
Battery Voltage: 2.78 V
Lead Channel Impedance Value: 54130 Ohm
Lead Channel Pacing Threshold Pulse Width: 0.4 ms
Lead Channel Sensing Intrinsic Amplitude: 11.2 mV
MDC IDC MSMT LEADCHNL RV IMPEDANCE VALUE: 1891 Ohm
MDC IDC MSMT LEADCHNL RV PACING THRESHOLD AMPLITUDE: 1.75 V
MDC IDC SESS DTM: 20160608150154
MDC IDC SET LEADCHNL RV PACING AMPLITUDE: 5 V
MDC IDC SET LEADCHNL RV PACING PULSEWIDTH: 1 ms
MDC IDC SET LEADCHNL RV SENSING SENSITIVITY: 11.2 mV
MDC IDC STAT BRADY RV PERCENT PACED: 95 %

## 2015-02-14 LAB — PROTIME-INR
INR: 1.18 (ref 0.00–1.49)
Prothrombin Time: 15.2 seconds (ref 11.6–15.2)

## 2015-02-14 MED ORDER — AMLODIPINE BESYLATE 5 MG PO TABS
5.0000 mg | ORAL_TABLET | Freq: Every day | ORAL | Status: DC
Start: 2015-02-15 — End: 2015-02-16
  Administered 2015-02-16: 5 mg via ORAL
  Filled 2015-02-14: qty 1

## 2015-02-14 MED ORDER — SODIUM CHLORIDE 0.9 % IV SOLN: 250.0000 mL | INTRAVENOUS | Status: DC | PRN

## 2015-02-14 MED ORDER — SODIUM CHLORIDE 0.9 % IJ SOLN: 3.0000 mL | INTRAMUSCULAR | Status: DC | PRN

## 2015-02-14 MED ORDER — SODIUM CHLORIDE 0.9 % IV SOLN: INTRAVENOUS | Status: DC

## 2015-02-14 MED ORDER — VANCOMYCIN HCL 10 G IV SOLR
1500.0000 mg | INTRAVENOUS | Status: DC
Start: 2015-02-15 — End: 2015-02-15
  Filled 2015-02-14 (×2): qty 1500

## 2015-02-14 MED ORDER — SODIUM CHLORIDE 0.9 % IJ SOLN: 3.0000 mL | Freq: Two times a day (BID) | INTRAMUSCULAR | Status: DC

## 2015-02-14 MED ORDER — ACETAMINOPHEN 325 MG PO TABS: 650.0000 mg | ORAL_TABLET | ORAL | Status: DC | PRN

## 2015-02-14 MED ORDER — SODIUM CHLORIDE 0.45 % IV SOLN
INTRAVENOUS | Status: DC
  Administered 2015-02-15: 06:00:00 via INTRAVENOUS

## 2015-02-14 MED ORDER — GENTAMICIN SULFATE 40 MG/ML IJ SOLN
80.0000 mg | INTRAMUSCULAR | Status: DC
  Filled 2015-02-14: qty 2

## 2015-02-14 MED ORDER — ONDANSETRON HCL 4 MG/2ML IJ SOLN: 4.0000 mg | Freq: Four times a day (QID) | INTRAMUSCULAR | Status: DC | PRN

## 2015-02-14 MED ORDER — NITROGLYCERIN 0.4 MG SL SUBL: 0.4000 mg | SUBLINGUAL_TABLET | SUBLINGUAL | Status: DC | PRN

## 2015-02-14 MED ORDER — SODIUM CHLORIDE 0.45 % IV SOLN: INTRAVENOUS | Status: DC

## 2015-02-14 NOTE — Progress Notes (Signed)
Found pt disconnecting telemetry leads, states he is getting up to use restroom, explained to pt need to be on bed rest, pt refused to stay in bed, stated he understands his situation is serious but he will get up when he wants, telemetry changed to portable box, pacer pads placed per MD request, pt verbalizes understanding, will continue to monitor closely.

## 2015-02-14 NOTE — Progress Notes (Signed)
Pt admitted from the office with fractured atrial and ventricular leads Frequent inhibition of ventricular pacing on telemetry Will program device VOO for tonight  Plan lead revisions tomorrow Echo has been called about need for early echo  Gypsy Balsam, NP 02/14/2015 4:54 PM  Hillis Range MD, [facility] 02/15/2015 4:44 PM

## 2015-02-14 NOTE — Patient Instructions (Signed)
Medication Instructions:  Your physician recommends that you continue on your current medications as directed. Please refer to the Current Medication list given to you today.   Labwork: Will obtain at the hospital  Testing/Procedures:  Admit today and obtain labs at hospital  Follow-Up: Your physician recommends that you schedule a follow-up appointment in: 7-10 days from 03/17/15 in device clinic for wound check   Any Other Special Instructions Will Be Listed Below (If Applicable).

## 2015-02-14 NOTE — Progress Notes (Signed)
Electrophysiology Office Note   Date:  02/14/2015   ID:  Linkin, Gerges [birth year], MRN [id number]  PCP:  [name], MD  Cardiologist:  Dr [place] Primary Electrophysiologist: Hillis Range, MD    Chief Complaint  Patient presents with  . Complete heart block     History of Present Illness: [name] is a 69 y.o. male who presents today for electrophysiology evaluation.   The patient presents today for EP evaluation.  He remains very active. He has been noncomplaint with remote pacemaker follow-up.  He reports recent symptoms of presyncope.  He has not had full syncope.  He does not recall any recent trauma to his chest but has fractured both pacemaker leads.  He has h/o CHF for which he has declined ICD in the past.  Today, he denies symptoms of palpitations, chest pain, shortness of breath, orthopnea, PND, lower extremity edema, claudication,  syncope, bleeding, or neurologic sequela. The patient is tolerating medications without difficulties and is otherwise without complaint today.    Past Medical History  Diagnosis Date  . Complete heart block     s/p PPM by Dr Reyes Ivan  . Chronic renal insufficiency     WITH BASELINE CREATININE OF 1.6  . Hypertension   . Coronary disease     NONOBSTRUCTIVE  . Hyperkalemia   . Chronic systolic CHF (congestive heart failure)   . Nonischemic cardiomyopathy   . Gout    Past Surgical History  Procedure Laterality Date  . Pacemaker insertion  05/20/2009    MDT by Dr Reyes Ivan for complete heart block  . Left and right heart catheterization with coronary angiogram N/A 08/16/2013    Procedure: LEFT AND RIGHT HEART CATHETERIZATION WITH CORONARY ANGIOGRAM;  Surgeon: [name] Swaziland, MD;  Location: [facility] CATH LAB;  Service: Cardiovascular;  Laterality: N/A;     Current Outpatient Prescriptions  Medication Sig Dispense Refill  . amLODipine (NORVASC) 5 MG tablet Take 5 mg by mouth daily.    . Ascorbic Acid (VITAMIN C) 1000 MG tablet  Take 2,000 mg by mouth every Monday, Wednesday, and Friday.    Marland Kitchen aspirin 81 MG tablet Take 81 mg by mouth daily.      . carvedilol (COREG) 12.5 MG tablet Take 1 tablet (12.5 mg total) by mouth 2 (two) times daily. 180 tablet 3  . Cholecalciferol (VITAMIN D-3) 1000 UNITS CAPS Take 1,000 Units by mouth daily.    . Colchicine 0.6 MG CAPS Take 1 tablet by mouth as needed.     . Fluticasone-Salmeterol (ADVAIR) 250-50 MCG/DOSE AEPB Inhale 1 puff into the lungs as needed.    . furosemide (LASIX) 40 MG tablet Take 1 tablet (40 mg total) by mouth daily. 30 tablet 6  . GARLIC PO Take 1 tablet by mouth daily.     . Glucosamine HCl (GLUCOSAMINE PO) Take 1 tablet by mouth daily.      No current facility-administered medications for this visit.    Allergies:   Avalide; Penicillins; and Tinactin   Social History:  The patient  reports that he quit smoking about 24 years ago. He does not have any smokeless tobacco history on file. He reports that he drinks alcohol. He reports that he does not use illicit drugs.   Family History:  The patient's family history includes Diabetes in his father.    ROS:  Please see the history of present illness.   All other systems are reviewed and negative.    PHYSICAL EXAM: VS:  BP 134/76 mmHg  Pulse 48  Ht  (1.753 m)  Wt 105.416 kg (232 lb 6.4 oz)  BMI 34.30 kg/m2  SpO2 97% , BMI Body mass index is 34.3 kg/(m^2). GEN: Well nourished, well developed, in no acute distress HEENT: normal Neck: no JVD, carotid bruits, or masses Cardiac: RRR; no murmurs, rubs, or gallops,no edema  Respiratory:  clear to auscultation bilaterally, normal work of breathing GI: soft, nontender, nondistended, + BS MS: no deformity or atrophy Skin: warm and dry, device pocket is well healed Neuro:  Strength and sensation are intact Psych: euthymic mood, full affect  Device interrogation is reviewed today in detail.  See PaceArt for details.   Recent Labs: 03/03/2014: BUN 24*;  Creatinine, Ser 1.5; Potassium 3.9; Pro B Natriuretic peptide (BNP) 309.0*; Sodium 138    Lipid Panel     Component Value Date/Time   CHOL 162 07/28/2013 0746   TRIG 54.0 07/28/2013 0746   HDL 47.30 07/28/2013 0746   CHOLHDL 3 07/28/2013 0746   VLDL 10.8 07/28/2013 0746   LDLCALC 104* 07/28/2013 0746     Wt Readings from Last 3 Encounters:  02/14/15 105.416 kg (232 lb 6.4 oz)  12/22/14 102.513 kg (226 lb)  10/16/14 107.049 kg (236 lb)      Other studies Reviewed: Additional studies/ records that were reviewed today include: Dr Illa Level notes, Echo from 2015  Review of the above records today demonstrates: EF 25%   ASSESSMENT AND PLAN:  1.  Complete heart block Pacemaker leads have both broken.  He has inhibition due to noise with pocket manipulation today.  His A lead is no longer functioning.  Fortunately, his RV lead is working though only unipolar.  He requires urgent lead revision.  I have therefore recommended hospitalization today for lead revision.  I have advised EMS transport to the hospital and have informed him that he cannot drive.  He is going to drive himself to the hospital against my advise and understands risks of death. Risks, benefits, alternatives to pacemaker lead revision with new RA, RV, and LV lead placement were discussed in detail with the patient today. He continues to decline ICD but is willing to consider LV lead placement.  The patient understands that the risks include but are not limited to bleeding, infection, pneumothorax, perforation, tamponade, vascular damage, renal failure, MI, stroke, death,  and lead dislodgement and wishes to proceed. We will therefore admit further planned urgent procedure tomorrow. I have reprogrammed V lead sensitivity to 11mV today and have increased RV pacing output to 5V  msec. Will admit to telemetry bed CXR Echo  2. Chronic systolic dysfunction Repeat echo Plan for LV lead   Current medicines are reviewed at  length with the patient today.   The patient does not have concerns regarding his medicines.  The following changes were made today:  none  Labs/ tests ordered today include:  Orders Placed This Encounter  Procedures  . Basic metabolic panel  . CBC with Differential/Platelet    Signed, Hillis Range, MD  02/14/2015 12:36 PM     [facility] [address] [place] [address] [phone] (office) [phone] (fax)

## 2015-02-14 NOTE — H&P (Signed)
Expand All Collapse All      Electrophysiology Office Note   Date: 02/14/2015   ID: Johncarlo, Laxson Feb 20, 1946, MRN [id number]  PCP: [name], MD Cardiologist: Dr [place] Primary Electrophysiologist: Hillis Range, MD   Chief Complaint  Patient presents with  . Complete heart block    History of Present Illness: YEHUDA OKUBO is a 69 y.o. male who presents today for electrophysiology evaluation. The patient presents today for EP evaluation. He remains very active. He has been noncomplaint with remote pacemaker follow-up. He reports recent symptoms of presyncope. He has not had full syncope. He does not recall any recent trauma to his chest but has fractured both pacemaker leads. He has h/o CHF for which he has declined ICD in the past.  Today, he denies symptoms of palpitations, chest pain, shortness of breath, orthopnea, PND, lower extremity edema, claudication, syncope, bleeding, or neurologic sequela. The patient is tolerating medications without difficulties and is otherwise without complaint today.    Past Medical History  Diagnosis Date  . Complete heart block     s/p PPM by Dr Reyes Ivan  . Chronic renal insufficiency     WITH BASELINE CREATININE OF 1.6  . Hypertension   . Coronary disease     NONOBSTRUCTIVE  . Hyperkalemia   . Chronic systolic CHF (congestive heart failure)   . Nonischemic cardiomyopathy   . Gout    Past Surgical History  Procedure Laterality Date  . Pacemaker insertion  05/20/2009    MDT by Dr Reyes Ivan for complete heart block  . Left and right heart catheterization with coronary angiogram N/A 08/16/2013    Procedure: LEFT AND RIGHT HEART CATHETERIZATION WITH CORONARY ANGIOGRAM; Surgeon: [name] Swaziland, MD; Location: [facility] CATH LAB; Service: Cardiovascular; Laterality: N/A;     Current Outpatient Prescriptions  Medication Sig Dispense Refill  .  amLODipine (NORVASC) 5 MG tablet Take 5 mg by mouth daily.    . Ascorbic Acid (VITAMIN C) 1000 MG tablet Take 2,000 mg by mouth every Monday, Wednesday, and Friday.    Marland Kitchen aspirin 81 MG tablet Take 81 mg by mouth daily.     . carvedilol (COREG) 12.5 MG tablet Take 1 tablet (12.5 mg total) by mouth 2 (two) times daily. 180 tablet 3  . Cholecalciferol (VITAMIN D-3) 1000 UNITS CAPS Take 1,000 Units by mouth daily.    . Colchicine 0.6 MG CAPS Take 1 tablet by mouth as needed.     . Fluticasone-Salmeterol (ADVAIR) 250-50 MCG/DOSE AEPB Inhale 1 puff into the lungs as needed.    . furosemide (LASIX) 40 MG tablet Take 1 tablet (40 mg total) by mouth daily. 30 tablet 6  . GARLIC PO Take 1 tablet by mouth daily.     . Glucosamine HCl (GLUCOSAMINE PO) Take 1 tablet by mouth daily.      No current facility-administered medications for this visit.    Allergies: Avalide; Penicillins; and Tinactin   Social History: The patient  reports that he quit smoking about 24 years ago. He does not have any smokeless tobacco history on file. He reports that he drinks alcohol. He reports that he does not use illicit drugs.   Family History: The patient's family history includes Diabetes in his father.    ROS: Please see the history of present illness. All other systems are reviewed and negative.    PHYSICAL EXAM: VS: BP 134/76 mmHg  Pulse 48  Ht 5\' 9"  (1.753 m)  Wt 105.416 kg (232 lb 6.4  oz)  BMI 34.30 kg/m2  SpO2 97% , BMI Body mass index is 34.3 kg/(m^2). GEN: Well nourished, well developed, in no acute distress  HEENT: normal  Neck: no JVD, carotid bruits, or masses Cardiac: RRR; no murmurs, rubs, or gallops,no edema  Respiratory: clear to auscultation bilaterally, normal work of breathing GI: soft, nontender, nondistended, + BS MS: no deformity or atrophy  Skin: warm and dry, device pocket is well healed Neuro: Strength and sensation are  intact Psych: euthymic mood, full affect  Device interrogation is reviewed today in detail. See PaceArt for details.   Recent Labs: 03/03/2014: BUN 24*; Creatinine, Ser 1.5; Potassium 3.9; Pro B Natriuretic peptide (BNP) 309.0*; Sodium 138    Lipid Panel   Labs (Brief)       Component Value Date/Time   CHOL 162 07/28/2013 0746   TRIG 54.0 07/28/2013 0746   HDL 47.30 07/28/2013 0746   CHOLHDL 3 07/28/2013 0746   VLDL 10.8 07/28/2013 0746   LDLCALC 104* 07/28/2013 0746       Wt Readings from Last 3 Encounters:  02/14/15 105.416 kg (232 lb 6.4 oz)  12/22/14 102.513 kg (226 lb)  10/16/14 107.049 kg (236 lb)      Other studies Reviewed: Additional studies/ records that were reviewed today include: Dr Illa Level notes, Echo from 2015  Review of the above records today demonstrates: EF 25%   ASSESSMENT AND PLAN:  1. Complete heart block Pacemaker leads have both broken. He has inhibition due to noise with pocket manipulation today. His A lead is no longer functioning. Fortunately, his RV lead is working though only unipolar. He requires urgent lead revision. I have therefore recommended hospitalization today for lead revision. I have advised EMS transport to the hospital and have informed him that he cannot drive. He is going to drive himself to the hospital against my advise and understands risks of death. Risks, benefits, alternatives to pacemaker lead revision with new RA, RV, and LV lead placement were discussed in detail with the patient today. He continues to decline ICD but is willing to consider LV lead placement. The patient understands that the risks include but are not limited to bleeding, infection, pneumothorax, perforation, tamponade, vascular damage, renal failure, MI, stroke, death, and lead dislodgement and wishes to proceed. We will therefore admit further planned urgent procedure tomorrow. I have reprogrammed V lead  sensitivity to 11mV today and have increased RV pacing output to 5V  msec. Will admit to telemetry bed CXR Echo  2. Chronic systolic dysfunction Repeat echo Plan for LV lead   Current medicines are reviewed at length with the patient today.  The patient does not have concerns regarding his medicines. The following changes were made today: none  Labs/ tests ordered today include:  Orders Placed This Encounter  Procedures  . Basic metabolic panel  . CBC with Differential/Platelet    Signed, Hillis Range, MD  02/14/2015 12:36 PM   [facility] [address] [place] [address] [phone] (office) [phone] (fax)

## 2015-02-15 ENCOUNTER — Encounter (HOSPITAL_COMMUNITY)
Admission: AD | Disposition: A | Discharge status: Home / Self Care | Payer: Self-pay | Source: Ambulatory Visit | Attending: Internal Medicine

## 2015-02-15 ENCOUNTER — Inpatient Hospital Stay (HOSPITAL_COMMUNITY): Payer: BLUE CROSS/BLUE SHIELD

## 2015-02-15 DIAGNOSIS — I509 Heart failure, unspecified: Secondary | ICD-10-CM

## 2015-02-15 DIAGNOSIS — T82110A Breakdown (mechanical) of cardiac electrode, initial encounter: Secondary | ICD-10-CM

## 2015-02-15 DIAGNOSIS — I442 Atrioventricular block, complete: Secondary | ICD-10-CM

## 2015-02-15 DIAGNOSIS — I5022 Chronic systolic (congestive) heart failure: Secondary | ICD-10-CM

## 2015-02-15 HISTORY — PX: EP IMPLANTABLE DEVICE: SHX172B

## 2015-02-15 SURGERY — LEAD REVISION/REPAIR
Anesthesia: LOCAL

## 2015-02-15 MED ORDER — ACETAMINOPHEN 325 MG PO TABS
325.0000 mg | ORAL_TABLET | ORAL | Status: DC | PRN
Start: 2015-02-15 — End: 2015-02-16

## 2015-02-15 MED ORDER — MIDAZOLAM HCL 5 MG/5ML IJ SOLN
INTRAMUSCULAR | Status: AC
  Filled 2015-02-15: qty 5

## 2015-02-15 MED ORDER — MIDAZOLAM HCL 5 MG/5ML IJ SOLN
INTRAMUSCULAR | Status: DC | PRN
  Administered 2015-02-15: 2 mg via INTRAVENOUS
  Administered 2015-02-15: 1 mg via INTRAVENOUS

## 2015-02-15 MED ORDER — IBUPROFEN 200 MG PO TABS
400.0000 mg | ORAL_TABLET | Freq: Four times a day (QID) | ORAL | Status: DC | PRN
  Administered 2015-02-15: 400 mg via ORAL
  Filled 2015-02-15 (×2): qty 2

## 2015-02-15 MED ORDER — IOHEXOL 350 MG/ML SOLN
INTRAVENOUS | Status: DC | PRN
  Administered 2015-02-15: 26 mL via INTRAVENOUS
  Administered 2015-02-15: 15 mg via INTRAVENOUS

## 2015-02-15 MED ORDER — HEPARIN (PORCINE) IN NACL 2-0.9 UNIT/ML-% IJ SOLN
INTRAMUSCULAR | Status: AC
  Filled 2015-02-15: qty 500

## 2015-02-15 MED ORDER — LIDOCAINE HCL (PF) 1 % IJ SOLN
INTRAMUSCULAR | Status: AC
  Filled 2015-02-15: qty 60

## 2015-02-15 MED ORDER — FENTANYL CITRATE (PF) 100 MCG/2ML IJ SOLN
INTRAMUSCULAR | Status: DC | PRN
  Administered 2015-02-15: 50 ug via INTRAVENOUS

## 2015-02-15 MED ORDER — HYDROCODONE-ACETAMINOPHEN 5-325 MG PO TABS: 1.0000 | ORAL_TABLET | ORAL | Status: DC | PRN

## 2015-02-15 MED ORDER — ONDANSETRON HCL 4 MG/2ML IJ SOLN: 4.0000 mg | Freq: Four times a day (QID) | INTRAMUSCULAR | Status: DC | PRN

## 2015-02-15 MED ORDER — SODIUM CHLORIDE 0.9 % IJ SOLN: 3.0000 mL | INTRAMUSCULAR | Status: DC | PRN

## 2015-02-15 MED ORDER — SODIUM CHLORIDE 0.9 % IJ SOLN
3.0000 mL | Freq: Two times a day (BID) | INTRAMUSCULAR | Status: DC
  Administered 2015-02-15 – 2015-02-16 (×2): 3 mL via INTRAVENOUS

## 2015-02-15 MED ORDER — PERFLUTREN LIPID MICROSPHERE
1.0000 mL | INTRAVENOUS | Status: DC | PRN
  Administered 2015-02-15: 2 mL via INTRAVENOUS
  Filled 2015-02-15: qty 10

## 2015-02-15 MED ORDER — FENTANYL CITRATE (PF) 100 MCG/2ML IJ SOLN
INTRAMUSCULAR | Status: AC
  Filled 2015-02-15: qty 2

## 2015-02-15 MED ORDER — VANCOMYCIN HCL 10 G IV SOLR
1500.0000 mg | INTRAVENOUS | Status: DC | PRN
  Administered 2015-02-15: 1500 mg via INTRAVENOUS

## 2015-02-15 MED ORDER — LIDOCAINE HCL (PF) 1 % IJ SOLN
INTRAMUSCULAR | Status: DC | PRN
  Administered 2015-02-15: 50 mg

## 2015-02-15 MED ORDER — VANCOMYCIN HCL IN DEXTROSE 1-5 GM/200ML-% IV SOLN
1000.0000 mg | Freq: Two times a day (BID) | INTRAVENOUS | Status: AC
  Administered 2015-02-15: 1000 mg via INTRAVENOUS
  Filled 2015-02-15: qty 200

## 2015-02-15 MED ORDER — SODIUM CHLORIDE 0.9 % IR SOLN
Status: AC
  Filled 2015-02-15: qty 2

## 2015-02-15 MED ORDER — SODIUM CHLORIDE 0.9 % IV SOLN: 250.0000 mL | INTRAVENOUS | Status: DC | PRN

## 2015-02-15 SURGICAL SUPPLY — 13 items
CABLE SURGICAL S-101-97-12 (CABLE) ×4 IMPLANT
DEVICE TORQUE .025-.038 (MISCELLANEOUS) ×2 IMPLANT
GUIDEWIRE ANGLED .035X150CM (WIRE) ×2 IMPLANT
KIT ESSENTIALS PG (KITS) ×2 IMPLANT
LEAD CAPSURE NOVUS 5076-52CM (Lead) ×1 IMPLANT
LEAD CAPSURE NOVUS 5076-58CM (Lead) ×1 IMPLANT
LEAD QUICKFLEX 1258T-86 (Lead) ×1 IMPLANT
PAD DEFIB LIFELINK (PAD) ×2 IMPLANT
PPM CONSULTA CRT-P C4TR01 (Pacemaker) ×1 IMPLANT
SHEATH CLASSIC 7F (SHEATH) ×4 IMPLANT
SHEATH CLASSIC 9.5F (SHEATH) ×2 IMPLANT
TRAY PACEMAKER INSERTION (CUSTOM PROCEDURE TRAY) ×2 IMPLANT
WIRE AMPLATZ SS-J .035X260CM (WIRE) ×1 IMPLANT

## 2015-02-15 NOTE — Progress Notes (Signed)
  Echocardiogram 2D Echocardiogram with Definity has been performed.  Darrel Baroni 02/15/2015, 10:07 AM

## 2015-02-15 NOTE — Progress Notes (Signed)
UR Completed Mylissa Lambe Graves-Bigelow, RN,BSN [phone]  

## 2015-02-15 NOTE — Progress Notes (Signed)
    SUBJECTIVE: The patient is doing well today.  At this time, he denies chest pain, shortness of breath, or any new concerns.  CURRENT MEDICATIONS: . amLODipine  5 mg Oral Daily  . gentamicin irrigation  80 mg Irrigation To Cath  . vancomycin  1,500 mg Intravenous To Cath   . sodium chloride 10 mL/hr at 02/15/15 0549  . sodium chloride      OBJECTIVE: Physical Exam: Filed Vitals:   02/14/15 1435 02/14/15 2000 02/15/15 0400 02/15/15 0751  BP: 167/87 157/92 148/86 150/83  Pulse: 58 59 59 58  Temp: 97.7 F (36.5 C) 97.9 F (36.6 C) 97.7 F (36.5 C) 97.7 F (36.5 C)  TempSrc: Oral Oral Oral Oral  Resp: 18 18 18 19   Weight:   231 lb 8 oz (105.008 kg)   SpO2: 97% 99% 100% 100%    Intake/Output Summary (Last 24 hours) at 02/15/15 2919 Last data filed at 02/14/15 1813  Gross per 24 hour  Intake    236 ml  Output      0 ml  Net    236 ml    Telemetry reveals sinus rhythm with ventricular pacing and AV dissociation; intermittent ventricular noncapture  GEN- The patient is obese appearing, alert and oriented x 3 today.   Head- normocephalic, atraumatic Eyes-  Sclera clear, conjunctiva pink Ears- hearing intact Oropharynx- clear Neck- supple, no JVP Lymph- no cervical lymphadenopathy Lungs- Clear to ausculation bilaterally, normal work of breathing Heart- Regular rate and rhythm (paced) GI- soft, NT, ND, + BS Extremities- no clubbing, cyanosis, or edema Skin- no rash or lesion Psych- euthymic mood, full affect Neuro- strength and sensation are intact  LABS: Basic Metabolic Panel:  Recent Labs  16/60/60 1650  NA 137  K 4.1  CL 105  CO2 23  GLUCOSE 106*  BUN 33*  CREATININE 1.53*  CALCIUM 8.9   CBC:  Recent Labs  02/14/15 1650  WBC 5.5  NEUTROABS 2.9  HGB 14.3  HCT 43.5  MCV 90.4  PLT 150    RADIOLOGY: Dg Chest 2 View 02/14/2015   CLINICAL DATA:  Pacemaker malfunction.  EXAM: CHEST  2 VIEW  COMPARISON:  07/28/2013  FINDINGS: Left-sided dual lead  pacemaker remains in place. Leads are unchanged in appearance and appear intact, terminating over the right atrium and right ventricle. Cardiac silhouette remains enlarged, unchanged. Slight prominence of the hila is unchanged. There is no evidence of airspace consolidation, edema, pleural effusion, or pneumothorax. Thoracic spondylosis is noted.  IMPRESSION: Unchanged appearance of pacemaker. No evidence of active cardiopulmonary disease.   Electronically Signed   By: Sebastian Ache   On: 02/14/2015 15:46    ASSESSMENT AND PLAN:  Active Problems:   Pacemaker malfunction  1.  Complete heart block The patient has complete heart block and has been found to have fracture of both atrial and ventricular leads.  His device has been programmed VOO at max output with intermittent non capture. Plan RA, RV lead revision today with upgrade to CRTP.    2.  Chronic systolic dysfunction Echo pending this morning Plan CRTP upgrade today  3.  HTN BP elevated, will adjust medications after PPM upgrade  Gypsy Balsam, NP 02/15/2015 8:11 AM  I have seen, examined the patient, and reviewed the above assessment and plan.  Changes to above are made where necessary.    Co Sign: Hillis Range, MD 02/15/2015 4:44 PM

## 2015-02-15 NOTE — Interval H&P Note (Signed)
History and Physical Interval Note:  02/15/2015 9:21 AM  [name]  has presented today for surgery, with the diagnosis of pacer lead revision  The various methods of treatment have been discussed with the patient and family. After consideration of risks, benefits and other options for treatment, the patient has consented to  Procedure(s): Lead Revision/Repair (N/A) with upgrade to CRT-P device as a surgical intervention .  The patient's history has been reviewed, patient examined, no change in status, stable for surgery.  I have reviewed the patient's chart and labs.  Questions were answered to the patient's satisfaction.     Hillis Range

## 2015-02-15 NOTE — Discharge Summary (Signed)
ELECTROPHYSIOLOGY PROCEDURE DISCHARGE SUMMARY    Patient ID: ERHARDT MANCIL,  MRN: [id number], DOB/AGE: July 26, 1946 69 y.o.  Admit date: 02/14/2015 Discharge date: 02/16/2015  Primary Care Physician: [name], MD Primary Cardiologist: [place] Electrophysiologist: Yash Cacciola  Primary Discharge Diagnosis:  Fractured atrial and ventricular leads with upgrade to cardiac resynchronization therapy pacemaker this admission  Secondary Discharge Diagnosis:  1.  Non ischemic cardiomyopathy presumed 2/2 chronic V pacing 2.  Complete heart block status post pacemaker implantation 2010 3.  Hypertension 4.  Chronic renal insufficiency  Allergies  Allergen Reactions  . Avalide [Irbesartan-Hydrochlorothiazide]   . Penicillins   . Tinactin [Tolnaftate]      Procedures This Admission:  1.  Pacemaker system revision with insertion of new RA and RV leads and addition of LV lead with implantation of a MDT CRTP on 02/15/15 by Dr Johney Frame.  The patient received a MDT model number C4TR01 PPM with model number 5076 right atrial lead and 5076 right ventricular lead and 1258 LV lead. There were no immediate post procedure complications. 2.  CXR on 02/16/15 demonstrated no pneumothorax status post device implantation.   Brief HPI: [name] is a 69 y.o. male with a past medical history as outlined above.  He underwent pacemaker implantation in 2010 and has done well.  He presented to the office for routine device follow up and was found to have fracture of both his atrial and ventricular leads with non-capture.  He was referred to [facility] for planned pacemaker system revision.  He has a non-ischemic cardiomyopathy and declined ICD implantation but did agree to [facility].   Risks, benefits, and alternatives to PPM system revision were reviewed with the patient who wished to proceed.   Hospital Course:  The patient was admitted and underwent insertion of new RA, RV, and LV leads with upgrade to CRTP with  details as outlined above.  He  was monitored on telemetry overnight which demonstrated sinus rhythm with ventricular pacing.  Left chest was without hematoma or ecchymosis.  The device was interrogated and found to be functioning normally.  CXR was obtained and demonstrated no pneumothorax status post device implantation.  Wound care, arm mobility, and restrictions were reviewed with the patient.  The patient was examined and considered stable for discharge to home.    Physical Exam: Filed Vitals:   02/15/15 2019 02/15/15 2040 02/16/15 0002 02/16/15 0611  BP:  171/88 152/81 165/78  Pulse:  62 67 67  Temp: 98.2 F (36.8 C)  97.8 F (36.6 C) 97.8 F (36.6 C)  TempSrc: Oral  Oral Oral  Resp:  11  16  Weight:    104 lb 9.6 oz (47.446 kg)  SpO2:  98% 98% 98%    GEN- The patient is well appearing, alert and oriented x 3 today.   HEENT: normocephalic, atraumatic; sclera clear, conjunctiva pink; hearing intact; oropharynx clear; neck supple, no JVP Lymph- no cervical lymphadenopathy Lungs- Clear to ausculation bilaterally, normal work of breathing.  No wheezes, rales, rhonchi Heart- Regular rate and rhythm (paced) GI- soft, non-tender, non-distended, bowel sounds present Extremities- no clubbing, cyanosis, or edema; DP/PT/radial pulses 2+ bilaterally MS- no significant deformity or atrophy Skin- warm and dry, no rash or lesion, left chest without hematoma/ecchymosis Psych- euthymic mood, full affect Neuro- strength and sensation are intact   Labs:   Lab Results  Component Value Date   WBC 5.5 02/14/2015   HGB 14.3 02/14/2015   HCT 43.5 02/14/2015   MCV 90.4  02/14/2015   PLT 150 02/14/2015     Recent Labs Lab 02/16/15 0309  NA 137  K 4.1  CL 104  CO2 24  BUN 23*  CREATININE 1.44*  CALCIUM 8.9  GLUCOSE 105*    Discharge Medications:    Medication List    TAKE these medications        amLODipine 5 MG tablet  Commonly known as:  NORVASC  Take 5 mg by mouth daily.       aspirin 81 MG tablet  Take 81 mg by mouth daily.     carvedilol 12.5 MG tablet  Commonly known as:  COREG  Take 1 tablet (12.5 mg total) by mouth 2 (two) times daily.     Colchicine 0.6 MG Caps  Take 1 tablet by mouth as needed (gout).     Fluticasone-Salmeterol 250-50 MCG/DOSE Aepb  Commonly known as:  ADVAIR  Inhale 1 puff into the lungs as needed (shortness of breath).     furosemide 40 MG tablet  Commonly known as:  LASIX  Take 1 tablet (40 mg total) by mouth daily.     GARLIC PO  Take 1 tablet by mouth daily.     GLUCOSAMINE PO  Take 1 tablet by mouth daily.     ibuprofen 200 MG tablet  Commonly known as:  ADVIL,MOTRIN  Take 200 mg by mouth every 6 (six) hours as needed for mild pain or moderate pain.     vitamin C 1000 MG tablet  Take 2,000 mg by mouth daily.     Vitamin D-3 1000 UNITS Caps  Take 1,000 Units by mouth daily.        Disposition:  Discharge Instructions    Diet - low sodium heart healthy    Complete by:  As directed      Increase activity slowly    Complete by:  As directed           Follow-up Information    Follow up with [facility] On 02/26/2015.   Why:  at 4:30PM for wound check   Contact information:   [address] [place] [address]       Duration of Discharge Encounter: Greater than 30 minutes including physician time.  Signed, Gypsy Balsam, NP 02/16/2015 8:35 AM   I have seen, examined the patient, and reviewed the above assessment and plan.  Changes to above are made where necessary.    Co Sign: Hillis Range, MD 02/16/2015 10:03 AM

## 2015-02-15 NOTE — Progress Notes (Signed)
Paged Cards on-call for Ibuprofen orders, per pts preference for mild pain post pacer implant

## 2015-02-16 ENCOUNTER — Encounter (HOSPITAL_COMMUNITY): Payer: Self-pay | Admitting: Internal Medicine

## 2015-02-16 ENCOUNTER — Inpatient Hospital Stay (HOSPITAL_COMMUNITY): Payer: BLUE CROSS/BLUE SHIELD

## 2015-02-16 DIAGNOSIS — T82190A Other mechanical complication of cardiac electrode, initial encounter: Secondary | ICD-10-CM

## 2015-02-16 LAB — BASIC METABOLIC PANEL
Anion gap: 9 (ref 5–15)
BUN: 23 mg/dL — AB (ref 6–20)
CALCIUM: 8.9 mg/dL (ref 8.9–10.3)
CHLORIDE: 104 mmol/L (ref 101–111)
CO2: 24 mmol/L (ref 22–32)
Creatinine, Ser: 1.44 mg/dL — ABNORMAL HIGH (ref 0.61–1.24)
GFR calc non Af Amer: 47 mL/min — ABNORMAL LOW (ref >60)
GFR, EST AFRICAN AMERICAN: 55 mL/min — AB (ref >60)
Glucose, Bld: 105 mg/dL — ABNORMAL HIGH (ref 65–99)
POTASSIUM: 4.1 mmol/L (ref 3.5–5.1)
SODIUM: 137 mmol/L (ref 135–145)

## 2015-02-16 MED FILL — Sodium Chloride Irrigation Soln 0.9%: Qty: 500 | Status: AC

## 2015-02-16 MED FILL — Gentamicin Sulfate Inj 40 MG/ML: INTRAMUSCULAR | Qty: 2 | Status: AC

## 2015-02-16 MED FILL — Heparin Sodium (Porcine) 2 Unit/ML in Sodium Chloride 0.9%: INTRAMUSCULAR | Qty: 500 | Status: AC

## 2015-02-16 NOTE — Discharge Instructions (Signed)
° ° °  Supplemental Discharge Instructions for  Pacemaker/Defibrillator Patients  Activity No heavy lifting or vigorous activity with your left/right arm for 6 to 8 weeks.  Do not raise your left/right arm above your head for one week.  Gradually raise your affected arm as drawn below.           __      02/20/15                 02/21/15                      02/22/15                  02/23/15  NO DRIVING until wound check appointment   WOUND CARE - Keep the wound area clean and dry.  Do not get this area wet for one week. No showers for one week; you may shower on   02/23/15  . - The tape/steri-strips on your wound will fall off; do not pull them off.  No bandage is needed on the site.  DO  NOT apply any creams, oils, or ointments to the wound area. - If you notice any drainage or discharge from the wound, any swelling or bruising at the site, or you develop a fever > 101? F after you are discharged home, call the office at once.  Special Instructions - You are still able to use cellular telephones; use the ear opposite the side where you have your pacemaker/defibrillator.  Avoid carrying your cellular phone near your device. - When traveling through airports, show security personnel your identification card to avoid being screened in the metal detectors.  Ask the security personnel to use the hand wand. - Avoid arc welding equipment, MRI testing (magnetic resonance imaging), TENS units (transcutaneous nerve stimulators).  Call the office for questions about other devices. - Avoid electrical appliances that are in poor condition or are not properly grounded. - Microwave ovens are safe to be near or to operate.

## 2015-02-20 ENCOUNTER — Telehealth: Payer: Self-pay | Admitting: Internal Medicine

## 2015-02-20 NOTE — Telephone Encounter (Signed)
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  Thank you.

## 2015-02-20 NOTE — Telephone Encounter (Signed)
Spoke w/ pt who states Dr. Johney Frame directly stated he needs to stay home from work for six weeks post ppm revision/upgrade. 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 Pt reiterated his desire to return to work sooner.   Pt aware of wound check appt 02/26/15.   Note sent to Amber to verify duration of returning to work.

## 2015-02-20 NOTE — Telephone Encounter (Signed)
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 Johney Frame that he would need to be out of work 6 weeks.  Patient states he needs a note sent today to his manager stating that he is out 6 weeks and then patient would like Dr. Johney Frame to approve him possibly going back to work sooner.  He has wound check appointment on 6/20.  I advised that Dr. Johney Frame is not in the office this week and that I will route message to [facility] to fax work note to number below for the 6 week work release.  Patient verbalized understanding and agreement.

## 2015-02-20 NOTE — Telephone Encounter (Signed)
New message      Pt had a defib replaced.  He needs Dr Johney Frame to fax to his job an approx return to work date.  Please fax to [phone] attn Milagros Evener.  He needs this done as soon as someone can do it.  If any problems, please call pt

## 2015-02-21 ENCOUNTER — Telehealth: Payer: Self-pay | Admitting: *Deleted

## 2015-02-21 ENCOUNTER — Encounter: Payer: Self-pay | Admitting: *Deleted

## 2015-02-21 NOTE — Telephone Encounter (Signed)
Updated pt to let him know fax was sent today.  Pt aware ROV 02/26/15.

## 2015-02-26 ENCOUNTER — Ambulatory Visit (INDEPENDENT_AMBULATORY_CARE_PROVIDER_SITE_OTHER): Payer: BLUE CROSS/BLUE SHIELD | Admitting: *Deleted

## 2015-02-26 DIAGNOSIS — I442 Atrioventricular block, complete: Secondary | ICD-10-CM

## 2015-02-26 DIAGNOSIS — Z45018 Encounter for adjustment and management of other part of cardiac pacemaker: Secondary | ICD-10-CM

## 2015-02-26 DIAGNOSIS — I5022 Chronic systolic (congestive) heart failure: Secondary | ICD-10-CM

## 2015-02-26 LAB — CUP PACEART INCLINIC DEVICE CHECK
Battery Voltage: 3.06 V
Brady Statistic AP VS Percent: 0.01 %
Brady Statistic AS VP Percent: 88.1 %
Brady Statistic AS VS Percent: 0.94 %
Brady Statistic RA Percent Paced: 10.97 %
Date Time Interrogation Session: 20160620172216
Lead Channel Impedance Value: 475 Ohm
Lead Channel Impedance Value: 494 Ohm
Lead Channel Impedance Value: 646 Ohm
Lead Channel Impedance Value: 646 Ohm
Lead Channel Impedance Value: 798 Ohm
Lead Channel Pacing Threshold Amplitude: 1.75 V
Lead Channel Pacing Threshold Pulse Width: 0.4 ms
Lead Channel Pacing Threshold Pulse Width: 0.4 ms
Lead Channel Sensing Intrinsic Amplitude: 3.375 mV
Lead Channel Sensing Intrinsic Amplitude: 3.875 mV
Lead Channel Setting Pacing Amplitude: 3.5 V
Lead Channel Setting Pacing Amplitude: 3.5 V
Lead Channel Setting Pacing Pulse Width: 0.4 ms
MDC IDC MSMT LEADCHNL LV IMPEDANCE VALUE: 418 Ohm
MDC IDC MSMT LEADCHNL LV IMPEDANCE VALUE: 722 Ohm
MDC IDC MSMT LEADCHNL RA IMPEDANCE VALUE: 342 Ohm
MDC IDC MSMT LEADCHNL RA IMPEDANCE VALUE: 513 Ohm
MDC IDC MSMT LEADCHNL RA PACING THRESHOLD AMPLITUDE: 0.75 V
MDC IDC MSMT LEADCHNL RV PACING THRESHOLD AMPLITUDE: 0.75 V
MDC IDC MSMT LEADCHNL RV PACING THRESHOLD PULSEWIDTH: 0.4 ms
MDC IDC SET LEADCHNL RA PACING AMPLITUDE: 3.5 V
MDC IDC SET LEADCHNL RV PACING PULSEWIDTH: 0.4 ms
MDC IDC SET LEADCHNL RV SENSING SENSITIVITY: 8 mV
MDC IDC STAT BRADY AP VP PERCENT: 10.96 %
MDC IDC STAT BRADY RV PERCENT PACED: 99.06 %
Zone Setting Detection Interval: 350 ms
Zone Setting Detection Interval: 400 ms

## 2015-02-26 NOTE — Progress Notes (Signed)
Revision & Wound check appointment. Steri-strips removed. Wound without redness or edema. Incision edges approximated, wound well healed. Normal device function. Thresholds, sensing, and impedances consistent with implant measurements. Device programmed at 3.5V/auto capture programmed on for extra safety margin until 3 month visit. Histogram distribution appropriate for patient and level of activity. No mode switches or high ventricular rates noted. Patient educated about wound care, arm mobility, lifting restrictions. ROV w/ JA 05/30/15.

## 2015-02-28 ENCOUNTER — Telehealth: Payer: Self-pay | Admitting: Internal Medicine

## 2015-02-28 NOTE — Telephone Encounter (Signed)
Received Walk-in form with FMLA attached without payment sent to [employer].  02/28/15  ltd

## 2015-03-02 ENCOUNTER — Telehealth: Payer: Self-pay | Admitting: Internal Medicine

## 2015-03-02 NOTE — Telephone Encounter (Signed)
New message      Returning a call to [employer]

## 2015-03-05 NOTE — Telephone Encounter (Addendum)
Spoke with patient and let him know i had not called him and maybe it was someone from [employer]   He says he needs his papers signed today as he needs to take to his PCP tomorrow.  He is going to call [employer] and pick the papers up himself if needed

## 2015-03-06 NOTE — Telephone Encounter (Signed)
Papers complete and MR called to come pick up

## 2015-03-07 ENCOUNTER — Encounter: Payer: Self-pay | Admitting: Internal Medicine

## 2015-03-13 ENCOUNTER — Encounter: Payer: Self-pay | Admitting: Internal Medicine

## 2015-03-13 ENCOUNTER — Telehealth: Payer: Self-pay | Admitting: Internal Medicine

## 2015-03-13 ENCOUNTER — Telehealth: Payer: Self-pay | Admitting: Cardiology

## 2015-03-13 ENCOUNTER — Ambulatory Visit (INDEPENDENT_AMBULATORY_CARE_PROVIDER_SITE_OTHER): Payer: BLUE CROSS/BLUE SHIELD | Admitting: *Deleted

## 2015-03-13 DIAGNOSIS — T82897A Other specified complication of cardiac prosthetic devices, implants and grafts, initial encounter: Secondary | ICD-10-CM

## 2015-03-13 LAB — CUP PACEART INCLINIC DEVICE CHECK
Battery Remaining Longevity: 92 mo
Battery Voltage: 3.04 V
Brady Statistic AP VP Percent: 3.55 %
Brady Statistic AP VS Percent: 0.01 %
Date Time Interrogation Session: 20160705164240
Lead Channel Impedance Value: 361 Ohm
Lead Channel Impedance Value: 437 Ohm
Lead Channel Impedance Value: 589 Ohm
Lead Channel Impedance Value: 627 Ohm
Lead Channel Impedance Value: 798 Ohm
Lead Channel Impedance Value: 836 Ohm
Lead Channel Pacing Threshold Amplitude: 0.75 V
Lead Channel Pacing Threshold Amplitude: 2.125 V
Lead Channel Pacing Threshold Pulse Width: 0.4 ms
Lead Channel Pacing Threshold Pulse Width: 0.4 ms
Lead Channel Sensing Intrinsic Amplitude: 4.375 mV
Lead Channel Sensing Intrinsic Amplitude: 4.375 mV
Lead Channel Setting Pacing Amplitude: 3.5 V
Lead Channel Setting Pacing Amplitude: 3.5 V
Lead Channel Setting Pacing Amplitude: 3.5 V
Lead Channel Setting Pacing Pulse Width: 0.4 ms
Lead Channel Setting Sensing Sensitivity: 8 mV
MDC IDC MSMT LEADCHNL LV IMPEDANCE VALUE: 1121 Ohm
MDC IDC MSMT LEADCHNL LV IMPEDANCE VALUE: 646 Ohm
MDC IDC MSMT LEADCHNL RA IMPEDANCE VALUE: 513 Ohm
MDC IDC MSMT LEADCHNL RV PACING THRESHOLD AMPLITUDE: 0.875 V
MDC IDC MSMT LEADCHNL RV PACING THRESHOLD PULSEWIDTH: 0.4 ms
MDC IDC SET LEADCHNL LV PACING PULSEWIDTH: 0.4 ms
MDC IDC SET ZONE DETECTION INTERVAL: 350 ms
MDC IDC SET ZONE DETECTION INTERVAL: 400 ms
MDC IDC STAT BRADY AS VP PERCENT: 95.32 %
MDC IDC STAT BRADY AS VS PERCENT: 1.13 %
MDC IDC STAT BRADY RA PERCENT PACED: 3.55 %
MDC IDC STAT BRADY RV PERCENT PACED: 98.86 %

## 2015-03-13 NOTE — Telephone Encounter (Signed)
I attempted to contact patient, but had to leave a message.  The patient does not have a history of Afib.  I will await his return call for more information concerning his symptoms.

## 2015-03-13 NOTE — Telephone Encounter (Signed)
Returning your call. °

## 2015-03-13 NOTE — Telephone Encounter (Signed)
I spoke with patient.  He reports that he feels that his heart beat is intense.  He feels like it is a "hiccup" in his chest that has been occuring over the weekend but doesn't actually hiccup.  He denies chest pain, palpitations, and SOB. Patient will be seen this afternoon in the device clinic at [address].  He is agreeable and appreciative.

## 2015-03-13 NOTE — Telephone Encounter (Signed)
New message    Patient calling C/O AFIB since the last appt. Patient thought it was a hiccup.    Would like to be seen today.

## 2015-03-13 NOTE — Progress Notes (Signed)
Pt having continuous diaph stim. High LV threshold noted on 03/04/15. LV threshold today 2.0V@04 .ms. Changed LV output from 5.5 to 3.5V. Turned off LV auto capture. ROV as planned w/ JA 05/30/15.

## 2015-03-13 NOTE — Telephone Encounter (Signed)
Please see other telephone encounter for additional details

## 2015-03-13 NOTE — Telephone Encounter (Signed)
Patient had wire revision by Dr. Rosalene Billings.  Dr. Peter Swaziland at [facility] is listed as patients cardiologist.  [facility] triage called and talked to [place].  I told her in a quick review of the patient record his primary cardiologist is Dr. Swaziland at [facility].  We had a message that came into our triage that the patient called today and c/o atrial fib since last appointment and would like to be seen today.  I told her the patient has not received a return call by [place].

## 2015-03-16 ENCOUNTER — Encounter: Payer: Self-pay | Admitting: Internal Medicine

## 2015-03-26 ENCOUNTER — Ambulatory Visit: Payer: BLUE CROSS/BLUE SHIELD | Admitting: Diagnostic Neuroimaging

## 2015-03-30 ENCOUNTER — Telehealth: Payer: Self-pay | Admitting: Internal Medicine

## 2015-03-30 ENCOUNTER — Encounter: Payer: Self-pay | Admitting: *Deleted

## 2015-03-30 NOTE — Telephone Encounter (Signed)
New Message  Pt calling to speak w/ RN- concerning restrictions and return date for work. Please call back and discuss.

## 2015-03-30 NOTE — Telephone Encounter (Signed)
Fax number is [phone] atn Albesa Seen  He is going to return to work on 04/04/15 with no restrictions

## 2015-04-25 ENCOUNTER — Encounter: Payer: Self-pay | Admitting: Nurse Practitioner

## 2015-04-25 ENCOUNTER — Ambulatory Visit (INDEPENDENT_AMBULATORY_CARE_PROVIDER_SITE_OTHER): Payer: BLUE CROSS/BLUE SHIELD | Admitting: Nurse Practitioner

## 2015-04-25 ENCOUNTER — Ambulatory Visit: Payer: BLUE CROSS/BLUE SHIELD | Admitting: Cardiology

## 2015-04-25 VITALS — BP 136/80 | HR 86 | Ht 69.0 in | Wt 233.8 lb

## 2015-04-25 DIAGNOSIS — I442 Atrioventricular block, complete: Secondary | ICD-10-CM

## 2015-04-25 DIAGNOSIS — T82897D Other specified complication of cardiac prosthetic devices, implants and grafts, subsequent encounter: Secondary | ICD-10-CM

## 2015-04-25 DIAGNOSIS — I5022 Chronic systolic (congestive) heart failure: Secondary | ICD-10-CM | POA: Diagnosis not present

## 2015-04-25 NOTE — Patient Instructions (Signed)
Medication Instructions:   Your physician recommends that you continue on your current medications as directed. Please refer to the Current Medication list given to you today.   Labwork:  NONE ORDER TODAY   Testing/Procedures:   NONE ORDER TODAY   Follow-Up:  RESCHEDULE SEPT APPT ALLRED    FOR AN APPT  IN OCT PPM   Any Other Special Instructions Will Be Listed Below (If Applicable).

## 2015-04-25 NOTE — Progress Notes (Signed)
Electrophysiology Office Note Date: 04/25/2015  ID:  [name], [name] 06/27/1946, MRN [id number]  PCP: [name], MD Primary Cardiologist: Swaziland Electrophysiologist: Allred  CC: Pacemaker and heart failure follow-up  [name] is a 69 y.o. male seen today for Dr Swaziland.  He was recently found to have atrial and ventricular lead failure and underwent RA and RV lead revisions as well as upgrade to CRTP last month.  Since discharge, the patient reports doing very well.  He has had persistent occasional diaphragmatic stim.  He denies chest pain, palpitations, dyspnea, PND, orthopnea, nausea, vomiting, dizziness, syncope, edema, weight gain, or early satiety.  Device History: MDT dual chamber PPM implanted 2010 for CHB; RA and RV lead failure 2016 with replacement of RA and RV leads and upgrade to [facility] 02/2015   Past Medical History  Diagnosis Date  . Complete heart block     s/p PPM by Dr Reyes Ivan  . Chronic renal insufficiency     WITH BASELINE CREATININE OF 1.6  . Hypertension   . Coronary disease     NONOBSTRUCTIVE  . Hyperkalemia   . Chronic systolic CHF (congestive heart failure)   . Nonischemic cardiomyopathy   . Gout   . Presence of permanent cardiac pacemaker   . GERD (gastroesophageal reflux disease)    Past Surgical History  Procedure Laterality Date  . Left and right heart catheterization with coronary angiogram N/A 08/16/2013    Procedure: LEFT AND RIGHT HEART CATHETERIZATION WITH CORONARY ANGIOGRAM;  Surgeon: [name] Swaziland, MD;  Location: [facility] CATH LAB;  Service: Cardiovascular;  Laterality: N/A;  . Insert / replace / remove pacemaker  05/20/2009    MDT by Dr Reyes Ivan for complete heart block  . Inguinal hernia repair Bilateral   . Cardiac catheterization  08/2013  . Ep implantable device N/A 02/15/2015    Procedure: Lead Revision/Repair;  Surgeon: Hillis Range, MD;  Location: [facility] INVASIVE CV LAB;  Service: Cardiovascular;  Laterality: N/A;    Current  Outpatient Prescriptions  Medication Sig Dispense Refill  . amLODipine (NORVASC) 5 MG tablet Take 5 mg by mouth daily.    . Ascorbic Acid (VITAMIN C) 1000 MG tablet Take 2,000 mg by mouth daily.     Marland Kitchen aspirin 81 MG tablet Take 81 mg by mouth daily.      . carvedilol (COREG) 12.5 MG tablet Take 1 tablet (12.5 mg total) by mouth 2 (two) times daily. 180 tablet 3  . Cholecalciferol (VITAMIN D-3) 1000 UNITS CAPS Take 1,000 Units by mouth daily.    . Colchicine 0.6 MG CAPS Take 1 tablet by mouth as needed (gout).     . Fluticasone-Salmeterol (ADVAIR) 250-50 MCG/DOSE AEPB Inhale 1 puff into the lungs as needed (shortness of breath).     . furosemide (LASIX) 40 MG tablet Take 1 tablet (40 mg total) by mouth daily. (Patient taking differently: Take 40 mg by mouth daily as needed for fluid. ) 30 tablet 6  . GARLIC PO Take 1 tablet by mouth daily.     . Glucosamine HCl (GLUCOSAMINE PO) Take 1 tablet by mouth daily.     . Hydrocodone-Acetaminophen 5-300 MG TABS TAKE 1 TABLET BY MOUTH EVERY 6 TO 8 HOURS AS NEEDED FOR PAIN  0  . ibuprofen (ADVIL,MOTRIN) 200 MG tablet Take 200 mg by mouth every 6 (six) hours as needed for mild pain or moderate pain.     No current facility-administered medications for this visit.    Allergies:  Avalide; Penicillins; and Tinactin   Social History: Social History   Social History  . Marital Status: Divorced    Spouse Name: N/A  . Number of Children: 1  . Years of Education: 14   Occupational History  . Curator    Social History Main Topics  . Smoking status: Former Smoker -- 1.00 packs/day for 15 years    Types: Cigarettes  . Smokeless tobacco: Never Used     Comment: "quit smoking cigarettes in 1985"  . Alcohol Use: 4.2 oz/week    0 Standard drinks or equivalent, 7 Shots of liquor per week  . Drug Use: No  . Sexual Activity: Not Currently   Other Topics Concern  . Not on file   Social History Narrative   Lives in [place].   Drinks a couple of  coffees a day   Lives at home alone with his dog    Family History: Family History  Problem Relation Age of Onset  . Diabetes Father      Review of Systems: All other systems reviewed and are otherwise negative except as noted above.   Physical Exam: VS:  BP 136/80 mmHg  Pulse 86  Ht 5\' 9"  (1.753 m)  Wt 233 lb 12.8 oz (106.051 kg)  BMI 34.51 kg/m2  SpO2 96% , BMI Body mass index is 34.51 kg/(m^2).  GEN- The patient is obese appearing, alert and oriented x 3 today.   HEENT: normocephalic, atraumatic; sclera clear, conjunctiva pink; hearing intact; oropharynx clear; neck supple Lungs- Clear to ausculation bilaterally, normal work of breathing.  No wheezes, rales, rhonchi Heart- Regular rate and rhythm (paced) GI- soft, non-tender, non-distended, bowel sounds present  Extremities- no clubbing, cyanosis, or edema; DP/PT/radial pulses 2+ bilaterally MS- no significant deformity or atrophy Skin- warm and dry, no rash or lesion; PPM pocket well healed Psych- euthymic mood, full affect Neuro- strength and sensation are intact  PPM Interrogation- reviewed in detail today,  See PACEART report  EKG:  EKG is not ordered today.  Recent Labs: 02/14/2015: Hemoglobin 14.3; Platelets 150 02/16/2015: BUN 23*; Creatinine, Ser 1.44*; Potassium 4.1; Sodium 137   Wt Readings from Last 3 Encounters:  04/25/15 233 lb 12.8 oz (106.051 kg)  02/16/15 104 lb 9.6 oz (47.446 kg)  02/14/15 232 lb 6.4 oz (105.416 kg)     Other studies Reviewed: Additional studies/ records that were reviewed today include:Dr Allred's office notes, hospital records  Assessment and Plan:  1.  Complete heart block Normal PPM function - pt is dependent today See Pace Art report No changes today  2.  Chronic systolic heart failure Euvolemic on exam Continue medical therapy  3.  Diaphragmatic stim LV threshold today 1.75V- device reprogrammed to 2.75V without recurrent diaphragmatic stim   Current medicines  are reviewed at length with the patient today.   The patient does not have concerns regarding his medicines.  The following changes were made today:  none  Labs/ tests ordered today include: none    Disposition:   Follow up with Dr Johney Frame in 6 weeks, Dr Swaziland as scheduled    Signed, Gypsy Balsam, NP 04/25/2015 3:29 PM  [facility] [address] [place] [address] [phone] (office) [phone] (fax)

## 2015-04-30 LAB — CUP PACEART INCLINIC DEVICE CHECK
Lead Channel Setting Pacing Amplitude: 3.5 V
Lead Channel Setting Pacing Amplitude: 3.5 V
Lead Channel Setting Sensing Sensitivity: 8 mV
MDC IDC SESS DTM: 20160822133828
MDC IDC SET LEADCHNL LV PACING PULSEWIDTH: 0.4 ms
MDC IDC SET LEADCHNL RV PACING AMPLITUDE: 3.5 V
MDC IDC SET LEADCHNL RV PACING PULSEWIDTH: 0.4 ms
MDC IDC SET ZONE DETECTION INTERVAL: 350 ms
MDC IDC SET ZONE DETECTION INTERVAL: 400 ms

## 2015-05-04 ENCOUNTER — Telehealth: Payer: Self-pay | Admitting: Internal Medicine

## 2015-05-04 NOTE — Telephone Encounter (Signed)
New message      Did we get a form from [employer] absentee management?  They have not received the paperwork back.  Please call pt and give an update

## 2015-05-04 NOTE — Telephone Encounter (Signed)
Calling wanting to know if we received the form from his company[employer]- an absentee management program.  Advised that Dr. Johney Frame nurse's Anselm Pancoast not in office today but will forward message for her to follow up the first of the week.

## 2015-05-08 ENCOUNTER — Telehealth: Payer: Self-pay | Admitting: Internal Medicine

## 2015-05-08 NOTE — Telephone Encounter (Addendum)
Spoke with [name] and he is going to have [employer] call me so we can she what exactly is missing from form

## 2015-05-08 NOTE — Telephone Encounter (Signed)
FMLA faxed to [employer] @ [phone]

## 2015-05-08 NOTE — Telephone Encounter (Signed)
Follow up      Calling to talk to [facility] to see if we faxed the paperwork back to [employer].  They did not receive it.  Please call

## 2015-05-09 NOTE — Telephone Encounter (Signed)
GAve the [employer] person date of 02/14/15 for his out of work date

## 2015-05-11 ENCOUNTER — Encounter: Payer: Self-pay | Admitting: Cardiology

## 2015-05-11 ENCOUNTER — Ambulatory Visit (INDEPENDENT_AMBULATORY_CARE_PROVIDER_SITE_OTHER): Payer: BLUE CROSS/BLUE SHIELD | Admitting: Cardiology

## 2015-05-11 VITALS — BP 150/90 | HR 68 | Ht 69.0 in | Wt 236.6 lb

## 2015-05-11 DIAGNOSIS — N183 Chronic kidney disease, stage 3 unspecified: Secondary | ICD-10-CM

## 2015-05-11 DIAGNOSIS — I442 Atrioventricular block, complete: Secondary | ICD-10-CM | POA: Diagnosis not present

## 2015-05-11 DIAGNOSIS — I5022 Chronic systolic (congestive) heart failure: Secondary | ICD-10-CM

## 2015-05-11 DIAGNOSIS — I10 Essential (primary) hypertension: Secondary | ICD-10-CM | POA: Diagnosis not present

## 2015-05-11 NOTE — Progress Notes (Signed)
[name] Date of Birth: 02-17-1946   History of Present Illness: [name] is seen today for followup of congestive heart failure. He has a history of complete heart block and presented in September of 2010 with a heart rate of 10. He had a permanent pacemaker placed at that time. He did have acute renal failure and hyperkalemia at the time. He was on either an ACE inhibitor or ARB at that time. In November 2014 he presented with worsening CHF.  Echocardiogram showed marked LV dysfunction with ejection fraction of 20-25%. He had moderate pulmonary HTN. He was started on nitrates and hydralazine. He underwent right and left heart cath on 08/16/13 with moderate pulmonary HTN and elevated filling pressures. 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   He declined ICD. In June 2016 he was found to have lead fractures of atrial and ventricular leads and underwent revision of pacemaker leads and upgrade to CRT device.   Since then he states he has felt great. He denies any SOB or chest pain. No dizziness or palpitations. No edema. Weight is stable.   Current Outpatient Prescriptions on File Prior to Visit  Medication Sig Dispense Refill  . amLODipine (NORVASC) 5 MG tablet Take 5 mg by mouth daily.    . Ascorbic Acid (VITAMIN C) 1000 MG tablet Take 2,000 mg by mouth daily.     Marland Kitchen aspirin 81 MG tablet Take 81 mg by mouth daily.      . carvedilol (COREG) 12.5 MG tablet Take 1 tablet (12.5 mg total) by mouth 2 (two) times daily. 180 tablet 3  . Cholecalciferol (VITAMIN D-3) 1000 UNITS CAPS Take 1,000 Units by mouth daily.    . Colchicine 0.6 MG CAPS Take 1 tablet by mouth as needed (gout).     . Fluticasone-Salmeterol (ADVAIR) 250-50 MCG/DOSE AEPB Inhale 1 puff into the lungs as needed (shortness of breath).     . furosemide (LASIX) 40 MG tablet Take 1 tablet (40 mg total) by mouth daily. (Patient taking differently: Take 40 mg by mouth daily as needed for fluid. ) 30 tablet 6  . GARLIC PO  Take 1 tablet by mouth daily.     . Glucosamine HCl (GLUCOSAMINE PO) Take 1 tablet by mouth daily.     . Hydrocodone-Acetaminophen 5-300 MG TABS TAKE 1 TABLET BY MOUTH EVERY 6 TO 8 HOURS AS NEEDED FOR PAIN  0   No current facility-administered medications on file prior to visit.    Allergies  Allergen Reactions  . Avalide [Irbesartan-Hydrochlorothiazide] Anaphylaxis  . Penicillins Anaphylaxis  . Tinactin [Tolnaftate] Hives    Rash     Past Medical History  Diagnosis Date  . Complete heart block     s/p PPM by [name]  . Chronic renal insufficiency     WITH BASELINE CREATININE OF 1.6  . Hypertension   . Coronary disease     NONOBSTRUCTIVE  . Hyperkalemia   . Chronic systolic CHF (congestive heart failure)   . Nonischemic cardiomyopathy   . Gout   . Presence of permanent cardiac pacemaker   . GERD (gastroesophageal reflux disease)     Past Surgical History  Procedure Laterality Date  . Left and right heart catheterization with coronary angiogram N/A 08/16/2013    Procedure: LEFT AND RIGHT HEART CATHETERIZATION WITH CORONARY ANGIOGRAM;  Surgeon: Gregory Nordahl M Swaziland, MD;  Location: [facility] CATH LAB;  Service: Cardiovascular;  Laterality: N/A;  . Insert / replace / remove pacemaker  05/20/2009    MDT by [name] for complete heart block  . Inguinal hernia repair Bilateral   . Cardiac catheterization  08/2013  . Ep implantable device N/A 02/15/2015    Procedure: Lead Revision/Repair;  Surgeon: Gregory Range, MD;  Location: [facility] INVASIVE CV LAB;  Service: Cardiovascular;  Laterality: N/A;    History  Smoking status  . Former Smoker -- 1.00 packs/day for 15 years  . Types: Cigarettes  Smokeless tobacco  . Never Used    Comment: "quit smoking cigarettes in 1985"    History  Alcohol Use  . 4.2 oz/week  . 0 Standard drinks or equivalent, 7 Shots of liquor per week    Family History  Problem Relation Age of Onset  . Diabetes Father     Review of Systems:  As note in HPI. All  other systems were reviewed and are negative.  Physical Exam: BP 150/90 mmHg  Pulse 68  Ht  (1.753 m)  Wt 107.321 kg (236 lb 9.6 oz)  BMI 34.92 kg/m2 Weight is up 5 lbs. He is an overweight black male in no acute distress. His HEENT exam is unremarkable. He has no JVD or bruits. Lungs are clear. Cardiac exam reveals a regular rate and rhythm. Normal S1 and S2. He has no gallop. His pacemaker site is well healed. Abdomen is soft and nontender without masses or bruits. He has good pedal pulses. He has no edema.  Skin is warm and dry. He is alert and oriented x3. Cranial nerves II through XII are intact.  LABORATORY DATA: Lab Results  Component Value Date   WBC 5.5 02/14/2015   HGB 14.3 02/14/2015   HCT 43.5 02/14/2015   PLT 150 02/14/2015   GLUCOSE 105* 02/16/2015   CHOL 162 07/28/2013   TRIG 54.0 07/28/2013   HDL 47.30 07/28/2013   LDLCALC 104* 07/28/2013   ALT 44 07/28/2013   AST 33 07/28/2013   NA 137 02/16/2015   K 4.1 02/16/2015   CL 104 02/16/2015   CREATININE 1.44* 02/16/2015   BUN 23* 02/16/2015   CO2 24 02/16/2015   TSH 1.67 07/28/2013   INR 1.18 02/14/2015     Assessment / Plan: 1. Chronic systolic congestive heart failure. Ejection fraction of 20-25%. No improvement in Echo despite optimal medical therapy.  Nonischemic.  He is intolerant to ACE inhibitors/ARBs related to hyperkalemia and acute renal failure. Continue coreg. Continue lasix to 40 mg daily.  He has been upgraded to a CRT device. May consider repeating Echo in a few months to see if EF improved.   2. CKD stage 3. Followed by Gregory. Hyman Bridges- Nephrology.  3 .HTN- control has been satisfactory.   4. Complete heart block status post permanent pacemaker.  5. 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 Gout.

## 2015-05-11 NOTE — Patient Instructions (Signed)
Continue your current therapy  I will see you in 6 months.   

## 2015-05-16 ENCOUNTER — Encounter: Payer: Self-pay | Admitting: Internal Medicine

## 2015-05-30 ENCOUNTER — Encounter: Payer: BLUE CROSS/BLUE SHIELD | Admitting: Internal Medicine

## 2015-06-13 ENCOUNTER — Ambulatory Visit (INDEPENDENT_AMBULATORY_CARE_PROVIDER_SITE_OTHER): Payer: BLUE CROSS/BLUE SHIELD | Admitting: Internal Medicine

## 2015-06-13 ENCOUNTER — Encounter: Payer: Self-pay | Admitting: Internal Medicine

## 2015-06-13 VITALS — BP 134/84 | HR 89 | Ht 69.0 in | Wt 235.8 lb

## 2015-06-13 DIAGNOSIS — I442 Atrioventricular block, complete: Secondary | ICD-10-CM | POA: Diagnosis not present

## 2015-06-13 DIAGNOSIS — I5022 Chronic systolic (congestive) heart failure: Secondary | ICD-10-CM

## 2015-06-13 LAB — CUP PACEART INCLINIC DEVICE CHECK
Battery Voltage: 3.03 V
Brady Statistic AP VP Percent: 1.89 %
Brady Statistic AP VS Percent: 0 %
Brady Statistic AS VP Percent: 97.76 %
Brady Statistic RA Percent Paced: 1.89 %
Brady Statistic RV Percent Paced: 99.65 %
Lead Channel Impedance Value: 1159 Ohm
Lead Channel Impedance Value: 380 Ohm
Lead Channel Impedance Value: 551 Ohm
Lead Channel Impedance Value: 646 Ohm
Lead Channel Impedance Value: 703 Ohm
Lead Channel Impedance Value: 912 Ohm
Lead Channel Pacing Threshold Amplitude: 1 V
Lead Channel Pacing Threshold Pulse Width: 0.4 ms
Lead Channel Pacing Threshold Pulse Width: 0.4 ms
Lead Channel Sensing Intrinsic Amplitude: 15.125 mV
Lead Channel Sensing Intrinsic Amplitude: 4.5 mV
Lead Channel Setting Pacing Amplitude: 2 V
Lead Channel Setting Pacing Amplitude: 2.75 V
Lead Channel Setting Pacing Pulse Width: 0.4 ms
Lead Channel Setting Pacing Pulse Width: 0.4 ms
MDC IDC MSMT BATTERY REMAINING LONGEVITY: 92 mo
MDC IDC MSMT LEADCHNL LV IMPEDANCE VALUE: 855 Ohm
MDC IDC MSMT LEADCHNL LV PACING THRESHOLD AMPLITUDE: 2.125 V
MDC IDC MSMT LEADCHNL LV PACING THRESHOLD PULSEWIDTH: 0.4 ms
MDC IDC MSMT LEADCHNL RA SENSING INTR AMPL: 4.375 mV
MDC IDC MSMT LEADCHNL RV IMPEDANCE VALUE: 475 Ohm
MDC IDC MSMT LEADCHNL RV IMPEDANCE VALUE: 627 Ohm
MDC IDC MSMT LEADCHNL RV PACING THRESHOLD AMPLITUDE: 0.875 V
MDC IDC SESS DTM: 20161005165359
MDC IDC SET LEADCHNL RV PACING AMPLITUDE: 2.5 V
MDC IDC SET LEADCHNL RV SENSING SENSITIVITY: 8 mV
MDC IDC SET ZONE DETECTION INTERVAL: 400 ms
MDC IDC STAT BRADY AS VS PERCENT: 0.35 %
Zone Setting Detection Interval: 350 ms

## 2015-06-13 NOTE — Patient Instructions (Addendum)
Medication Instructions:  Your physician recommends that you continue on your current medications as directed. Please refer to the Current Medication list given to you today.   Labwork: None ordered    Testing/Procedures: None ordered   Follow-Up: Your physician wants you to follow-up in: 12 months with Gregory Balsam, NP You will receive a reminder letter in the mail two months in advance. If you don't receive a letter, please call our office to schedule the follow-up appointment.   Remote monitoring is used to monitor your Pacemaker of ICD from home. This monitoring reduces the number of office visits required to check your device to one time per year. It allows [place] to keep an eye on the functioning of your device to ensure it is working properly. You are scheduled for a device check from home on 09/12/15. You may send your transmission at any time that day. If you have a wireless device, the transmission will be sent automatically. After your physician reviews your transmission, you will receive a postcard with your next transmission date.    Any Other Special Instructions Will Be Listed Below (If Applicable).

## 2015-06-15 NOTE — Progress Notes (Signed)
Electrophysiology Office Note   Date:  06/15/2015   ID:  Gregory, Bridges [birth year], MRN [id number]  PCP:  [name], MD  Cardiologist:  Dr Swaziland Primary Electrophysiologist: Hillis Range, MD    Chief Complaint  Patient presents with  . CHB  . Chronic systolic CHF     History of Present Illness: [name] is a 69 y.o. male who presents today for electrophysiology evaluation.   The patient presents today for EP evaluation.  He has done well since his recent pacemaker lead revision with upgrade to CRT-P.  He has healed nicely and denies procedure related complications.  Today, he denies symptoms of palpitations, chest pain, shortness of breath, orthopnea, PND, lower extremity edema, claudication,  syncope, bleeding, or neurologic sequela. The patient is tolerating medications without difficulties and is otherwise without complaint today.    Past Medical History  Diagnosis Date  . Complete heart block (HCC)     s/p PPM by Dr Reyes Ivan  . Chronic renal insufficiency     WITH BASELINE CREATININE OF 1.6  . Hypertension   . Coronary disease     NONOBSTRUCTIVE  . Hyperkalemia   . Chronic systolic CHF (congestive heart failure) (HCC)   . Nonischemic cardiomyopathy (HCC)   . Gout   . Presence of permanent cardiac pacemaker   . GERD (gastroesophageal reflux disease)    Past Surgical History  Procedure Laterality Date  . Left and right heart catheterization with coronary angiogram N/A 08/16/2013    Procedure: LEFT AND RIGHT HEART CATHETERIZATION WITH CORONARY ANGIOGRAM;  Surgeon: [name] Swaziland, MD;  Location: [facility] CATH LAB;  Service: Cardiovascular;  Laterality: N/A;  . Insert / replace / remove pacemaker  05/20/2009    MDT by Dr Reyes Ivan for complete heart block  . Inguinal hernia repair Bilateral   . Cardiac catheterization  08/2013  . Ep implantable device N/A 02/15/2015    Procedure: Lead Revision/Repair;  Surgeon: Hillis Range, MD;  Location: [facility] INVASIVE CV LAB;   Service: Cardiovascular;  Laterality: N/A;     Current Outpatient Prescriptions  Medication Sig Dispense Refill  . amLODipine (NORVASC) 5 MG tablet Take 5 mg by mouth daily.    . Ascorbic Acid (VITAMIN C) 1000 MG tablet Take 2,000 mg by mouth daily.     Marland Kitchen aspirin 81 MG tablet Take 81 mg by mouth daily.      . Cholecalciferol (VITAMIN D-3) 1000 UNITS CAPS Take 1,000 Units by mouth daily.    . Colchicine 0.6 MG CAPS Take 1 tablet by mouth daily as needed (gout).     . Fluticasone-Salmeterol (ADVAIR) 250-50 MCG/DOSE AEPB Inhale 1 puff into the lungs as needed (shortness of breath).     . furosemide (LASIX) 40 MG tablet Take 40 mg by mouth daily as needed (swelling).    . GARLIC PO Take 1 tablet by mouth daily.     . Glucosamine HCl (GLUCOSAMINE PO) Take 1 tablet by mouth daily.     . Hydrocodone-Acetaminophen 5-300 MG TABS TAKE 1 TABLET BY MOUTH EVERY 6 TO 8 HOURS AS NEEDED FOR PAIN  0  . carvedilol (COREG) 12.5 MG tablet Take 1 tablet (12.5 mg total) by mouth 2 (two) times daily. (Patient not taking: Reported on 06/13/2015) 180 tablet 3   No current facility-administered medications for this visit.    Allergies:   Avalide; Penicillins; and Tinactin   Social History:  The patient  reports that he has quit smoking. His smoking use included  Cigarettes. He has a 15 pack-year smoking history. He has never used smokeless tobacco. He reports that he drinks about 4.2 oz of alcohol per week. He reports that he does not use illicit drugs.   Family History:  The patient's family history includes Diabetes in his father.    ROS:  Please see the history of present illness.   All other systems are reviewed and negative.    PHYSICAL EXAM: VS:  BP 134/84 mmHg  Pulse 89  Ht 5\' 9"  (1.753 m)  Wt 235 lb 12.8 oz (106.958 kg)  BMI 34.81 kg/m2 , BMI Body mass index is 34.81 kg/(m^2). GEN: Well nourished, well developed, in no acute distress HEENT: normal Neck: no JVD, carotid bruits, or masses Cardiac:  RRR; no murmurs, rubs, or gallops,no edema  Respiratory:  clear to auscultation bilaterally, normal work of breathing GI: soft, nontender, nondistended, + BS MS: no deformity or atrophy Skin: warm and dry, device pocket is well healed Neuro:  Strength and sensation are intact Psych: euthymic mood, full affect  Device interrogation is reviewed today in detail.  See PaceArt for details.   Recent Labs: 02/14/2015: Hemoglobin 14.3; Platelets 150 02/16/2015: BUN 23*; Creatinine, Ser 1.44*; Potassium 4.1; Sodium 137    Lipid Panel     Component Value Date/Time   CHOL 162 07/28/2013 0746   TRIG 54.0 07/28/2013 0746   HDL 47.30 07/28/2013 0746   CHOLHDL 3 07/28/2013 0746   VLDL 10.8 07/28/2013 0746   LDLCALC 104* 07/28/2013 0746     Wt Readings from Last 3 Encounters:  06/13/15 235 lb 12.8 oz (106.958 kg)  05/11/15 236 lb 9.6 oz (107.321 kg)  04/25/15 233 lb 12.8 oz (106.051 kg)      Other studies Reviewed: Additional studies/ records that were reviewed today include: Dr Illa Level notes, Echo from 2015  Review of the above records today demonstrates: EF 25%   ASSESSMENT AND PLAN:  1.  Complete heart block Doing very well s/p lead revision and upgrade to CRT-P.  Normal device function today. The importance of compliance with remote monitoring was stressed today.  2. Chronic systolic dysfunction Doing well s/p upgrade to CRT-P Would consider repeat echo when he sees Dr Swaziland in follow-up.  Current medicines are reviewed at length with the patient today.   The patient does not have concerns regarding his medicines.  The following changes were made today:  none  Labs/ tests ordered today include:  Orders Placed This Encounter  Procedures  . Implantable device check   Follow-up: [facility] Return to see EP NP in 1 year  Signed, Hillis Range, MD  06/15/2015 2:38 PM     [facility] [address] [place] [address] [phone]  (office) [phone] (fax)

## 2015-06-28 IMAGING — CR DG CHEST 2V
2 series · 2 of 2 positions shown · non-contrast
Comparison: 09/05/2010.

CLINICAL DATA: Shortness of breath.

EXAM:
CHEST  2 VIEW

[view not recorded (1 of 2)]
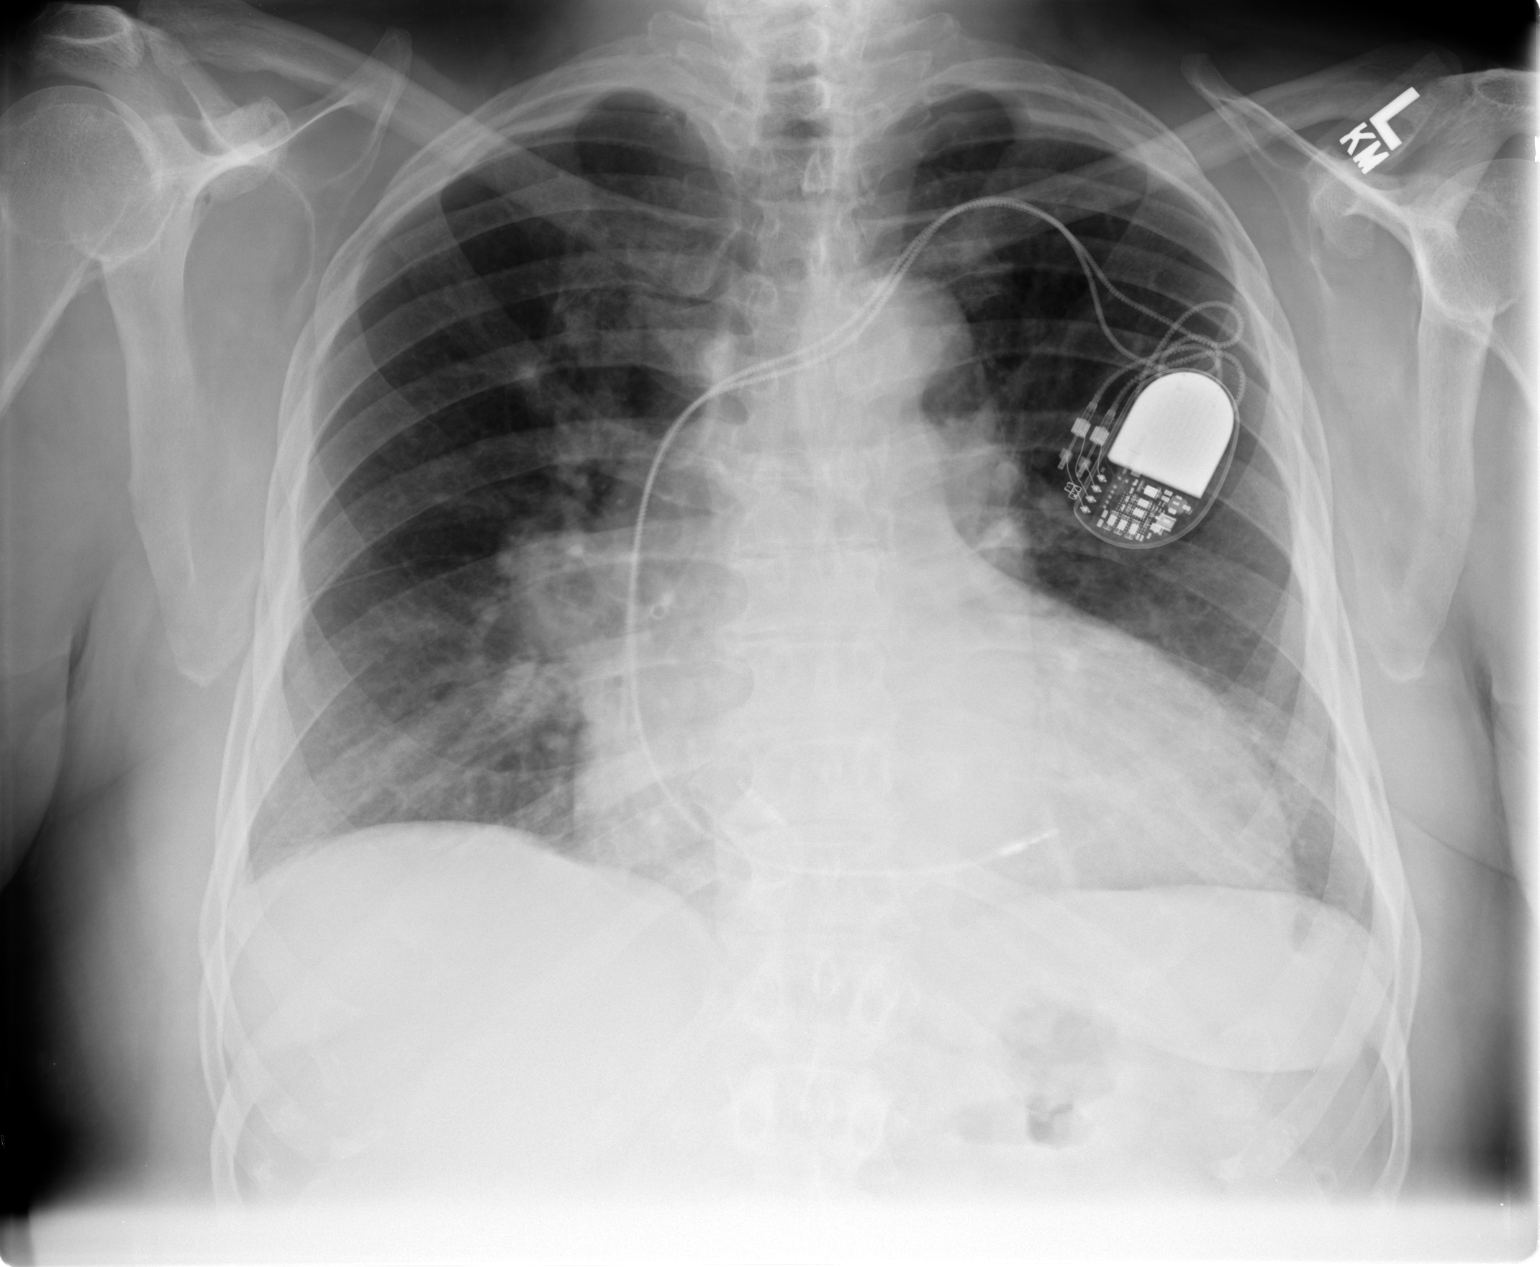

[view not recorded (2 of 2)]
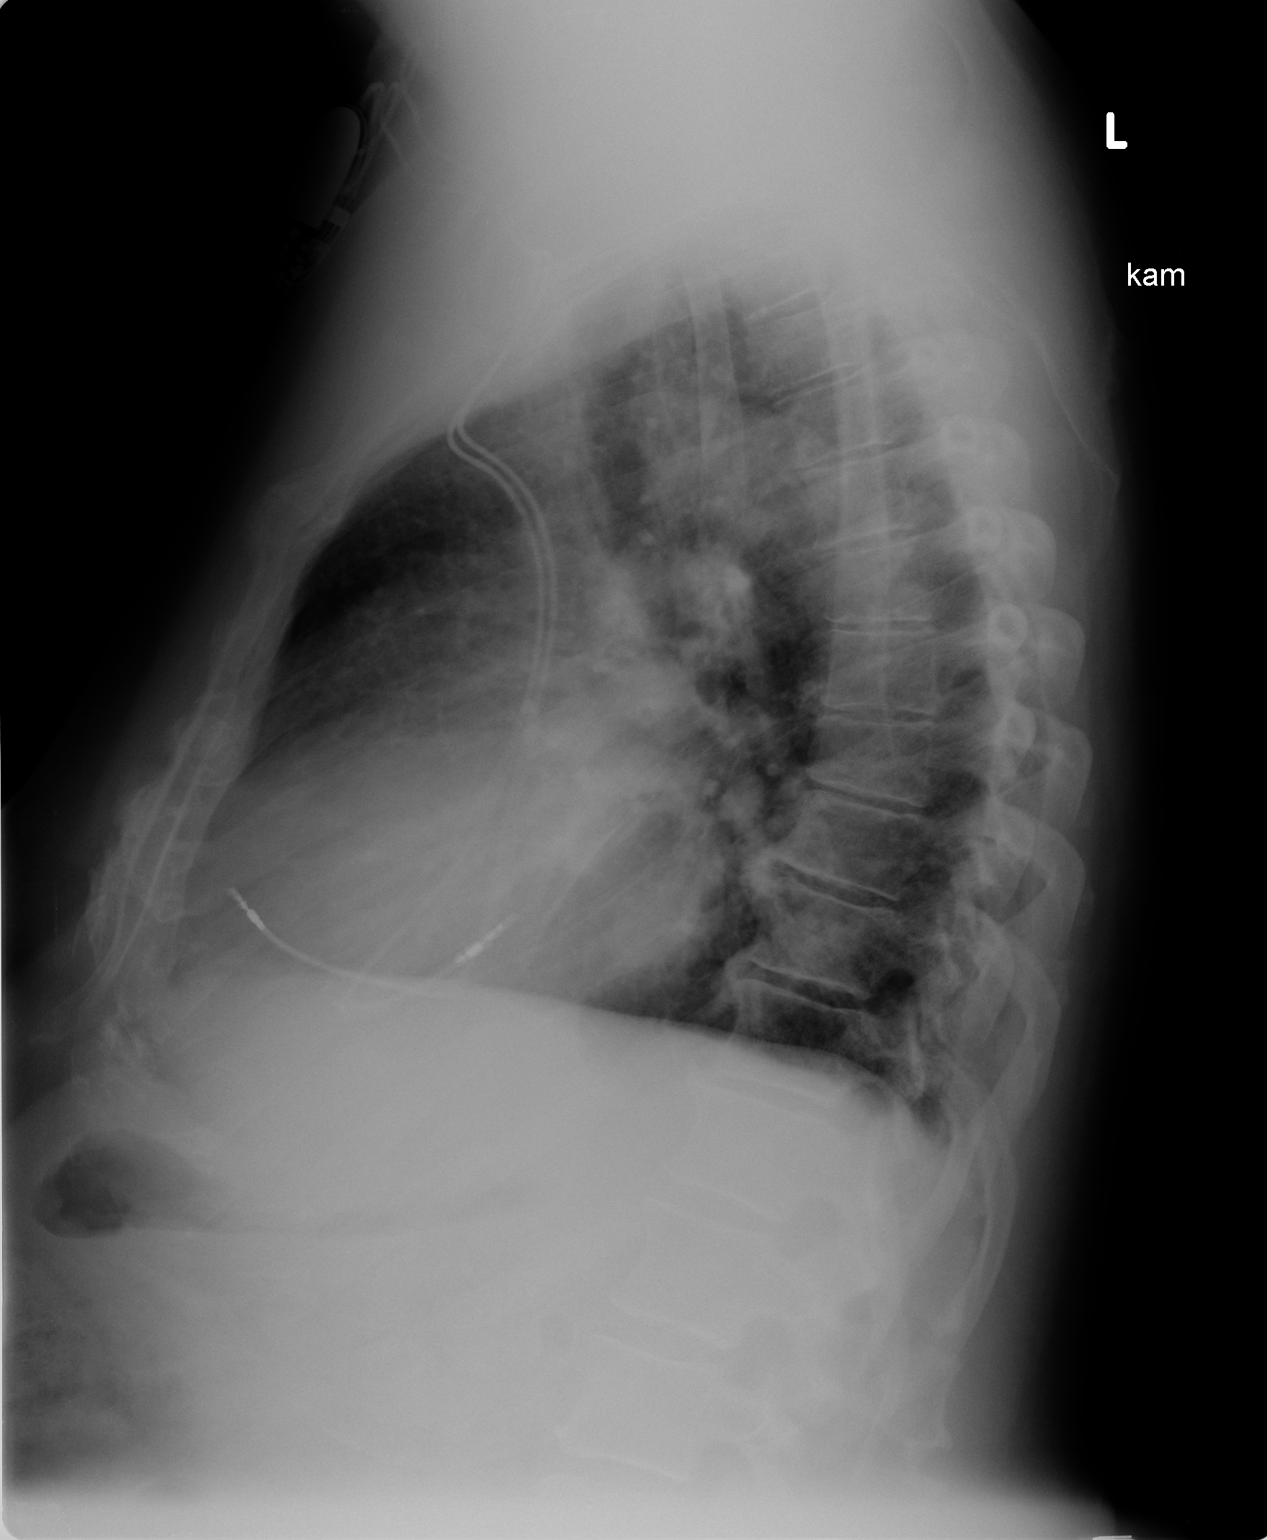

[2 of 2 positions shown; findings below may reference images not displayed]

FINDINGS: Cardiomegaly is present. There is no pulmonary venous distention.
Mild prominence of the central pulmonary vasculature about the hilum
is unchanged. Cardiac pacer with stable lead tip positioning is
present. Lungs are clear. No pleural effusion.
IMPRESSION: 1.  Stable cardiomegaly.  Cardiac pacer.  No CHF.

2.  No acute cardiopulmonary disease.

## 2015-09-12 ENCOUNTER — Telehealth: Payer: Self-pay | Admitting: Cardiology

## 2015-09-12 ENCOUNTER — Ambulatory Visit (INDEPENDENT_AMBULATORY_CARE_PROVIDER_SITE_OTHER): Payer: BLUE CROSS/BLUE SHIELD | Admitting: *Deleted

## 2015-09-12 DIAGNOSIS — I5022 Chronic systolic (congestive) heart failure: Secondary | ICD-10-CM | POA: Diagnosis not present

## 2015-09-12 DIAGNOSIS — I442 Atrioventricular block, complete: Secondary | ICD-10-CM | POA: Diagnosis not present

## 2015-09-12 NOTE — Telephone Encounter (Signed)
Spoke with pt and reminded pt of remote transmission that is due today. Pt verbalized understanding.   

## 2015-09-12 NOTE — Progress Notes (Signed)
Remote pacemaker transmission.   

## 2015-09-13 LAB — CUP PACEART REMOTE DEVICE CHECK
Battery Remaining Longevity: 84 mo
Brady Statistic AP VP Percent: 2.41 %
Brady Statistic AP VS Percent: 0 %
Brady Statistic AS VS Percent: 0.1 %
Brady Statistic RA Percent Paced: 2.41 %
Implantable Lead Implant Date: 20160609
Implantable Lead Implant Date: 20160609
Implantable Lead Location: 753858
Implantable Lead Location: 753859
Implantable Lead Location: 753860
Implantable Lead Model: 5076
Lead Channel Impedance Value: 456 Ohm
Lead Channel Impedance Value: 608 Ohm
Lead Channel Impedance Value: 646 Ohm
Lead Channel Impedance Value: 779 Ohm
Lead Channel Impedance Value: 855 Ohm
Lead Channel Impedance Value: 988 Ohm
Lead Channel Pacing Threshold Amplitude: 0.875 V
Lead Channel Pacing Threshold Amplitude: 0.875 V
Lead Channel Pacing Threshold Amplitude: 2.125 V
Lead Channel Pacing Threshold Pulse Width: 0.4 ms
Lead Channel Sensing Intrinsic Amplitude: 4.5 mV
Lead Channel Setting Pacing Amplitude: 2 V
Lead Channel Setting Pacing Amplitude: 2.5 V
Lead Channel Setting Pacing Amplitude: 2.75 V
Lead Channel Setting Pacing Pulse Width: 0.4 ms
Lead Channel Setting Pacing Pulse Width: 0.4 ms
MDC IDC LEAD IMPLANT DT: 20160609
MDC IDC MSMT BATTERY VOLTAGE: 3.02 V
MDC IDC MSMT LEADCHNL LV IMPEDANCE VALUE: 1235 Ohm
MDC IDC MSMT LEADCHNL LV PACING THRESHOLD PULSEWIDTH: 0.4 ms
MDC IDC MSMT LEADCHNL RA IMPEDANCE VALUE: 380 Ohm
MDC IDC MSMT LEADCHNL RA IMPEDANCE VALUE: 532 Ohm
MDC IDC MSMT LEADCHNL RA SENSING INTR AMPL: 4.5 mV
MDC IDC MSMT LEADCHNL RV PACING THRESHOLD PULSEWIDTH: 0.4 ms
MDC IDC MSMT LEADCHNL RV SENSING INTR AMPL: 15.125 mV
MDC IDC SESS DTM: 20170105005717
MDC IDC SET LEADCHNL RV SENSING SENSITIVITY: 8 mV
MDC IDC STAT BRADY AS VP PERCENT: 97.48 %
MDC IDC STAT BRADY RV PERCENT PACED: 99.9 %

## 2015-09-26 ENCOUNTER — Encounter: Payer: Self-pay | Admitting: Cardiology

## 2015-10-01 ENCOUNTER — Other Ambulatory Visit: Payer: Self-pay | Admitting: *Deleted

## 2015-10-01 MED ORDER — FUROSEMIDE 40 MG PO TABS: 40.0000 mg | ORAL_TABLET | Freq: Every day | ORAL | Status: DC | PRN

## 2015-12-12 ENCOUNTER — Encounter: Payer: BLUE CROSS/BLUE SHIELD | Admitting: *Deleted

## 2015-12-14 ENCOUNTER — Encounter: Payer: Self-pay | Admitting: Cardiology

## 2016-08-12 NOTE — Progress Notes (Deleted)
Electrophysiology Office Note Date: 08/12/2016  ID:  [name], [name] 29-Nov-1945, MRN [id number]  PCP: [name], MD Primary Cardiologist: Swaziland Electrophysiologist: Allred  CC: Pacemaker and heart failure follow-up  [name] is a 70 y.o. male seen today for Dr Swaziland.  He was found to have atrial and ventricular lead failure and underwent RA and RV lead revisions as well as upgrade to [facility] 02/2015.  Since last being seen in clinic, the patient reports doing very well.    He denies chest pain, palpitations, dyspnea, PND, orthopnea, nausea, vomiting, dizziness, syncope, edema, weight gain, or early satiety.  Device History: MDT dual chamber PPM implanted 2010 for CHB; RA and RV lead failure 2016 with replacement of RA and RV leads and upgrade to [facility] 02/2015   Past Medical History:  Diagnosis Date  . Chronic renal insufficiency    WITH BASELINE CREATININE OF 1.6  . Chronic systolic CHF (congestive heart failure) (HCC)   . Complete heart block (HCC)    s/p PPM by Dr Reyes Ivan  . Coronary disease    NONOBSTRUCTIVE  . GERD (gastroesophageal reflux disease)   . Gout   . Hyperkalemia   . Hypertension   . Nonischemic cardiomyopathy (HCC)   . Presence of permanent cardiac pacemaker    Past Surgical History:  Procedure Laterality Date  . CARDIAC CATHETERIZATION  08/2013  . EP IMPLANTABLE DEVICE N/A 02/15/2015   Procedure: Lead Revision/Repair;  Surgeon: Hillis Range, MD;  Location: [facility] INVASIVE CV LAB;  Service: Cardiovascular;  Laterality: N/A;  . INGUINAL HERNIA REPAIR Bilateral   . INSERT / REPLACE / REMOVE PACEMAKER  05/20/2009   MDT by Dr Reyes Ivan for complete heart block  . LEFT AND RIGHT HEART CATHETERIZATION WITH CORONARY ANGIOGRAM N/A 08/16/2013   Procedure: LEFT AND RIGHT HEART CATHETERIZATION WITH CORONARY ANGIOGRAM;  Surgeon: [name] Swaziland, MD;  Location: [facility] CATH LAB;  Service: Cardiovascular;  Laterality: N/A;    Current Outpatient Prescriptions    Medication Sig Dispense Refill  . amLODipine (NORVASC) 5 MG tablet Take 5 mg by mouth daily.    . Ascorbic Acid (VITAMIN C) 1000 MG tablet Take 2,000 mg by mouth daily.     Marland Kitchen aspirin 81 MG tablet Take 81 mg by mouth daily.      . carvedilol (COREG) 12.5 MG tablet Take 1 tablet (12.5 mg total) by mouth 2 (two) times daily. (Patient not taking: Reported on 06/13/2015) 180 tablet 3  . Cholecalciferol (VITAMIN D-3) 1000 UNITS CAPS Take 1,000 Units by mouth daily.    . Colchicine 0.6 MG CAPS Take 1 tablet by mouth daily as needed (gout).     . Fluticasone-Salmeterol (ADVAIR) 250-50 MCG/DOSE AEPB Inhale 1 puff into the lungs as needed (shortness of breath).     . furosemide (LASIX) 40 MG tablet Take 1 tablet (40 mg total) by mouth daily as needed (swelling). 30 tablet 11  . GARLIC PO Take 1 tablet by mouth daily.     . Glucosamine HCl (GLUCOSAMINE PO) Take 1 tablet by mouth daily.     . Hydrocodone-Acetaminophen 5-300 MG TABS TAKE 1 TABLET BY MOUTH EVERY 6 TO 8 HOURS AS NEEDED FOR PAIN  0   No current facility-administered medications for this visit.     Allergies:   Avalide [irbesartan-hydrochlorothiazide]; Penicillins; and Tinactin [tolnaftate]   Social History: Social History   Social History  . Marital status: Divorced    Spouse name: N/A  . Number of children: 1  .  Years of education: 5714   Occupational History  . Occupational psychologistMECHANIC [employer]   Social History Main Topics  . Smoking status: Former Smoker    Packs/day: 1.00    Years: 15.00    Types: Cigarettes  . Smokeless tobacco: Never Used     Comment: "quit smoking cigarettes in 1985"  . Alcohol use 4.2 oz/week    7 Shots of liquor per week  . Drug use: No  . Sexual activity: Not Currently   Other Topics Concern  . Not on file   Social History Narrative   [place]   Drinks a couple of coffees a day   Lives at home alone with his dog    Family History: Family History  Problem Relation Age of Onset  .  Diabetes Father      Review of Systems: All other systems reviewed and are otherwise negative except as noted above.   Physical Exam: VS:  There were no vitals taken for this visit. , BMI There is no height or weight on file to calculate BMI.  GEN- The patient is obese appearing, alert and oriented x 3 today.   HEENT: normocephalic, atraumatic; sclera clear, conjunctiva pink; hearing intact; oropharynx clear; neck supple Lungs- Clear to ausculation bilaterally, normal work of breathing.  No wheezes, rales, rhonchi Heart- Regular rate and rhythm (paced) GI- soft, non-tender, non-distended, bowel sounds present  Extremities- no clubbing, cyanosis, or edema; DP/PT/radial pulses 2+ bilaterally MS- no significant deformity or atrophy Skin- warm and dry, no rash or lesion; PPM pocket well healed Psych- euthymic mood, full affect Neuro- strength and sensation are intact  PPM Interrogation- reviewed in detail today,  See PACEART report  EKG:  EKG is ordered today. EKG today shows ***  Recent Labs: No results found for requested labs within last 8760 hours.   Wt Readings from Last 3 Encounters:  06/13/15 235 lb 12.8 oz (107 kg)  05/11/15 236 lb 9.6 oz (107.3 kg)  04/25/15 233 lb 12.8 oz (106.1 kg)     Other studies Reviewed: Additional studies/ records that were reviewed today include:Dr Allred's office notes, Dr Elvis CoilJordan's office notes  Assessment and Plan:  1.  Complete heart block Normal PPM function - pt is dependent today See Pace Art report No changes today  2.  Chronic systolic heart failure Euvolemic on exam Continue medical therapy Will update echo post CRT upgrade [facility]  BM[facility]    Current medicines are reviewed at length with the patient today.   The patient does not have concerns regarding his medicines.  The following changes were made today:  none  Labs/ tests ordered today include: BMET, echo    Disposition:   Follow up with [facility]  clinic, [facility], Dr SwazilandJordan 6 months, Dr Johney FrameAllred 1 year   Signed, Gypsy BalsamAmber Colsen Modi, NP 08/12/2016 10:34 AM  [facility] [address] [place] [address] [phone] (office) [phone] (fax)

## 2016-08-13 ENCOUNTER — Encounter: Payer: BLUE CROSS/BLUE SHIELD | Admitting: Nurse Practitioner

## 2016-09-02 NOTE — Progress Notes (Signed)
Cardiology Office Note Date:  09/03/2016  Patient ID:  [name], [name] 08/06/1946, MRN [id number] PCP:  [name], MD  Cardiologist:  Dr. Swaziland Electrophysiologist: Dr. Johney Frame   Chief Complaint: routine in-clinic EP/device visit  History of Present Illness: [name] is a 70 y.o. male with history of CHB w/ CRT-P, CRI (stage III) follows with nephrology Dr. Hyman Hopes, NICM, chronic CHF comes in today to be seen for Dr. Johney Frame, last seen by him in 06/2015, doing well at that time. Record reviewed: November 2014 he presented with worsening CHF.  Echocardiogram showed marked LV dysfunction with ejection fraction of 20-25%. He had moderate pulmonary HTN. He was started on nitrates and hydralazine. He underwent right and left heart cath on 08/16/13 with moderate pulmonary HTN and elevated filling pressures. 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   He declined ICD. In June 2016 he was found to have lead fractures of atrial and ventricular leads and underwent revision of pacemaker leads and upgrade to CRT device.   He is feeling "great", denies any kind of CP, no palpitations, SOB, no exertional intolerances.  No dizziness, near syncope or syncope.  He is planning a trip in the next few weeks to [place], considering staying there permanently.  He reports having labs done with his PMD as well as his nephrologist and states that he was told his kidney function has markedly improved.   Device information: MDT CRT-P implanted 02/15/15, Dr. Johney Frame (RA/RV lead failure replaced and LV lead added 02/15/15, original dual chamber device implanted 2010)    Past Medical History:  Diagnosis Date  . Chronic renal insufficiency    WITH BASELINE CREATININE OF 1.6  . Chronic systolic CHF (congestive heart failure) (HCC)   . Complete heart block (HCC)    s/p PPM by Dr Reyes Ivan  . Coronary disease    NONOBSTRUCTIVE  . GERD (gastroesophageal reflux disease)   . Gout   . Hyperkalemia   .  Hypertension   . Nonischemic cardiomyopathy (HCC)   . Presence of permanent cardiac pacemaker     Past Surgical History:  Procedure Laterality Date  . CARDIAC CATHETERIZATION  08/2013  . EP IMPLANTABLE DEVICE N/A 02/15/2015   Procedure: Lead Revision/Repair;  Surgeon: Hillis Range, MD;  [facility]: [facility] INVASIVE CV LAB;  Service: Cardiovascular;  Laterality: N/A;  . INGUINAL HERNIA REPAIR Bilateral   . INSERT / REPLACE / REMOVE PACEMAKER  05/20/2009   MDT by Dr Reyes Ivan for complete heart block  . LEFT AND RIGHT HEART CATHETERIZATION WITH CORONARY ANGIOGRAM N/A 08/16/2013   Procedure: LEFT AND RIGHT HEART CATHETERIZATION WITH CORONARY ANGIOGRAM;  Surgeon: [name] Swaziland, MD;  Location: [facility] CATH LAB;  Service: Cardiovascular;  Laterality: N/A;    Current Outpatient Prescriptions  Medication Sig Dispense Refill  . allopurinol (ZYLOPRIM) 100 MG tablet Take 100 mg by mouth daily.    Marland Kitchen amLODipine (NORVASC) 5 MG tablet Take 5 mg by mouth daily.    . Ascorbic Acid (VITAMIN C) 1000 MG tablet Take 2,000 mg by mouth daily.     Marland Kitchen aspirin 81 MG tablet Take 81 mg by mouth daily.      . carvedilol (COREG) 12.5 MG tablet Take 1 tablet (12.5 mg total) by mouth 2 (two) times daily. 180 tablet 3  . Cholecalciferol (VITAMIN D-3) 1000 UNITS CAPS Take 1,000 Units by mouth daily.    . Colchicine 0.6 MG CAPS Take 1 tablet by mouth daily as needed (  gout).     . Fluticasone-Salmeterol (ADVAIR) 250-50 MCG/DOSE AEPB Inhale 1 puff into the lungs as needed (shortness of breath).     . furosemide (LASIX) 40 MG tablet Take 1 tablet (40 mg total) by mouth daily as needed (swelling). 30 tablet 11  . GARLIC PO Take 1 tablet by mouth daily.     . Glucosamine HCl (GLUCOSAMINE PO) Take 1 tablet by mouth daily.      No current facility-administered medications for this visit.     Allergies:   Avalide [irbesartan-hydrochlorothiazide]; Penicillins; and Tinactin [tolnaftate]   Social History:  The patient  reports that he has quit  smoking. His smoking use included Cigarettes. He has a 15.00 pack-year smoking history. He has never used smokeless tobacco. He reports that he drinks about 4.2 oz of alcohol per week . He reports that he does not use drugs.   Family History:  The patient's family history includes Diabetes in his father.  ROS:  Please see the history of present illness.  All other systems are reviewed and otherwise negative.   PHYSICAL EXAM:  VS:  BP (!) 144/84   Pulse 85   Ht 5\' 9"  (1.753 m)   Wt 245 lb (111.1 kg)   BMI 36.18 kg/m  BMI: Body mass index is 36.18 kg/m. Well nourished, well developed, in no acute distress  HEENT: normocephalic, atraumatic  Neck: no JVD, carotid bruits or masses Cardiac:  RRR; no significant murmurs, no rubs, or gallops Lungs:  clear to auscultation bilaterally, no wheezing, rhonchi or rales  Abd: soft, nontender MS: no deformity, age appropriate atrophy Ext: trace edema  Skin: warm and dry, no rash Neuro:  No gross deficits appreciated Psych: euthymic mood, full affect  PPM site is stable, no tethering or discomfort   EKG:  Done today and reviewed by myself shows SR, V paced, unchanged PPM interrogation done today and reviewed by myself: stable lead and battery status, 3 AT/AF episodes are Atach by EGM, 1, 6 beat NSVT  02/15/15: TTE Study Conclusions - Left ventricle: The cavity size was normal. There was moderate   concentric hypertrophy. Systolic function was severely reduced.   The estimated ejection fraction was in the range of 25% to 30%.   Wall motion was normal; there were no regional wall motion   abnormalities. The study is not technically sufficient to allow   evaluation of LV diastolic function. - Ventricular septum: Septal motion showed paradox. - Aortic valve: Trileaflet; moderately thickened, moderately   calcified leaflets. Sclerosis without stenosis. There was no   regurgitation. - Aortic root: The aortic root was normal in size. - Mitral  valve: There was mild regurgitation. - Right ventricle: Right ventricle is poorly visualized but appears   to be have at least midly impaired systolic function. Pacer wire   or catheter noted in right ventricle. - Right atrium: Pacer wire or catheter noted in right atrium. - Pulmonary arteries: Systolic pressure was within the normal   range. - Inferior vena cava: The vessel was normal in size. - Pericardium, extracardiac: There was no pericardial effusion.  Recent Labs: No results found for requested labs within last 8760 hours.  No results found for requested labs within last 8760 hours.   CrCl cannot be calculated (Patient's most recent lab result is older than the maximum 21 days allowed.).   Wt Readings from Last 3 Encounters:  09/03/16 245 lb (111.1 kg)  06/13/15 235 lb 12.8 oz (107 kg)  05/11/15 236 lb  9.6 oz (107.3 kg)     Other studies reviewed: Additional studies/records reviewed today include: summarized above  ASSESSMENT AND PLAN:  1. CHB, CRT-P      normal device function, no changes made  2. Chronic CHF, NICM      Felt to be secondary to chronic RV pacing      Patient declined ICD again at the time of his new implant, agreeable to CRT-P only     He has trace edema, weight is op, Optivol is below threshold, no symptoms of fluid OL, he admits to increased dietary intake, mentions consideration of appetite suppressants but cautioned and advised to avoid use, and start regular exercise and dietary management     On BB, no ACE/ARB given renal disease and hx of hyperkalemia, noting ananaphylaxis with avalide as well     C/w Dr. Swaziland     Discussed repeat echo though he hesitates, states that he feels great and would like to hold off for now  3. HTN     Stable with current regime   Disposition: He declines remote monitoring, agreeable to q 6 month device clinic checks, will plan 1 year with Dr. Johney Frame.  Should he relocate to [place] will let us know, and arrange  medical care and management there, he is familiar and states likely with [facility] at [facility].  Current medicines are reviewed at length with the patient today.  The patient did not have any concerns regarding medicines.  Judith Blonder, PA-C 09/03/2016 2:48 PM     [facility] [address] [place] [address] [phone] (office)  [phone] (fax)

## 2016-09-03 ENCOUNTER — Encounter (INDEPENDENT_AMBULATORY_CARE_PROVIDER_SITE_OTHER): Payer: Self-pay

## 2016-09-03 ENCOUNTER — Encounter: Payer: Self-pay | Admitting: Physician Assistant

## 2016-09-03 ENCOUNTER — Ambulatory Visit (INDEPENDENT_AMBULATORY_CARE_PROVIDER_SITE_OTHER): Payer: BLUE CROSS/BLUE SHIELD | Admitting: Physician Assistant

## 2016-09-03 VITALS — BP 144/84 | HR 85 | Ht 69.0 in | Wt 245.0 lb

## 2016-09-03 DIAGNOSIS — I10 Essential (primary) hypertension: Secondary | ICD-10-CM

## 2016-09-03 DIAGNOSIS — I442 Atrioventricular block, complete: Secondary | ICD-10-CM | POA: Diagnosis not present

## 2016-09-03 DIAGNOSIS — I420 Dilated cardiomyopathy: Secondary | ICD-10-CM

## 2016-09-03 DIAGNOSIS — I5022 Chronic systolic (congestive) heart failure: Secondary | ICD-10-CM | POA: Diagnosis not present

## 2016-09-03 DIAGNOSIS — Z950 Presence of cardiac pacemaker: Secondary | ICD-10-CM

## 2016-09-03 LAB — CUP PACEART INCLINIC DEVICE CHECK
Battery Remaining Longevity: 66 mo
Battery Voltage: 3.01 V
Brady Statistic AP VP Percent: 1.32 %
Brady Statistic AP VS Percent: 0 %
Brady Statistic AS VP Percent: 98.57 %
Brady Statistic AS VS Percent: 0.11 %
Brady Statistic RA Percent Paced: 1.32 %
Brady Statistic RV Percent Paced: 99.84 %
Date Time Interrogation Session: 20171227163455
Implantable Lead Implant Date: 20160609
Implantable Lead Implant Date: 20160609
Implantable Lead Implant Date: 20160609
Implantable Lead Location: 753858
Implantable Lead Location: 753859
Implantable Lead Location: 753860
Implantable Lead Model: 5076
Implantable Lead Model: 5076
Implantable Pulse Generator Implant Date: 20160609
Lead Channel Impedance Value: 1045 Ohm
Lead Channel Impedance Value: 361 Ohm
Lead Channel Impedance Value: 456 Ohm
Lead Channel Impedance Value: 494 Ohm
Lead Channel Impedance Value: 570 Ohm
Lead Channel Impedance Value: 608 Ohm
Lead Channel Impedance Value: 646 Ohm
Lead Channel Impedance Value: 798 Ohm
Lead Channel Impedance Value: 817 Ohm
Lead Channel Pacing Threshold Amplitude: 0.875 V
Lead Channel Pacing Threshold Amplitude: 1.125 V
Lead Channel Pacing Threshold Amplitude: 2.125 V
Lead Channel Pacing Threshold Pulse Width: 0.4 ms
Lead Channel Pacing Threshold Pulse Width: 0.4 ms
Lead Channel Pacing Threshold Pulse Width: 0.4 ms
Lead Channel Sensing Intrinsic Amplitude: 15.125 mV
Lead Channel Sensing Intrinsic Amplitude: 3.125 mV
Lead Channel Sensing Intrinsic Amplitude: 3.625 mV
Lead Channel Setting Pacing Amplitude: 2.25 V
Lead Channel Setting Pacing Amplitude: 2.5 V
Lead Channel Setting Pacing Amplitude: 2.75 V
Lead Channel Setting Pacing Pulse Width: 0.4 ms
Lead Channel Setting Pacing Pulse Width: 0.4 ms
Lead Channel Setting Sensing Sensitivity: 8 mV

## 2016-09-03 NOTE — Patient Instructions (Signed)
Medication Instructions:   Your physician recommends that you continue on your current medications as directed. Please refer to the Current Medication list given to you today.  If you need a refill on your cardiac medications before your next appointment, please call your pharmacy.  Labwork: NONE ORDERED  TODAY    Testing/Procedures: NONE ORDERED  TODAY    Follow-Up: 6 MONTHS WITH DEVICE CLINIC   Your physician wants you to follow-up in: ONE YEAR WITH ALLRED  You will receive a reminder letter in the mail two months in advance. If you don't receive a letter, please call our office to schedule the follow-up appointment.     Any Other Special Instructions Will Be Listed Below (If Applicable).

## 2016-10-03 ENCOUNTER — Other Ambulatory Visit: Payer: Self-pay | Admitting: Cardiology

## 2016-12-03 ENCOUNTER — Telehealth: Payer: Self-pay | Admitting: Cardiology

## 2016-12-03 ENCOUNTER — Encounter: Payer: BLUE CROSS/BLUE SHIELD | Admitting: *Deleted

## 2016-12-03 NOTE — Telephone Encounter (Signed)
Spoke with pt and reminded pt of remote transmission that is due today. Pt verbalized understanding.   

## 2016-12-05 ENCOUNTER — Encounter: Payer: Self-pay | Admitting: Cardiology

## 2016-12-17 ENCOUNTER — Telehealth: Payer: Self-pay | Admitting: Internal Medicine

## 2016-12-17 NOTE — Telephone Encounter (Signed)
Patient reports that he had a remote check appointment with Jacki Cones but was out of town. He wanted to re-schedule. I told him that I would send a message to Jacki Cones as she was out of town and we would be in touch regarding a follow up. He was agreeable to this plan.

## 2016-12-17 NOTE — Telephone Encounter (Signed)
New Message    Pt calling regarding his pacemaker.. Requesting call back

## 2016-12-22 NOTE — Telephone Encounter (Signed)
Message sent to [facility], device clinic for follow up since she had previously spoken to patient.

## 2016-12-23 NOTE — Telephone Encounter (Signed)
LMOVM for pt to return call 

## 2016-12-25 NOTE — Telephone Encounter (Signed)
Spoke w/ pt and he agreed to send remote transmission on 12-30-16. Appointment scheduled.

## 2016-12-30 ENCOUNTER — Ambulatory Visit (INDEPENDENT_AMBULATORY_CARE_PROVIDER_SITE_OTHER): Payer: BLUE CROSS/BLUE SHIELD | Admitting: *Deleted

## 2016-12-30 DIAGNOSIS — I5022 Chronic systolic (congestive) heart failure: Secondary | ICD-10-CM | POA: Diagnosis not present

## 2016-12-30 DIAGNOSIS — I442 Atrioventricular block, complete: Secondary | ICD-10-CM

## 2016-12-31 NOTE — Progress Notes (Signed)
Remote pacemaker transmission.   

## 2017-01-01 LAB — CUP PACEART REMOTE DEVICE CHECK
Battery Voltage: 3.01 V
Brady Statistic AS VP Percent: 98.88 %
Brady Statistic RA Percent Paced: 0.85 %
Brady Statistic RV Percent Paced: 99.63 %
Date Time Interrogation Session: 20180425043437
Implantable Lead Implant Date: 20160609
Implantable Lead Location: 753860
Implantable Lead Model: 5076
Implantable Lead Model: 5076
Lead Channel Impedance Value: 1102 Ohm
Lead Channel Impedance Value: 361 Ohm
Lead Channel Impedance Value: 437 Ohm
Lead Channel Impedance Value: 494 Ohm
Lead Channel Impedance Value: 627 Ohm
Lead Channel Impedance Value: 798 Ohm
Lead Channel Impedance Value: 855 Ohm
Lead Channel Pacing Threshold Amplitude: 1.125 V
Lead Channel Pacing Threshold Pulse Width: 0.4 ms
Lead Channel Pacing Threshold Pulse Width: 0.4 ms
Lead Channel Pacing Threshold Pulse Width: 0.4 ms
Lead Channel Sensing Intrinsic Amplitude: 3.625 mV
Lead Channel Sensing Intrinsic Amplitude: 3.625 mV
Lead Channel Setting Pacing Amplitude: 2.5 V
Lead Channel Setting Pacing Pulse Width: 0.4 ms
MDC IDC LEAD IMPLANT DT: 20160609
MDC IDC LEAD IMPLANT DT: 20160609
MDC IDC LEAD LOCATION: 753858
MDC IDC LEAD LOCATION: 753859
MDC IDC MSMT BATTERY REMAINING LONGEVITY: 63 mo
MDC IDC MSMT LEADCHNL LV IMPEDANCE VALUE: 684 Ohm
MDC IDC MSMT LEADCHNL LV PACING THRESHOLD AMPLITUDE: 2.125 V
MDC IDC MSMT LEADCHNL RV IMPEDANCE VALUE: 551 Ohm
MDC IDC MSMT LEADCHNL RV PACING THRESHOLD AMPLITUDE: 0.875 V
MDC IDC MSMT LEADCHNL RV SENSING INTR AMPL: 15.125 mV
MDC IDC PG IMPLANT DT: 20160609
MDC IDC SET LEADCHNL LV PACING AMPLITUDE: 2.75 V
MDC IDC SET LEADCHNL RA PACING AMPLITUDE: 2.25 V
MDC IDC SET LEADCHNL RV PACING PULSEWIDTH: 0.4 ms
MDC IDC SET LEADCHNL RV SENSING SENSITIVITY: 8 mV
MDC IDC STAT BRADY AP VP PERCENT: 0.85 %
MDC IDC STAT BRADY AP VS PERCENT: 0 %
MDC IDC STAT BRADY AS VS PERCENT: 0.28 %

## 2017-01-02 ENCOUNTER — Encounter: Payer: Self-pay | Admitting: Cardiology

## 2017-01-16 IMAGING — CR DG CHEST 2V
2 series · 2 of 2 positions shown · non-contrast
Comparison: 02/14/2015

CLINICAL DATA: Status post new pacemaker placement. Complete heart
block. Chronic systolic congestive heart failure. Left chest and arm
pain.

EXAM:
CHEST  2 VIEW

[w chest pa]
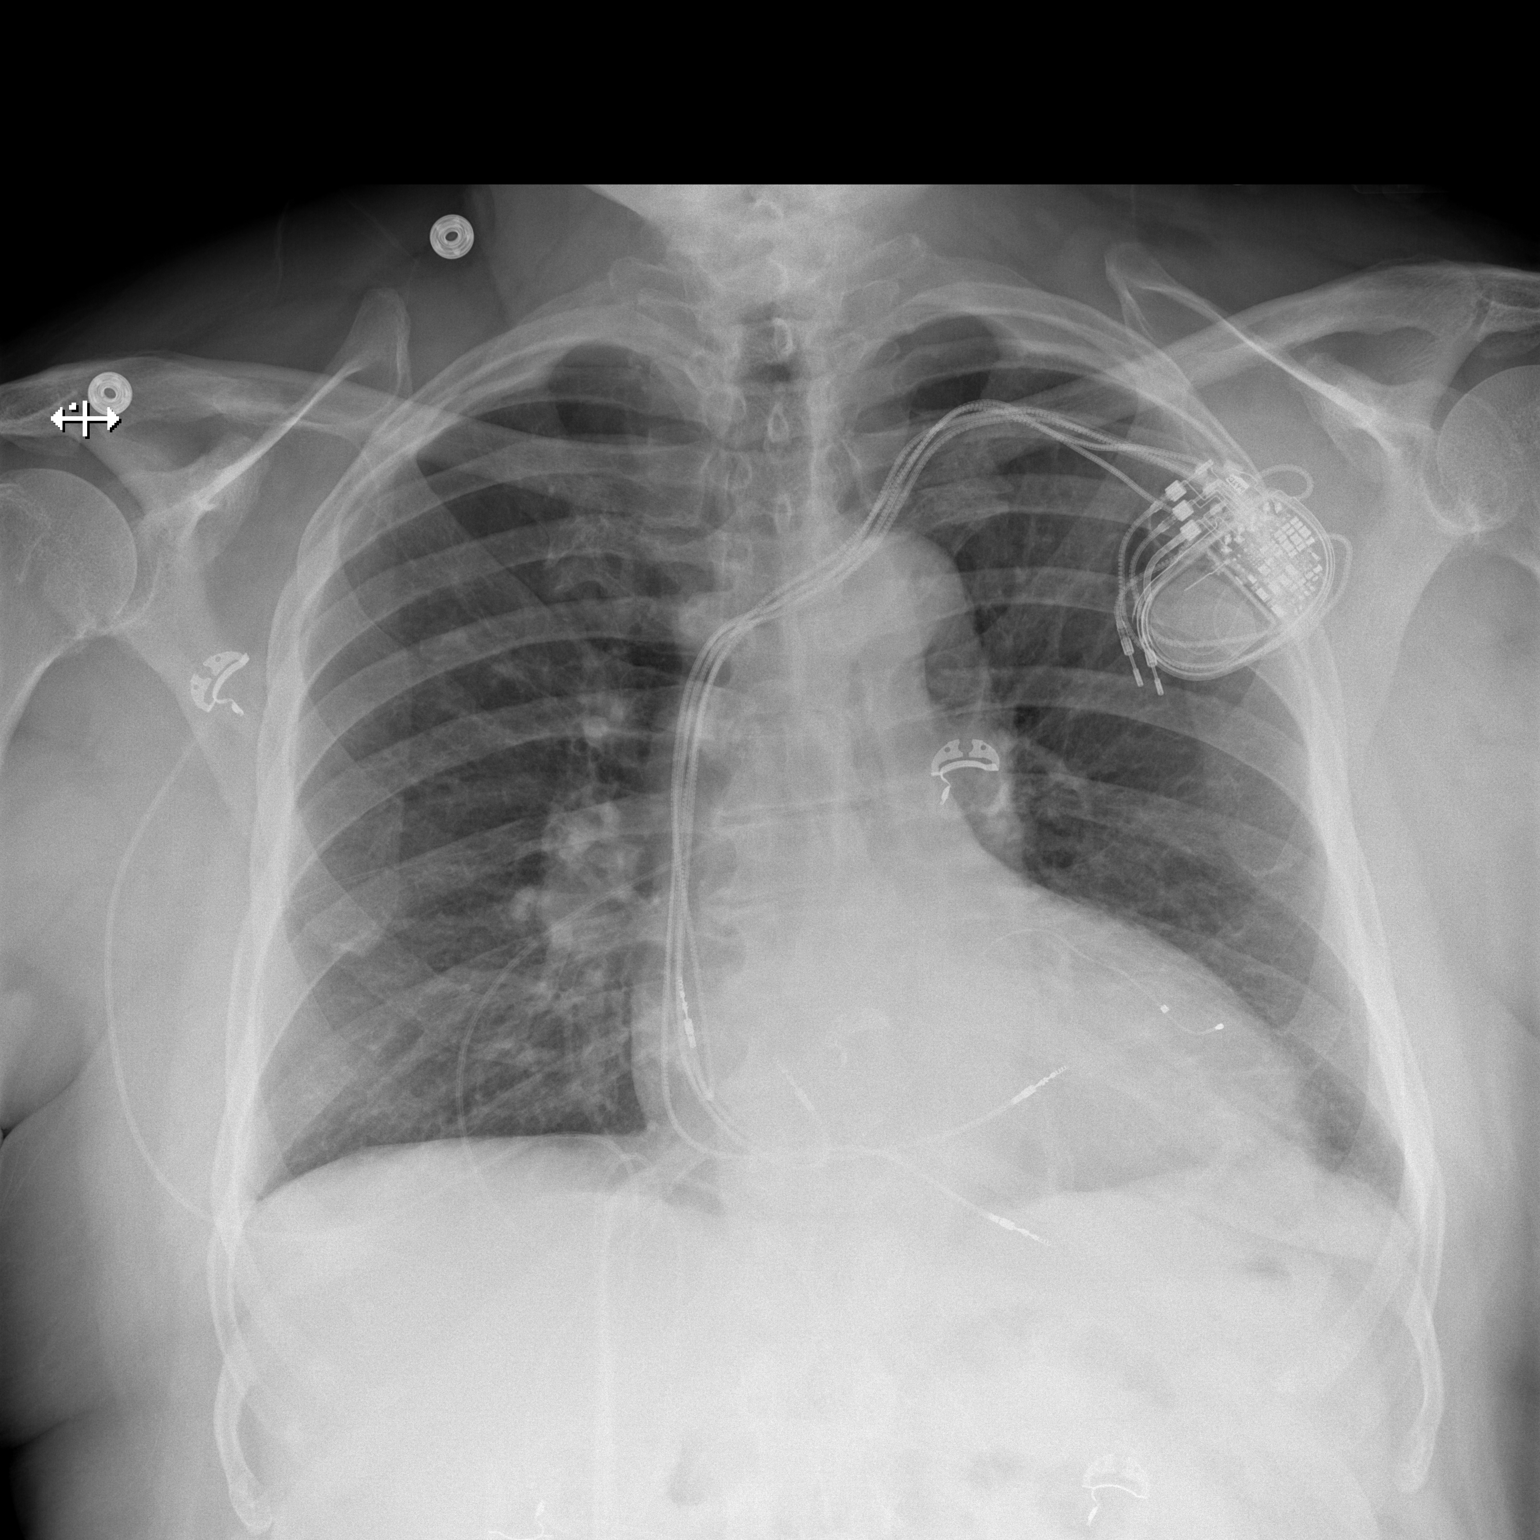

[w chest lat]
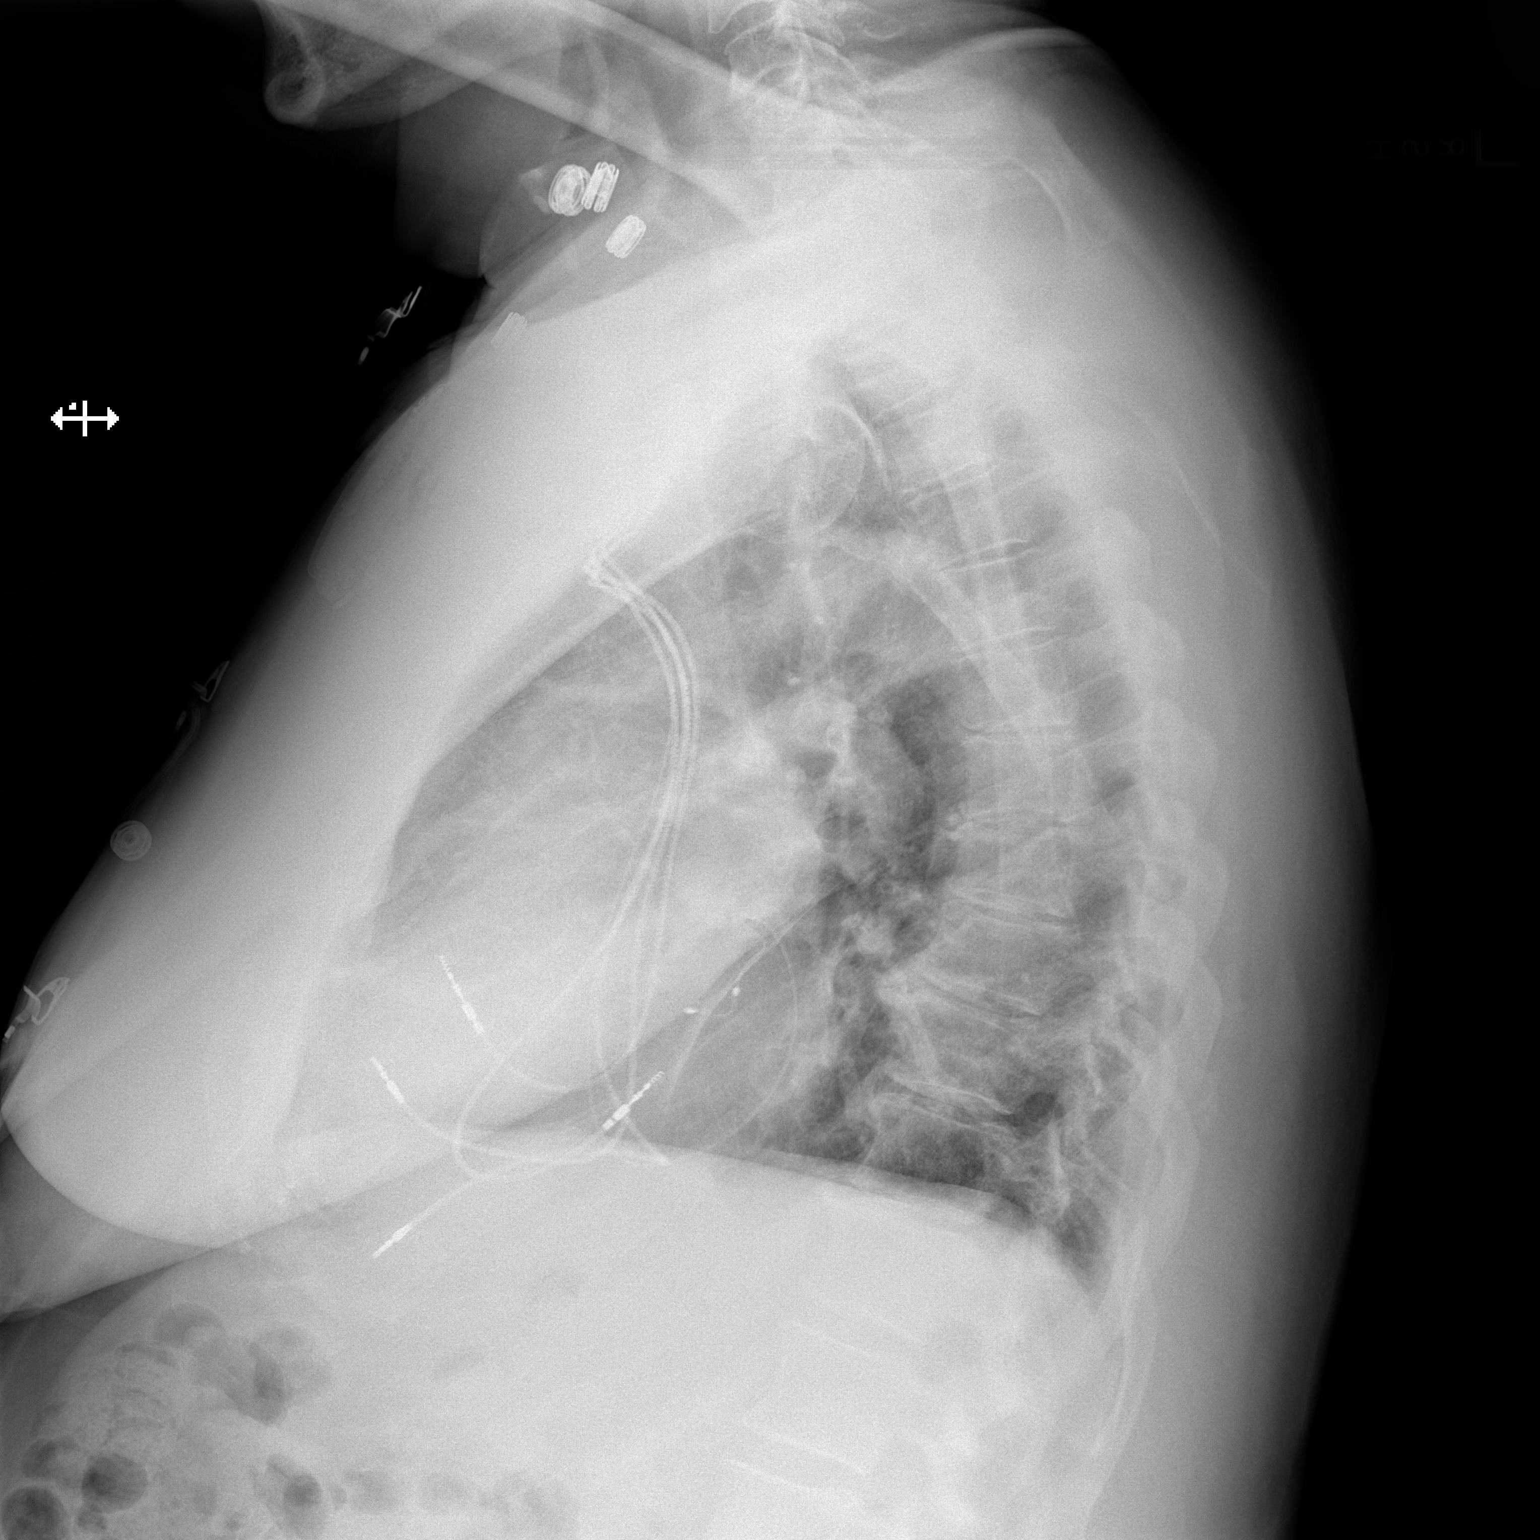

[2 of 2 positions shown; findings below may reference images not displayed]

FINDINGS: A new triple lead transvenous pacemaker seen in expected position.
No evidence of pneumothorax. Mild atelectasis noted in left lung
base. Right lung is clear. Borderline cardiomegaly remains stable.
IMPRESSION: New pacemaker in appropriate position. No evidence of pneumothorax.
Mild left basilar atelectasis.

## 2017-03-31 ENCOUNTER — Ambulatory Visit (INDEPENDENT_AMBULATORY_CARE_PROVIDER_SITE_OTHER): Payer: BLUE CROSS/BLUE SHIELD | Admitting: *Deleted

## 2017-03-31 ENCOUNTER — Telehealth: Payer: Self-pay | Admitting: Cardiology

## 2017-03-31 DIAGNOSIS — I442 Atrioventricular block, complete: Secondary | ICD-10-CM

## 2017-03-31 NOTE — Telephone Encounter (Signed)
Spoke with pt and reminded pt of remote transmission that is due today. Pt verbalized understanding.   

## 2017-04-01 NOTE — Progress Notes (Signed)
Remote pacemaker transmission.   

## 2017-04-02 ENCOUNTER — Encounter: Payer: Self-pay | Admitting: Cardiology

## 2017-04-28 LAB — CUP PACEART REMOTE DEVICE CHECK
Battery Remaining Longevity: 62 mo
Battery Voltage: 3.01 V
Brady Statistic AP VS Percent: 0 %
Brady Statistic AS VS Percent: 0.12 %
Brady Statistic RA Percent Paced: 1.43 %
Brady Statistic RV Percent Paced: 99.83 %
Date Time Interrogation Session: 20180725063136
Implantable Lead Implant Date: 20160609
Implantable Lead Implant Date: 20160609
Implantable Lead Location: 753858
Implantable Lead Location: 753860
Implantable Lead Model: 5076
Implantable Lead Model: 5076
Implantable Pulse Generator Implant Date: 20160609
Lead Channel Impedance Value: 361 Ohm
Lead Channel Impedance Value: 532 Ohm
Lead Channel Impedance Value: 589 Ohm
Lead Channel Pacing Threshold Amplitude: 1.125 V
Lead Channel Pacing Threshold Amplitude: 2.125 V
Lead Channel Pacing Threshold Pulse Width: 0.4 ms
Lead Channel Pacing Threshold Pulse Width: 0.4 ms
Lead Channel Sensing Intrinsic Amplitude: 3.875 mV
Lead Channel Sensing Intrinsic Amplitude: 3.875 mV
Lead Channel Setting Pacing Amplitude: 2.5 V
Lead Channel Setting Pacing Pulse Width: 0.4 ms
Lead Channel Setting Sensing Sensitivity: 8 mV
MDC IDC LEAD IMPLANT DT: 20160609
MDC IDC LEAD LOCATION: 753859
MDC IDC MSMT LEADCHNL LV IMPEDANCE VALUE: 1026 Ohm
MDC IDC MSMT LEADCHNL LV IMPEDANCE VALUE: 646 Ohm
MDC IDC MSMT LEADCHNL LV IMPEDANCE VALUE: 760 Ohm
MDC IDC MSMT LEADCHNL LV IMPEDANCE VALUE: 779 Ohm
MDC IDC MSMT LEADCHNL RA IMPEDANCE VALUE: 494 Ohm
MDC IDC MSMT LEADCHNL RV IMPEDANCE VALUE: 437 Ohm
MDC IDC MSMT LEADCHNL RV PACING THRESHOLD AMPLITUDE: 0.75 V
MDC IDC MSMT LEADCHNL RV PACING THRESHOLD PULSEWIDTH: 0.4 ms
MDC IDC MSMT LEADCHNL RV SENSING INTR AMPL: 15.125 mV
MDC IDC SET LEADCHNL LV PACING AMPLITUDE: 2.75 V
MDC IDC SET LEADCHNL LV PACING PULSEWIDTH: 0.4 ms
MDC IDC SET LEADCHNL RA PACING AMPLITUDE: 2.5 V
MDC IDC STAT BRADY AP VP PERCENT: 1.43 %
MDC IDC STAT BRADY AS VP PERCENT: 98.46 %

## 2017-06-16 ENCOUNTER — Other Ambulatory Visit (HOSPITAL_COMMUNITY): Payer: Self-pay | Admitting: Orthopedic Surgery

## 2017-06-16 ENCOUNTER — Ambulatory Visit (HOSPITAL_COMMUNITY)
Admission: RE | Admit: 2017-06-16 | Discharge: 2017-06-16 | Disposition: A | Discharge status: Home / Self Care | Payer: BLUE CROSS/BLUE SHIELD | Source: Ambulatory Visit | Attending: Internal Medicine | Admitting: Internal Medicine

## 2017-06-16 DIAGNOSIS — M7989 Other specified soft tissue disorders: Secondary | ICD-10-CM

## 2017-06-16 DIAGNOSIS — M79604 Pain in right leg: Secondary | ICD-10-CM | POA: Diagnosis not present

## 2017-06-16 NOTE — Progress Notes (Signed)
**  Preliminary report by tech**  Right lower extremity venous duplex complete. There is no evidence of deep or superficial vein thrombosis involving the right lower extremity. All visualized vessels appear patent and compressible. There is no evidence of a Baker's cyst on the right. Results were faxed to Dr. Allyne Gee.  06/16/17 2:31 PM Olen Cordial RVT

## 2017-06-30 ENCOUNTER — Encounter: Payer: BLUE CROSS/BLUE SHIELD | Admitting: *Deleted

## 2017-07-02 ENCOUNTER — Encounter: Payer: Self-pay | Admitting: Cardiology

## 2017-07-10 ENCOUNTER — Emergency Department (HOSPITAL_COMMUNITY): Payer: BLUE CROSS/BLUE SHIELD

## 2017-07-10 ENCOUNTER — Emergency Department (HOSPITAL_COMMUNITY)
Admission: EM | Admit: 2017-07-10 | Discharge: 2017-07-10 | Disposition: A | Discharge status: Home / Self Care | Payer: BLUE CROSS/BLUE SHIELD | Attending: Emergency Medicine | Admitting: Emergency Medicine

## 2017-07-10 ENCOUNTER — Encounter (HOSPITAL_COMMUNITY): Payer: Self-pay

## 2017-07-10 DIAGNOSIS — Z5321 Procedure and treatment not carried out due to patient leaving prior to being seen by health care provider: Secondary | ICD-10-CM | POA: Diagnosis not present

## 2017-07-10 DIAGNOSIS — R079 Chest pain, unspecified: Secondary | ICD-10-CM | POA: Diagnosis present

## 2017-07-10 LAB — I-STAT TROPONIN, ED: Troponin i, poc: 0.02 ng/mL (ref 0.00–0.08)

## 2017-07-10 LAB — BASIC METABOLIC PANEL
Anion gap: 8 (ref 5–15)
BUN: 22 mg/dL — AB (ref 6–20)
CHLORIDE: 107 mmol/L (ref 101–111)
CO2: 21 mmol/L — AB (ref 22–32)
CREATININE: 1.23 mg/dL (ref 0.61–1.24)
Calcium: 9 mg/dL (ref 8.9–10.3)
GFR calc non Af Amer: 56 mL/min — ABNORMAL LOW (ref >60)
Glucose, Bld: 108 mg/dL — ABNORMAL HIGH (ref 65–99)
POTASSIUM: 3.9 mmol/L (ref 3.5–5.1)
Sodium: 136 mmol/L (ref 135–145)

## 2017-07-10 LAB — CBC
HEMATOCRIT: 40.7 % (ref 39.0–52.0)
Hemoglobin: 13.3 g/dL (ref 13.0–17.0)
MCH: 30 pg (ref 26.0–34.0)
MCHC: 32.7 g/dL (ref 30.0–36.0)
MCV: 91.7 fL (ref 78.0–100.0)
PLATELETS: 181 10*3/uL (ref 150–400)
RBC: 4.44 MIL/uL (ref 4.22–5.81)
RDW: 15.1 % (ref 11.5–15.5)
WBC: 6.8 10*3/uL (ref 4.0–10.5)

## 2017-07-10 NOTE — ED Notes (Signed)
Pt sts they are leaving. Seen leaving ED. Nurse first notified.
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
## 2017-07-15 ENCOUNTER — Ambulatory Visit (HOSPITAL_COMMUNITY)
Admission: EM | Admit: 2017-07-15 | Discharge: 2017-07-15 | Disposition: A | Discharge status: Home / Self Care | Payer: BLUE CROSS/BLUE SHIELD | Attending: Family Medicine | Admitting: Family Medicine

## 2017-07-15 ENCOUNTER — Encounter (HOSPITAL_COMMUNITY): Payer: Self-pay | Admitting: Family Medicine

## 2017-07-15 DIAGNOSIS — S20219A Contusion of unspecified front wall of thorax, initial encounter: Secondary | ICD-10-CM | POA: Diagnosis not present
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
## 2017-07-15 NOTE — Discharge Instructions (Signed)
Ice as needed

## 2017-08-17 ENCOUNTER — Encounter: Payer: BLUE CROSS/BLUE SHIELD | Admitting: Internal Medicine

## 2017-09-07 ENCOUNTER — Encounter: Payer: BLUE CROSS/BLUE SHIELD | Admitting: Internal Medicine

## 2017-09-14 ENCOUNTER — Other Ambulatory Visit: Payer: Self-pay

## 2017-09-14 MED ORDER — FUROSEMIDE 40 MG PO TABS: 40.0000 mg | ORAL_TABLET | Freq: Every day | ORAL | 0 refills | Status: DC | PRN

## 2017-09-14 NOTE — Telephone Encounter (Signed)
Rx(s) sent to pharmacy electronically.  

## 2017-10-19 ENCOUNTER — Other Ambulatory Visit: Payer: Self-pay | Admitting: Cardiology

## 2017-10-19 NOTE — Telephone Encounter (Signed)
REFILL 

## 2017-11-29 ENCOUNTER — Other Ambulatory Visit: Payer: Self-pay | Admitting: Cardiology

## 2018-01-06 DIAGNOSIS — N182 Chronic kidney disease, stage 2 (mild): Secondary | ICD-10-CM | POA: Diagnosis not present

## 2018-01-06 DIAGNOSIS — I130 Hypertensive heart and chronic kidney disease with heart failure and stage 1 through stage 4 chronic kidney disease, or unspecified chronic kidney disease: Secondary | ICD-10-CM | POA: Diagnosis not present

## 2018-01-06 DIAGNOSIS — R7309 Other abnormal glucose: Secondary | ICD-10-CM | POA: Diagnosis not present

## 2018-01-06 DIAGNOSIS — Z1389 Encounter for screening for other disorder: Secondary | ICD-10-CM | POA: Diagnosis not present

## 2018-01-06 DIAGNOSIS — I442 Atrioventricular block, complete: Secondary | ICD-10-CM | POA: Diagnosis not present

## 2018-01-06 DIAGNOSIS — I5022 Chronic systolic (congestive) heart failure: Secondary | ICD-10-CM | POA: Diagnosis not present

## 2018-01-06 DIAGNOSIS — R748 Abnormal levels of other serum enzymes: Secondary | ICD-10-CM | POA: Diagnosis not present

## 2018-03-29 ENCOUNTER — Encounter: Payer: Self-pay | Admitting: Cardiology

## 2018-05-17 ENCOUNTER — Encounter: Payer: Self-pay | Admitting: Internal Medicine

## 2018-05-17 DIAGNOSIS — E291 Testicular hypofunction: Secondary | ICD-10-CM | POA: Diagnosis not present

## 2018-05-17 DIAGNOSIS — N5201 Erectile dysfunction due to arterial insufficiency: Secondary | ICD-10-CM | POA: Diagnosis not present

## 2018-05-20 ENCOUNTER — Other Ambulatory Visit: Payer: Self-pay | Admitting: Cardiology

## 2018-05-31 ENCOUNTER — Encounter: Payer: Self-pay | Admitting: Internal Medicine

## 2018-05-31 ENCOUNTER — Ambulatory Visit (INDEPENDENT_AMBULATORY_CARE_PROVIDER_SITE_OTHER): Payer: Medicare HMO | Admitting: Internal Medicine

## 2018-05-31 VITALS — BP 116/80 | HR 101 | Ht 69.0 in | Wt 243.8 lb

## 2018-05-31 DIAGNOSIS — I442 Atrioventricular block, complete: Secondary | ICD-10-CM | POA: Diagnosis not present

## 2018-05-31 DIAGNOSIS — I420 Dilated cardiomyopathy: Secondary | ICD-10-CM

## 2018-05-31 DIAGNOSIS — I5022 Chronic systolic (congestive) heart failure: Secondary | ICD-10-CM

## 2018-05-31 DIAGNOSIS — Z950 Presence of cardiac pacemaker: Secondary | ICD-10-CM | POA: Diagnosis not present

## 2018-05-31 DIAGNOSIS — I10 Essential (primary) hypertension: Secondary | ICD-10-CM | POA: Diagnosis not present

## 2018-05-31 NOTE — Progress Notes (Signed)
PCP: Dorothyann Peng, MD Primary Cardiologist: Dr Swaziland Primary EP:  Dr Johney Frame  [name] [name] is a 72 y.o. male who presents today for routine electrophysiology followup.  Since last being seen in our clinic, the patient reports doing well.  He is overdue for follow-up with me and Dr Swaziland.  Has not been compliant with remotes.  He has not taken medications in "months".  He lives heavy and hard and has very little regard for his health.  He states "I am moving to [place] in a few months and I will do what I want to there".  Today, he denies symptoms of palpitations, chest pain, shortness of breath,  lower extremity edema, dizziness, presyncope, or syncope.  The patient is otherwise without complaint today.   Past Medical History:  Diagnosis Date  . Chronic renal insufficiency    WITH BASELINE CREATININE OF 1.6  . Chronic systolic CHF (congestive heart failure) (HCC)   . Complete heart block (HCC)    s/p PPM by Dr Reyes Ivan  . Coronary disease    NONOBSTRUCTIVE  . GERD (gastroesophageal reflux disease)   . Gout   . Hyperkalemia   . Hypertension   . Nonischemic cardiomyopathy (HCC)   . Presence of permanent cardiac pacemaker    Past Surgical History:  Procedure Laterality Date  . CARDIAC CATHETERIZATION  08/2013  . EP IMPLANTABLE DEVICE N/A 02/15/2015   Procedure: Lead Revision/Repair;  Surgeon: Hillis Range, MD;  Location: [facility];  Service: Cardiovascular;  Laterality: N/A;  . INGUINAL HERNIA REPAIR Bilateral   . INSERT / REPLACE / REMOVE PACEMAKER  05/20/2009   MDT by Dr Reyes Ivan for complete heart block  . LEFT AND RIGHT HEART CATHETERIZATION WITH CORONARY ANGIOGRAM N/A 08/16/2013   Procedure: LEFT AND RIGHT HEART CATHETERIZATION WITH CORONARY ANGIOGRAM;  Surgeon: [name] Swaziland, MD;  Location: [facility] C[facility];  Service: Cardiovascular;  Laterality: N/A;    ROS- all systems are reviewed and negative except as per HPI above  Current Outpatient Medications  Medication Sig  Dispense Refill  . allopurinol (ZYLOPRIM) 100 MG tablet Take 100 mg by mouth daily.    Marland Kitchen amLODipine (NORVASC) 5 MG tablet Take 5 mg by mouth daily.    . Ascorbic Acid (VITAMIN C) 1000 MG tablet Take 2,000 mg by mouth daily.     Marland Kitchen aspirin 81 MG tablet Take 81 mg by mouth daily.      . carvedilol (COREG) 12.5 MG tablet Take 1 tablet (12.5 mg total) by mouth 2 (two) times daily. 180 tablet 3  . Cholecalciferol (VITAMIN D-3) 1000 UNITS CAPS Take 1,000 Units by mouth daily.    . Colchicine 0.6 MG CAPS Take 1 tablet by mouth daily as needed (gout).     . Fluticasone-Salmeterol (ADVAIR) 250-50 MCG/DOSE AEPB Inhale 1 puff into the lungs as needed (shortness of breath).     . furosemide (LASIX) 40 MG tablet TAKE 1 TABLET (40 MG TOTAL) BY MOUTH DAILY. NEED OV. 15 tablet 0  . GARLIC PO Take 1 tablet by mouth daily.     . Glucosamine HCl (GLUCOSAMINE PO) Take 1 tablet by mouth daily.     . vardenafil (LEVITRA) 20 MG tablet Take 20 mg by mouth as needed for erectile dysfunction.  11   No current facility-administered medications for this visit.     Physical Exam: Vitals:   05/31/18 1514  BP: 116/80  Pulse: (!) 101  SpO2: 96%  Weight: 243 lb 12.8 oz (  110.6 kg)  Height: 5\' 9"  (1.753 m)    GEN- The patient is well appearing, alert and oriented x 3 today.   Head- normocephalic, atraumatic Eyes-  Sclera clear, conjunctiva pink Ears- hearing intact Oropharynx- clear Lungs- Clear to ausculation bilaterally, normal work of breathing Chest- pacemaker pocket is well healed Heart- Regular rate and rhythm, no murmurs, rubs or gallops, PMI not laterally displaced GI- soft, NT, ND, + BS Extremities- no clubbing, cyanosis, or edema  Pacemaker interrogation- reviewed in detail today,  See PACEART report  ekg tracing ordered today is personally reviewed and shows sinus with biv pacing  Assessment and Plan:  1. Symptomatic complete heart block Normal BiV pacemaker function See Pace Art report No  changes today  2. Chronic systolic dysfunction overdue for echo and follow-up with Dr Swaziland Hopefully EF has improved with upgrade to CRT-P He is not taking his medicines as prescribed  3. HTN bp is controlled, though he does not take medicine  He has been noncompliant with device follow-up and remotes as well as general cardiology follow-up.   We had a long discussion today about the importance of appropriate follow-up!  Follow-up with Dr Swaziland at next available [facility] Return to see EP PA in a year  Hillis Range MD, [facility] 05/31/2018 3:29 PM

## 2018-05-31 NOTE — Patient Instructions (Addendum)
Medication Instructions:  Your physician recommends that you continue on your current medications as directed. Please refer to the Current Medication list given to you today.  Labwork: None ordered.  Testing/Procedures: Your physician has requested that you have an echocardiogram. Echocardiography is a painless test that uses sound waves to create images of your heart. It provides your doctor with information about the size and shape of your heart and how well your heart's chambers and valves are working. This procedure takes approximately one hour. There are no restrictions for this procedure.  Please schedule for an ECHO  Follow-Up: Please schedule Pt with Dr. Swaziland first available (NO APP).  Your physician wants you to follow-up in: one year with Francis Dowse, PA.   You will receive a reminder letter in the mail two months in advance. If you don't receive a letter, please call our office to schedule the follow-up appointment.  Remote monitoring is used to monitor your Pacemaker from home. This monitoring reduces the number of office visits required to check your device to one time per year. It allows [place] to keep an eye on the functioning of your device to ensure it is working properly. You are scheduled for a device check from home on 08/30/2018. You may send your transmission at any time that day. If you have a wireless device, the transmission will be sent automatically. After your physician reviews your transmission, you will receive a postcard with your next transmission date.  Any Other Special Instructions Will Be Listed Below (If Applicable).  If you need a refill on your cardiac medications before your next appointment, please call your pharmacy.

## 2018-06-01 NOTE — Progress Notes (Signed)
[name] Date of Birth: [birth year]   History of Present Illness: [name] is seen today for followup of congestive heart failure. He was last seen by me in September 2016. He has been noncompliant with medical therapy and follow up here and in the device clinic. He has a history of complete heart block and presented in September of 2010 with a heart rate of 10. He had a permanent pacemaker placed at that time. He did have acute renal failure and hyperkalemia at the time. He was on either an ACE inhibitor or ARB at that time. In November 2014 he presented with worsening CHF.  Echocardiogram showed marked LV dysfunction with ejection fraction of 20-25%. He had moderate pulmonary HTN. He was started on nitrates and hydralazine. He underwent right and left heart cath on 08/16/13 with moderate pulmonary HTN and elevated filling pressures. 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   He declined ICD. In June 2016 he was found to have lead fractures of atrial and ventricular leads and underwent revision of pacemaker leads and upgrade to CRT device. He was seen recently by Dr Johney Frame in device clinic and Echo ordered. This showed persistent LV dysfunction with EF 25-30%.    Since then he states he feels  great. He denies any SOB or chest pain. No dizziness or palpitations. No edema. Weight is stable. He ran out of his Coreg before and quit taking it. Tells me he is planning to move to [place] this year   Current Outpatient Medications on File Prior to Visit  Medication Sig Dispense Refill  . allopurinol (ZYLOPRIM) 100 MG tablet Take 100 mg by mouth daily.    Marland Kitchen amLODipine (NORVASC) 5 MG tablet Take 5 mg by mouth daily.    . Ascorbic Acid (VITAMIN C) 1000 MG tablet Take 2,000 mg by mouth daily.     Marland Kitchen aspirin 81 MG tablet Take 81 mg by mouth daily.      . Cholecalciferol (VITAMIN D-3) 1000 UNITS CAPS Take 1,000 Units by mouth daily.    . Colchicine 0.6 MG CAPS Take 1 tablet by mouth daily as  needed (gout).     . Fluticasone-Salmeterol (ADVAIR) 250-50 MCG/DOSE AEPB Inhale 1 puff into the lungs as needed (shortness of breath).     . furosemide (LASIX) 40 MG tablet TAKE 1 TABLET (40 MG TOTAL) BY MOUTH DAILY. NEED OV. 15 tablet 0  . GARLIC PO Take 1 tablet by mouth daily.     . Glucosamine HCl (GLUCOSAMINE PO) Take 1 tablet by mouth daily.     . vardenafil (LEVITRA) 20 MG tablet Take 20 mg by mouth as needed for erectile dysfunction.  11   No current facility-administered medications on file prior to visit.     Allergies  Allergen Reactions  . Avalide [Irbesartan-Hydrochlorothiazide] Anaphylaxis  . Penicillins Anaphylaxis  . Tinactin [Tolnaftate] Hives    Rash     Past Medical History:  Diagnosis Date  . Chronic renal insufficiency    WITH BASELINE CREATININE OF 1.6  . Chronic systolic CHF (congestive heart failure) (HCC)   . Complete heart block (HCC)    s/p PPM by Dr [name]  . Coronary disease    NONOBSTRUCTIVE  . GERD (gastroesophageal reflux disease)   . Gout   . Hyperkalemia   . Hypertension   . Nonischemic cardiomyopathy (HCC)   . Presence of permanent cardiac pacemaker     Past Surgical History:  Procedure Laterality Date  .  CARDIAC CATHETERIZATION  08/2013  . EP IMPLANTABLE DEVICE N/A 02/15/2015   Procedure: Lead Revision/Repair;  Surgeon: Hillis Range, MD;  Location: [facility];  Service: Cardiovascular;  Laterality: N/A;  . INGUINAL HERNIA REPAIR Bilateral   . INSERT / REPLACE / REMOVE PACEMAKER  05/20/2009   MDT by Dr [name] for complete heart block  . LEFT AND RIGHT HEART CATHETERIZATION WITH CORONARY ANGIOGRAM N/A 08/16/2013   Procedure: LEFT AND RIGHT HEART CATHETERIZATION WITH CORONARY ANGIOGRAM;  Surgeon: [name] Swaziland, MD;  Location: [facility] CATH LAB;  Service: Cardiovascular;  Laterality: N/A;    Social History   Tobacco Use  Smoking Status Former Smoker  . Packs/day: 1.00  . Years: 15.00  . Pack years: 15.00  . Types: Cigarettes   Smokeless Tobacco Never Used  Tobacco Comment   "quit smoking cigarettes in 1985"    Social History   Substance and Sexual Activity  Alcohol Use Yes  . Alcohol/week: 7.0 standard drinks  . Types: 7 Shots of liquor per week    Family History  Problem Relation Age of Onset  . Diabetes Father     Review of Systems:  As note in HPI. All other systems were reviewed and are negative.  Physical Exam: BP (!) 156/109   Pulse 100   Ht 5\' 9"  (1.753 m)   Wt 246 lb 9.6 oz (111.9 kg)   SpO2 99%   BMI 36.42 kg/m   GENERAL:  Well appearing overweight BM in NAD HEENT:  PERRL, EOMI, sclera are clear. Oropharynx is clear. NECK:  No jugular venous distention, carotid upstroke brisk and symmetric, no bruits, no thyromegaly or adenopathy LUNGS:  Clear to auscultation bilaterally CHEST:  Unremarkable HEART:  RRR,  PMI not displaced or sustained,S1 and S2 within normal limits, no S3, no S4: no clicks, no rubs, no murmurs ABD:  Soft, nontender. BS +, no masses or bruits. No hepatomegaly, no splenomegaly EXT:  2 + pulses throughout, no edema, no cyanosis no clubbing SKIN:  Warm and dry.  No rashes NEURO:  Alert and oriented x 3. Cranial nerves II through XII intact. PSYCH:  Cognitively intact    LABORATORY DATA: Lab Results  Component Value Date   WBC 6.8 07/10/2017   HGB 13.3 07/10/2017   HCT 40.7 07/10/2017   PLT 181 07/10/2017   GLUCOSE 108 (H) 07/10/2017   CHOL 162 07/28/2013   TRIG 54.0 07/28/2013   HDL 47.30 07/28/2013   LDLCALC 104 (H) 07/28/2013   ALT 44 07/28/2013   AST 33 07/28/2013   NA 136 07/10/2017   K 3.9 07/10/2017   CL 107 07/10/2017   CREATININE 1.23 07/10/2017   BUN 22 (H) 07/10/2017   CO2 21 (L) 07/10/2017   TSH 1.67 07/28/2013   INR 1.18 02/14/2015    Dated 07/09/17: cholesterol 203, triglycerides 62, HDL 72, LDL 119.  Dated 01/06/18: creatinine 1.48. A1c 5.9%. Chemistries Normal   Echo 06/02/18: Study Conclusions  - Left ventricle: The cavity size was  moderately dilated. There was   moderate concentric hypertrophy. Systolic function was severely   reduced. The estimated ejection fraction was in the range of 25%   to 30%. Severe diffuse hypokinesis. There was an increased   relative contribution of atrial contraction to ventricular   filling. Doppler parameters are consistent with abnormal left   ventricular relaxation (grade 1 diastolic dysfunction). - Aortic valve: Trileaflet; moderately thickened, moderately   calcified leaflets. - Mitral valve: There was mild regurgitation. - Left atrium:  The atrium was mildly dilated. - Right ventricle: Pacer wire or catheter noted in right ventricle.   Systolic function was mildly to moderately reduced. - Right atrium: Pacer wire or catheter noted in right atrium. - Pulmonary arteries: Systolic pressure could not be accurately   estimated.  Assessment / Plan: 1. Chronic systolic congestive heart failure.  Ejection fraction of 25-30%. No improvement in Echo despite optimal medical therapy and CRT therapy.  Nonischemic.  He is intolerant to ACE inhibitors/ARBs related to hyperkalemia and acute renal failure. I have resumed Coreg today 12.5 mg bid.   2. CKD stage 3. Followed by Dr. Hyman Hopes- Nephrology.  3 .HTN- control has been satisfactory. Continue amlodipine. Resume Coreg continue lasix.   4. Complete heart block status post permanent pacemaker. Now with CRT device.  I will plan on follow up in one year unless he has moved to [place].

## 2018-06-02 ENCOUNTER — Ambulatory Visit (HOSPITAL_COMMUNITY): Payer: Medicare HMO | Attending: Cardiology

## 2018-06-02 ENCOUNTER — Other Ambulatory Visit: Payer: Self-pay

## 2018-06-02 DIAGNOSIS — I110 Hypertensive heart disease with heart failure: Secondary | ICD-10-CM | POA: Diagnosis not present

## 2018-06-02 DIAGNOSIS — I442 Atrioventricular block, complete: Secondary | ICD-10-CM | POA: Insufficient documentation

## 2018-06-02 DIAGNOSIS — I420 Dilated cardiomyopathy: Secondary | ICD-10-CM | POA: Diagnosis not present

## 2018-06-02 DIAGNOSIS — I429 Cardiomyopathy, unspecified: Secondary | ICD-10-CM | POA: Diagnosis not present

## 2018-06-02 DIAGNOSIS — I5022 Chronic systolic (congestive) heart failure: Secondary | ICD-10-CM

## 2018-06-02 DIAGNOSIS — I2510 Atherosclerotic heart disease of native coronary artery without angina pectoris: Secondary | ICD-10-CM | POA: Insufficient documentation

## 2018-06-02 DIAGNOSIS — I340 Nonrheumatic mitral (valve) insufficiency: Secondary | ICD-10-CM | POA: Diagnosis not present

## 2018-06-03 ENCOUNTER — Ambulatory Visit (INDEPENDENT_AMBULATORY_CARE_PROVIDER_SITE_OTHER): Payer: Medicare HMO | Admitting: Cardiology

## 2018-06-03 ENCOUNTER — Encounter: Payer: Self-pay | Admitting: Cardiology

## 2018-06-03 VITALS — BP 156/109 | HR 100 | Ht 69.0 in | Wt 246.6 lb

## 2018-06-03 DIAGNOSIS — I5022 Chronic systolic (congestive) heart failure: Secondary | ICD-10-CM

## 2018-06-03 DIAGNOSIS — I442 Atrioventricular block, complete: Secondary | ICD-10-CM

## 2018-06-03 DIAGNOSIS — Z950 Presence of cardiac pacemaker: Secondary | ICD-10-CM | POA: Diagnosis not present

## 2018-06-03 DIAGNOSIS — I10 Essential (primary) hypertension: Secondary | ICD-10-CM

## 2018-06-03 DIAGNOSIS — I420 Dilated cardiomyopathy: Secondary | ICD-10-CM | POA: Diagnosis not present

## 2018-06-03 MED ORDER — CARVEDILOL 12.5 MG PO TABS: 12.5000 mg | ORAL_TABLET | Freq: Two times a day (BID) | ORAL | 3 refills | Status: DC

## 2018-06-03 NOTE — Patient Instructions (Signed)
Continue your current therapy  We will renew Coreg 12.5 mg twice a day  Follow up in one year

## 2018-06-08 ENCOUNTER — Other Ambulatory Visit: Payer: Self-pay | Admitting: Internal Medicine

## 2018-06-16 LAB — CUP PACEART INCLINIC DEVICE CHECK
Battery Remaining Longevity: 48 mo
Battery Voltage: 2.99 V
Brady Statistic AP VP Percent: 2.2 %
Brady Statistic AS VP Percent: 97.7 %
Implantable Lead Implant Date: 20160609
Implantable Lead Implant Date: 20160609
Implantable Lead Location: 753858
Implantable Lead Location: 753859
Implantable Lead Model: 5076
Implantable Pulse Generator Implant Date: 20160609
Lead Channel Impedance Value: 494 Ohm
Lead Channel Impedance Value: 950 Ohm
Lead Channel Pacing Threshold Amplitude: 1 V
Lead Channel Pacing Threshold Amplitude: 1.75 V
Lead Channel Pacing Threshold Pulse Width: 0.4 ms
Lead Channel Sensing Intrinsic Amplitude: 4 mV
Lead Channel Setting Pacing Amplitude: 2.75 V
Lead Channel Setting Pacing Pulse Width: 0.4 ms
MDC IDC LEAD IMPLANT DT: 20160609
MDC IDC LEAD LOCATION: 753860
MDC IDC MSMT LEADCHNL RA PACING THRESHOLD PULSEWIDTH: 0.4 ms
MDC IDC MSMT LEADCHNL RV IMPEDANCE VALUE: 513 Ohm
MDC IDC MSMT LEADCHNL RV PACING THRESHOLD AMPLITUDE: 1 V
MDC IDC MSMT LEADCHNL RV PACING THRESHOLD PULSEWIDTH: 0.4 ms
MDC IDC SESS DTM: 20191009111043
MDC IDC SET LEADCHNL LV PACING PULSEWIDTH: 0.4 ms
MDC IDC SET LEADCHNL RA PACING AMPLITUDE: 2.5 V
MDC IDC SET LEADCHNL RV PACING AMPLITUDE: 2.5 V
MDC IDC SET LEADCHNL RV SENSING SENSITIVITY: 8 mV
MDC IDC STAT BRADY AP VS PERCENT: 0.1 % — AB
MDC IDC STAT BRADY AS VS PERCENT: 0.2 %

## 2018-07-28 ENCOUNTER — Other Ambulatory Visit: Payer: Self-pay | Admitting: Internal Medicine

## 2018-08-10 ENCOUNTER — Ambulatory Visit (INDEPENDENT_AMBULATORY_CARE_PROVIDER_SITE_OTHER): Payer: Medicare HMO | Admitting: Internal Medicine

## 2018-08-10 ENCOUNTER — Encounter: Payer: Self-pay | Admitting: Internal Medicine

## 2018-08-10 VITALS — BP 124/76 | HR 91 | Temp 97.1°F | Ht 65.5 in | Wt 248.6 lb

## 2018-08-10 DIAGNOSIS — Z6841 Body Mass Index (BMI) 40.0 and over, adult: Secondary | ICD-10-CM

## 2018-08-10 DIAGNOSIS — Z0000 Encounter for general adult medical examination without abnormal findings: Secondary | ICD-10-CM

## 2018-08-10 DIAGNOSIS — I130 Hypertensive heart and chronic kidney disease with heart failure and stage 1 through stage 4 chronic kidney disease, or unspecified chronic kidney disease: Secondary | ICD-10-CM | POA: Diagnosis not present

## 2018-08-10 DIAGNOSIS — N183 Chronic kidney disease, stage 3 unspecified: Secondary | ICD-10-CM

## 2018-08-10 DIAGNOSIS — M1A30X0 Chronic gout due to renal impairment, unspecified site, without tophus (tophi): Secondary | ICD-10-CM | POA: Diagnosis not present

## 2018-08-10 DIAGNOSIS — R351 Nocturia: Secondary | ICD-10-CM

## 2018-08-10 DIAGNOSIS — R7309 Other abnormal glucose: Secondary | ICD-10-CM

## 2018-08-10 DIAGNOSIS — E66813 Obesity, class 3: Secondary | ICD-10-CM

## 2018-08-10 LAB — POCT URINALYSIS DIPSTICK
BILIRUBIN UA: NEGATIVE
Blood, UA: NEGATIVE
Glucose, UA: NEGATIVE
Ketones, UA: NEGATIVE
Leukocytes, UA: NEGATIVE
NITRITE UA: NEGATIVE
PH UA: 6 (ref 5.0–8.0)
PROTEIN UA: NEGATIVE
Spec Grav, UA: 1.015 (ref 1.010–1.025)
Urobilinogen, UA: 0.2 E.U./dL

## 2018-08-10 LAB — POCT UA - MICROALBUMIN
ALBUMIN/CREATININE RATIO, URINE, POC: 30
CREATININE, POC: 100 mg/dL
Microalbumin Ur, POC: 30 mg/L

## 2018-08-10 NOTE — Patient Instructions (Signed)
  [name] , Thank you for taking time to come for your Medicare Wellness Visit. I appreciate your ongoing commitment to your health goals. Please review the following plan we discussed and let me know if I can assist you in the future.   These are the goals we discussed: Goals    . DIET - REDUCE FAST FOOD INTAKE       This is a list of the screening recommended for you and due dates:  Health Maintenance  Topic Date Due  . Tetanus Vaccine  01/25/1963  . Colon Cancer Screening  01/24/1994  . Pneumonia vaccines (1 of 2 - PCV13) 01/24/2009  . Flu Shot  08/19/2019*  .  Hepatitis C: One time screening is recommended by Center for Disease Control  (CDC) for  adults born from [birth year] through 1967.   Completed  *Topic was postponed. The date shown is not the original due date.

## 2018-08-10 NOTE — Progress Notes (Signed)
Subjective:   [name] is a 72 y.o. male who presents for Medicare Annual/Subsequent preventive examination.  Hypertension  This is a chronic problem. The current episode started more than 1 year ago. The problem has been gradually improving since onset. The problem is controlled. Pertinent negatives include no blurred vision, chest pain, headaches or shortness of breath. Risk factors for coronary artery disease include dyslipidemia, male gender and obesity. Compliance problems include diet.  Identifiable causes of hypertension include chronic renal disease.     Review of Systems:   Review of Systems  Constitutional: Negative.   HENT: Negative.   Eyes: Negative.  Negative for blurred vision.  Respiratory: Negative.  Negative for shortness of breath.   Cardiovascular: Negative.  Negative for chest pain.  Gastrointestinal: Negative.   Genitourinary: Negative.   Musculoskeletal: Negative.   Skin: Negative.   Neurological: Negative.  Negative for headaches.  Endo/Heme/Allergies: Negative.   Psychiatric/Behavioral: Negative.    Cardiac Risk Factors include: obesity (BMI >30kg/m2);hypertension;male gender;advanced age (>54men, >35 women);microalbuminuria     Objective:    Vitals: BP 124/76 (BP Location: Left Arm, Patient Position: Sitting, Cuff Size: Normal)   Pulse 91   Temp (!) 97.1 F (36.2 C) (Oral)   Ht 5' 5.5" (1.664 m)   Wt 248 lb 9.6 oz (112.8 kg)   BMI 40.74 kg/m   Body mass index is 40.74 kg/m.  BP 124/76 (BP Location: Left Arm, Patient Position: Sitting, Cuff Size: Normal)   Pulse 91   Temp (!) 97.1 F (36.2 C) (Oral)   Ht 5' 5.5" (1.664 m)   Wt 248 lb 9.6 oz (112.8 kg)   BMI 40.74 kg/m   General Appearance:    Alert, cooperative, no distress, appears stated age  Head:    Normocephalic, without obvious abnormality, atraumatic  Eyes:    PERRL, conjunctiva/corneas clear, EOM's intact, fundi    benign, both eyes       Ears:    Normal TM's and  external ear canals, both ears  Nose:   Nares normal, septum midline, mucosa normal, no drainage   or sinus tenderness  Throat:   Lips, mucosa, and tongue normal; teeth and gums normal  Neck:   Supple, symmetrical, trachea midline, no adenopathy;       thyroid:  No enlargement/tenderness/nodules; no carotid   bruit or JVD  Back:     Symmetric, no curvature, ROM normal, no CVA tenderness  Lungs:     Clear to auscultation bilaterally, respirations unlabored  Chest wall:    No tenderness or deformity  Heart:    Regular rate and rhythm, S1 and S2 normal, no murmur, rub   or gallop  Abdomen:     Soft, obese, non-tender, bowel sounds active all four quadrants, pos umbilical hernia   no masses, no organomegaly  Genitalia:    Deferred as per patient  Rectal:    Deferred as per patient  Extremities:   Extremities edematous, pitting, atraumatic, no cyanosis or edema  Pulses:   2+ and symmetric all extremities  Skin:   Skin color, texture, turgor normal, no rashes or lesions  Lymph nodes:   Cervical, supraclavicular, and axillary nodes normal  Neurologic:   CNII-XII intact. Normal strength, sensation and reflexes      throughout    Advanced Directives 07/10/2017 02/14/2015 08/16/2013  Does Patient Have a Medical Advance Directive? No No Patient does not have advance directive  Would patient like information on creating a medical advance directive?  No - Patient declined No - patient declined information -  Pre-existing out of facility DNR order (yellow form or pink MOST form) - - No    Tobacco Social History   Tobacco Use  Smoking Status Former Smoker  . Packs/day: 1.00  . Years: 15.00  . Pack years: 15.00  . Types: Cigarettes  Smokeless Tobacco Never Used  Tobacco Comment   "quit smoking cigarettes in 1985"     Counseling given: Not Answered Comment: "quit smoking cigarettes in 1985"   Clinical Intake:  Pre-visit preparation completed: No  Pain : No/denies pain Pain Score: 0-No  pain  Nutritional Status: BMI > 30  Obese Diabetes: No  How often do you need to have someone help you when you read instructions, pamphlets, or other written materials from your doctor or pharmacy?: 1 - Never What is the last grade level you completed in school?: 2 years of college, degree in education in [employer]  Interpreter Needed?: No  Information entered by :: Dorothyann Peng, MD  Past Medical History:  Diagnosis Date  . Chronic renal insufficiency    WITH BASELINE CREATININE OF 1.6  . Chronic systolic CHF (congestive heart failure) (HCC)   . Complete heart block (HCC)    s/p PPM by Dr Reyes Ivan  . Coronary disease    NONOBSTRUCTIVE  . GERD (gastroesophageal reflux disease)   . Gout   . Hyperkalemia   . Hypertension   . Nonischemic cardiomyopathy (HCC)   . Presence of permanent cardiac pacemaker    Past Surgical History:  Procedure Laterality Date  . CARDIAC CATHETERIZATION  08/2013  . EP IMPLANTABLE DEVICE N/A 02/15/2015   Procedure: Lead Revision/Repair;  Surgeon: Hillis Range, MD;  Location: [facility] INVASIVE CV LAB;  Service: Cardiovascular;  Laterality: N/A;  . INGUINAL HERNIA REPAIR Bilateral   . INSERT / REPLACE / REMOVE PACEMAKER  05/20/2009   MDT by Dr Reyes Ivan for complete heart block  . LEFT AND RIGHT HEART CATHETERIZATION WITH CORONARY ANGIOGRAM N/A 08/16/2013   Procedure: LEFT AND RIGHT HEART CATHETERIZATION WITH CORONARY ANGIOGRAM;  Surgeon: [name] Swaziland, MD;  Location: [facility] CATH LAB;  Service: Cardiovascular;  Laterality: N/A;   Family History  Problem Relation Age of Onset  . Diabetes Father    Social History   Socioeconomic History  . Marital status: Divorced    Spouse name: Not on file  . Number of children: 1  . Years of education: 86  . Highest education level: Not on file  Occupational History  . Occupation: Sports administrator: Biomedical scientist  Social Needs  . Financial resource strain: Not on file  . Food insecurity:    Worry: Not on file     Inability: Not on file  . Transportation needs:    Medical: Not on file    Non-medical: Not on file  Tobacco Use  . Smoking status: Former Smoker    Packs/day: 1.00    Years: 15.00    Pack years: 15.00    Types: Cigarettes  . Smokeless tobacco: Never Used  . Tobacco comment: "quit smoking cigarettes in 1985"  Substance and Sexual Activity  . Alcohol use: Yes    Alcohol/week: 7.0 standard drinks    Types: 7 Shots of liquor per week  . Drug use: No  . Sexual activity: Not Currently  Lifestyle  . Physical activity:    Days per week: Not on file    Minutes per session: Not on file  . Stress: Not  on file  Relationships  . Social connections:    Talks on phone: Not on file    Gets together: Not on file    Attends religious service: Not on file    Active member of club or organization: Not on file    Attends meetings of clubs or organizations: Not on file    Relationship status: Not on file  Other Topics Concern  . Not on file  Social History Narrative   Lives in [place].   Drinks a couple of coffees a day   Lives at home alone with his dog    Outpatient Encounter Medications as of 08/10/2018  Medication Sig  . ADVAIR DISKUS 250-50 MCG/DOSE AEPB INHALE 1 PUFF TWICE A DAY IN MORNING AND EVENING APPROXIMATELY 12 HOURS APART  . allopurinol (ZYLOPRIM) 100 MG tablet TAKE 1 TABLET BY MOUTH EVERY DAY  . Ascorbic Acid (VITAMIN C) 1000 MG tablet Take 2,000 mg by mouth daily.   Marland Kitchen aspirin 81 MG tablet Take 81 mg by mouth daily.    . carvedilol (COREG) 12.5 MG tablet Take 1 tablet (12.5 mg total) by mouth 2 (two) times daily.  . Cholecalciferol (VITAMIN D-3) 1000 UNITS CAPS Take 1,000 Units by mouth daily.  . furosemide (LASIX) 40 MG tablet TAKE 1 TABLET (40 MG TOTAL) BY MOUTH DAILY. NEED OV.  Marland Kitchen amLODipine (NORVASC) 5 MG tablet TAKE 1 TABLET BY MOUTH EVERY DAY  . Colchicine 0.6 MG CAPS Take 1 tablet by mouth daily as needed (gout).   . [DISCONTINUED] GARLIC PO Take 1 tablet by mouth  daily.   . [DISCONTINUED] Glucosamine HCl (GLUCOSAMINE PO) Take 1 tablet by mouth daily.   . [DISCONTINUED] vardenafil (LEVITRA) 20 MG tablet Take 20 mg by mouth as needed for erectile dysfunction.   No facility-administered encounter medications on file as of 08/10/2018.     Activities of Daily Living In your present state of health, do you have any difficulty performing the following activities: 08/10/2018  Hearing? N  Vision? N  Difficulty concentrating or making decisions? N  Walking or climbing stairs? N  Dressing or bathing? N  Doing errands, shopping? N  Preparing Food and eating ? N  Using the Toilet? N  In the past six months, have you accidently leaked urine? N  Do you have problems with loss of bowel control? N  Managing your Medications? N  Managing your Finances? N  Housekeeping or managing your Housekeeping? N  Some recent data might be hidden    Patient Care Team: Dorothyann Peng, MD as PCP - General (Internal Medicine)   Assessment:   This is a routine wellness examination for Elvir.  Exercise Activities and Dietary recommendations Current Exercise Habits: Home exercise routine, Type of exercise: Other - see comments;strength training/weights, Time (Minutes): 30, Frequency (Times/Week): 5, Weekly Exercise (Minutes/Week): 150, Intensity: Moderate, Exercise limited by: cardiac condition(s)  Goals    . DIET - REDUCE FAST FOOD INTAKE       Fall Risk Fall Risk  08/10/2018  Falls in the past year? 0   Is the patient's home free of loose throw rugs in walkways, pet beds, electrical cords, etc?   No. He does not wan to remove them      Grab bars in the bathroom? no      Handrails on the stairs?   yes      Adequate lighting?   yes  Timed Get Up and Go Performed:  Yes  Depression Screen PHQ 2/9 Scores  08/10/2018  PHQ - 2 Score 0    Cognitive Function     Mini-Cog - 08/10/18 1415    Normal clock drawing test?  yes    How many words correct?  2        There is no immunization history on file for this patient.  Qualifies for Shingles Vaccine?   Screening Tests Health Maintenance  Topic Date Due  . TETANUS/TDAP  01/25/1963  . COLONOSCOPY  01/24/1994  . PNA vac Low Risk Adult (1 of 2 - PCV13) 01/24/2009  . INFLUENZA VACCINE  08/19/2019 (Originally 04/08/2018)  . Hepatitis C Screening  Completed   Cancer Screenings: Lung: Low Dose CT Chest recommended if Age 74-80 years, 30 pack-year currently smoking OR have quit w/in 15years. Patient does not qualify.  He has not smoked in 50 years.  Colorectal: I will request records.   Additional Screenings: Hepatitis C Screening: already performed in 2016, negative.       Plan:    1. Encounter for Medicare annual wellness exam  A full exam was performed. DRE deferred as per patient's request. THE ANNUAL WELLNESS VISIT WAS PERFORMED INCLUDING DISCUSSION OF ADVANCED DIRECTIVES, ASSESSMENT OF COGNITIVE FUNCTION AND FUNCTIONAL ASSESSMENT.  PATIENT HAS BEEN ADVISED TO GET 30-45 MINUTES REGULAR EXERCISE NO LESS THAN FOUR TO FIVE DAYS PER WEEK - BOTH WEIGHTBEARING EXERCISES AND AEROBIC ARE RECOMMENDED.  HE IS ADVISED TO FOLLOW A HEALTHY DIET WITH AT LEAST SIX FRUITS/VEGGIES PER DAY, DECREASE INTAKE OF RED MEAT, AND TO INCREASE FISH INTAKE TO TWO DAYS PER WEEK.  MEATS/FISH SHOULD NOT BE FRIED, BAKED OR BROILED IS PREFERABLE.  I SUGGEST WEARING SPF 50 SUNSCREEN ON EXPOSED PARTS AND ESPECIALLY WHEN IN THE DIRECT SUNLIGHT FOR AN EXTENDED PERIOD OF TIME.  PLEASE AVOID FAST FOOD RESTAURANTS AND INCREASE YOUR WATER INTAKE.   2. 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 He will continue with current meds. He is encouraged to avoid adding salt to his foods. He is encouraged to avoid packaged foods which tend to be high in sodium. He will rto in six months for re-evaluation.   - EKG 12-Lead - Basic Metabolic Panel with GFR - Hepatic function panel - Hepatitis C antibody  screen - Lipid panel - CBC no Diff - POCT Urinalysis Dipstick (81002) - POCT UA - Microalbumin  3. Chronic renal disease, stage III (HCC)  Chronic. He is encouraged to stay well hydrated. He is also encouraged to cut back on his meat intake.   4. Chronic gout due to renal impairment without tophus, unspecified site  I will check a uric acid level. He is encouraged to stay well hydrated and to limit his meat intake.  - Uric acid  5. Other abnormal glucose  HIS A1C HAS BEEN ELEVATED IN THE PAST. I WILL CHECK AN A1C, BMET TODAY. HE WAS ENCOURAGED TO AVOID SUGARY BEVERAGES AND PROCESSED FOODS INCLUDNG BREADS, RICE AND PASTA.   - Hemoglobin A1c  6. Nocturia  He declines DRE. I will check f/u PSA.  - PSA  7. Class 3 severe obesity due to excess calories with serious comorbidity and body mass index (BMI) of 40.0 to 44.9 in adult [facility])  He is encouraged to strive for BMI less than 35 to decrease cardiac risk. He is encouraged to incorporate more exercise into his daily routine as tolerated. He is also encouraged to avoid sugary beverages and processed foods including white breads, rice and pasta.    I have  personally reviewed and noted the following in the patient's chart:   . Medical and social history . Use of alcohol, tobacco or illicit drugs  . Current medications and supplements . Functional ability and status . Nutritional status . Physical activity . Advanced directives . List of other physicians . Hospitalizations, surgeries, and ER visits in previous 12 months . Vitals . Screenings to include cognitive, depression, and falls . Referrals and appointments  In addition, I have reviewed and discussed with patient certain preventive protocols, quality metrics, and best practice recommendations. A written personalized care plan for preventive services as well as general preventive health recommendations were provided to patient.     [name], MD  08/10/2018

## 2018-08-11 LAB — CBC
HEMATOCRIT: 40.9 % (ref 37.5–51.0)
HEMOGLOBIN: 13.8 g/dL (ref 13.0–17.7)
MCH: 29.7 pg (ref 26.6–33.0)
MCHC: 33.7 g/dL (ref 31.5–35.7)
MCV: 88 fL (ref 79–97)
Platelets: 228 10*3/uL (ref 150–450)
RBC: 4.65 x10E6/uL (ref 4.14–5.80)
RDW: 13.4 % (ref 12.3–15.4)
WBC: 5.5 10*3/uL (ref 3.4–10.8)

## 2018-08-11 LAB — HEPATIC FUNCTION PANEL
ALT: 27 IU/L (ref 0–44)
AST: 30 IU/L (ref 0–40)
Albumin: 4.1 g/dL (ref 3.5–4.8)
Alkaline Phosphatase: 277 IU/L — ABNORMAL HIGH (ref 39–117)
BILIRUBIN, DIRECT: 0.19 mg/dL (ref 0.00–0.40)
Bilirubin Total: 0.8 mg/dL (ref 0.0–1.2)
TOTAL PROTEIN: 7.6 g/dL (ref 6.0–8.5)

## 2018-08-11 LAB — LIPID PANEL
CHOLESTEROL TOTAL: 230 mg/dL — AB (ref 100–199)
Chol/HDL Ratio: 4 ratio (ref 0.0–5.0)
HDL: 57 mg/dL (ref >39)
LDL Calculated: 131 mg/dL — ABNORMAL HIGH (ref 0–99)
TRIGLYCERIDES: 208 mg/dL — AB (ref 0–149)
VLDL Cholesterol Cal: 42 mg/dL — ABNORMAL HIGH (ref 5–40)

## 2018-08-11 LAB — HEPATITIS C ANTIBODY: HEP C VIRUS AB: 0.1 {s_co_ratio} (ref 0.0–0.9)

## 2018-08-11 LAB — HEMOGLOBIN A1C
ESTIMATED AVERAGE GLUCOSE: 131 mg/dL
HEMOGLOBIN A1C: 6.2 % — AB (ref 4.8–5.6)

## 2018-08-11 LAB — PSA: Prostate Specific Ag, Serum: 1.4 ng/mL (ref 0.0–4.0)

## 2018-08-11 LAB — URIC ACID: Uric Acid: 7.6 mg/dL (ref 3.7–8.6)

## 2018-08-12 NOTE — Progress Notes (Signed)
Your alkaline phosphatase is quite elevated. Are you having joint pains? You are negative for hepatitis c virus. Your hba1c is 6.2, you are prediabetic. Please avoid sugary beverages and increase daily activity. Your cholesterol and triglycerides are elevated. pls avoid fried foods increase exercise. I would like to start you on a cholesterol medication. Do you agree to start this? Your psa is wnl. Your blood count Is nl. Your uric acid level is elevated. Are you taking allopurinol each and every day? Your kidney fxn is stable.

## 2018-08-15 ENCOUNTER — Encounter: Payer: Self-pay | Admitting: Internal Medicine

## 2018-09-03 ENCOUNTER — Encounter: Payer: Self-pay | Admitting: Cardiology

## 2018-11-01 DIAGNOSIS — I509 Heart failure, unspecified: Secondary | ICD-10-CM | POA: Diagnosis not present

## 2018-11-01 DIAGNOSIS — Z7951 Long term (current) use of inhaled steroids: Secondary | ICD-10-CM | POA: Diagnosis not present

## 2018-11-01 DIAGNOSIS — N529 Male erectile dysfunction, unspecified: Secondary | ICD-10-CM | POA: Diagnosis not present

## 2018-11-01 DIAGNOSIS — I110 Hypertensive heart disease with heart failure: Secondary | ICD-10-CM | POA: Diagnosis not present

## 2018-11-01 DIAGNOSIS — E669 Obesity, unspecified: Secondary | ICD-10-CM | POA: Diagnosis not present

## 2018-11-01 DIAGNOSIS — M109 Gout, unspecified: Secondary | ICD-10-CM | POA: Diagnosis not present

## 2018-11-01 DIAGNOSIS — Z6836 Body mass index (BMI) 36.0-36.9, adult: Secondary | ICD-10-CM | POA: Diagnosis not present

## 2018-11-01 DIAGNOSIS — J40 Bronchitis, not specified as acute or chronic: Secondary | ICD-10-CM | POA: Diagnosis not present

## 2018-11-01 DIAGNOSIS — Z809 Family history of malignant neoplasm, unspecified: Secondary | ICD-10-CM | POA: Diagnosis not present

## 2018-11-01 DIAGNOSIS — Z803 Family history of malignant neoplasm of breast: Secondary | ICD-10-CM | POA: Diagnosis not present

## 2018-11-07 ENCOUNTER — Other Ambulatory Visit: Payer: Self-pay | Admitting: Internal Medicine

## 2018-11-27 ENCOUNTER — Other Ambulatory Visit: Payer: Self-pay | Admitting: Internal Medicine

## 2019-02-22 ENCOUNTER — Other Ambulatory Visit: Payer: Self-pay | Admitting: Internal Medicine

## 2019-02-22 ENCOUNTER — Telehealth: Payer: Self-pay

## 2019-02-22 NOTE — Telephone Encounter (Signed)
Left voicemail. Pt needs an appt for htn fu

## 2019-02-23 ENCOUNTER — Ambulatory Visit (INDEPENDENT_AMBULATORY_CARE_PROVIDER_SITE_OTHER): Payer: Medicare HMO | Admitting: Internal Medicine

## 2019-02-23 ENCOUNTER — Encounter: Payer: Self-pay | Admitting: Internal Medicine

## 2019-02-23 ENCOUNTER — Other Ambulatory Visit: Payer: Self-pay

## 2019-02-23 VITALS — BP 158/88 | HR 85 | Temp 98.3°F | Ht 65.5 in | Wt 254.6 lb

## 2019-02-23 DIAGNOSIS — R7309 Other abnormal glucose: Secondary | ICD-10-CM | POA: Diagnosis not present

## 2019-02-23 DIAGNOSIS — N183 Chronic kidney disease, stage 3 unspecified: Secondary | ICD-10-CM

## 2019-02-23 DIAGNOSIS — I5022 Chronic systolic (congestive) heart failure: Secondary | ICD-10-CM

## 2019-02-23 DIAGNOSIS — Z6841 Body Mass Index (BMI) 40.0 and over, adult: Secondary | ICD-10-CM | POA: Diagnosis not present

## 2019-02-23 DIAGNOSIS — I130 Hypertensive heart and chronic kidney disease with heart failure and stage 1 through stage 4 chronic kidney disease, or unspecified chronic kidney disease: Secondary | ICD-10-CM

## 2019-02-23 DIAGNOSIS — M1A30X0 Chronic gout due to renal impairment, unspecified site, without tophus (tophi): Secondary | ICD-10-CM

## 2019-02-23 NOTE — Patient Instructions (Signed)
DASH Eating Plan  DASH stands for "Dietary Approaches to Stop Hypertension." The DASH eating plan is a healthy eating plan that has been shown to reduce high blood pressure (hypertension). It may also reduce your risk for type 2 diabetes, heart disease, and stroke. The DASH eating plan may also help with weight loss.  What are tips for following this plan?    General guidelines   Avoid eating more than 2,300 mg (milligrams) of salt (sodium) a day. If you have hypertension, you may need to reduce your sodium intake to 1,500 mg a day.   Limit alcohol intake to no more than 1 drink a day for nonpregnant women and 2 drinks a day for men. One drink equals 12 oz of beer, 5 oz of wine, or 1 oz of hard liquor.   Work with your health care provider to maintain a healthy body weight or to lose weight. Ask what an ideal weight is for you.   Get at least 30 minutes of exercise that causes your heart to beat faster (aerobic exercise) most days of the week. Activities may include walking, swimming, or biking.   Work with your health care provider or diet and nutrition specialist (dietitian) to adjust your eating plan to your individual calorie needs.  Reading food labels     Check food labels for the amount of sodium per serving. Choose foods with less than 5 percent of the Daily Value of sodium. Generally, foods with less than 300 mg of sodium per serving fit into this eating plan.   To find whole grains, look for the word "whole" as the first word in the ingredient list.  Shopping   Buy products labeled as "low-sodium" or "no salt added."   Buy fresh foods. Avoid canned foods and premade or frozen meals.  Cooking   Avoid adding salt when cooking. Use salt-free seasonings or herbs instead of table salt or sea salt. Check with your health care provider or pharmacist before using salt substitutes.   Do not fry foods. Cook foods using healthy methods such as baking, boiling, grilling, and broiling instead.   Cook with  heart-healthy oils, such as olive, canola, soybean, or sunflower oil.  Meal planning   Eat a balanced diet that includes:  ? 5 or more servings of fruits and vegetables each day. At each meal, try to fill half of your plate with fruits and vegetables.  ? Up to 6-8 servings of whole grains each day.  ? Less than 6 oz of lean meat, poultry, or fish each day. A 3-oz serving of meat is about the same size as a deck of cards. One egg equals 1 oz.  ? 2 servings of low-fat dairy each day.  ? A serving of nuts, seeds, or beans 5 times each week.  ? Heart-healthy fats. Healthy fats called Omega-3 fatty acids are found in foods such as flaxseeds and coldwater fish, like sardines, salmon, and mackerel.   Limit how much you eat of the following:  ? Canned or prepackaged foods.  ? Food that is high in trans fat, such as fried foods.  ? Food that is high in saturated fat, such as fatty meat.  ? Sweets, desserts, sugary drinks, and other foods with added sugar.  ? Full-fat dairy products.   Do not salt foods before eating.   Try to eat at least 2 vegetarian meals each week.   Eat more home-cooked food and less restaurant, buffet, and fast food.     When eating at a restaurant, ask that your food be prepared with less salt or no salt, if possible.  What foods are recommended?  The items listed may not be a complete list. Talk with your dietitian about what dietary choices are best for you.  Grains  Whole-grain or whole-wheat bread. Whole-grain or whole-wheat pasta. Brown rice. Oatmeal. Quinoa. Bulgur. Whole-grain and low-sodium cereals. Pita bread. Low-fat, low-sodium crackers. Whole-wheat flour tortillas.  Vegetables  Fresh or frozen vegetables (raw, steamed, roasted, or grilled). Low-sodium or reduced-sodium tomato and vegetable juice. Low-sodium or reduced-sodium tomato sauce and tomato paste. Low-sodium or reduced-sodium canned vegetables.  Fruits  All fresh, dried, or frozen fruit. Canned fruit in natural juice (without  added sugar).  Meat and other protein foods  Skinless chicken or turkey. Ground chicken or turkey. Pork with fat trimmed off. Fish and seafood. Egg whites. Dried beans, peas, or lentils. Unsalted nuts, nut butters, and seeds. Unsalted canned beans. Lean cuts of beef with fat trimmed off. Low-sodium, lean deli meat.  Dairy  Low-fat (1%) or fat-free (skim) milk. Fat-free, low-fat, or reduced-fat cheeses. Nonfat, low-sodium ricotta or cottage cheese. Low-fat or nonfat yogurt. Low-fat, low-sodium cheese.  Fats and oils  Soft margarine without trans fats. Vegetable oil. Low-fat, reduced-fat, or light mayonnaise and salad dressings (reduced-sodium). Canola, safflower, olive, soybean, and sunflower oils. Avocado.  Seasoning and other foods  Herbs. Spices. Seasoning mixes without salt. Unsalted popcorn and pretzels. Fat-free sweets.  What foods are not recommended?  The items listed may not be a complete list. Talk with your dietitian about what dietary choices are best for you.  Grains  Baked goods made with fat, such as croissants, muffins, or some breads. Dry pasta or rice meal packs.  Vegetables  Creamed or fried vegetables. Vegetables in a cheese sauce. Regular canned vegetables (not low-sodium or reduced-sodium). Regular canned tomato sauce and paste (not low-sodium or reduced-sodium). Regular tomato and vegetable juice (not low-sodium or reduced-sodium). Pickles. Olives.  Fruits  Canned fruit in a light or heavy syrup. Fried fruit. Fruit in cream or butter sauce.  Meat and other protein foods  Fatty cuts of meat. Ribs. Fried meat. Bacon. Sausage. Bologna and other processed lunch meats. Salami. Fatback. Hotdogs. Bratwurst. Salted nuts and seeds. Canned beans with added salt. Canned or smoked fish. Whole eggs or egg yolks. Chicken or turkey with skin.  Dairy  Whole or 2% milk, cream, and half-and-half. Whole or full-fat cream cheese. Whole-fat or sweetened yogurt. Full-fat cheese. Nondairy creamers. Whipped toppings.  Processed cheese and cheese spreads.  Fats and oils  Butter. Stick margarine. Lard. Shortening. Ghee. Bacon fat. Tropical oils, such as coconut, palm kernel, or palm oil.  Seasoning and other foods  Salted popcorn and pretzels. Onion salt, garlic salt, seasoned salt, table salt, and sea salt. Worcestershire sauce. Tartar sauce. Barbecue sauce. Teriyaki sauce. Soy sauce, including reduced-sodium. Steak sauce. Canned and packaged gravies. Fish sauce. Oyster sauce. Cocktail sauce. Horseradish that you find on the shelf. Ketchup. Mustard. Meat flavorings and tenderizers. Bouillon cubes. Hot sauce and Tabasco sauce. Premade or packaged marinades. Premade or packaged taco seasonings. Relishes. Regular salad dressings.  Where to find more information:   National Heart, Lung, and Blood Institute: www.nhlbi.nih.gov   American Heart Association: www.heart.org  Summary   The DASH eating plan is a healthy eating plan that has been shown to reduce high blood pressure (hypertension). It may also reduce your risk for type 2 diabetes, heart disease, and stroke.   With the   DASH eating plan, you should limit salt (sodium) intake to 2,300 mg a day. If you have hypertension, you may need to reduce your sodium intake to 1,500 mg a day.   When on the DASH eating plan, aim to eat more fresh fruits and vegetables, whole grains, lean proteins, low-fat dairy, and heart-healthy fats.   Work with your health care provider or diet and nutrition specialist (dietitian) to adjust your eating plan to your individual calorie needs.  This information is not intended to replace advice given to you by your health care provider. Make sure you discuss any questions you have with your health care provider.  Document Released: 08/14/2011 Document Revised: 08/18/2016 Document Reviewed: 08/18/2016  [employer]vier Interactive Patient Education  2019 [employer]

## 2019-02-24 LAB — CMP14+EGFR
ALT: 33 IU/L (ref 0–44)
AST: 25 IU/L (ref 0–40)
Albumin/Globulin Ratio: 1.1 — ABNORMAL LOW (ref 1.2–2.2)
Albumin: 4.1 g/dL (ref 3.7–4.7)
Alkaline Phosphatase: 269 IU/L — ABNORMAL HIGH (ref 39–117)
BUN/Creatinine Ratio: 19 (ref 10–24)
BUN: 29 mg/dL — ABNORMAL HIGH (ref 8–27)
Bilirubin Total: 0.8 mg/dL (ref 0.0–1.2)
CO2: 25 mmol/L (ref 20–29)
Calcium: 9.4 mg/dL (ref 8.6–10.2)
Chloride: 98 mmol/L (ref 96–106)
Creatinine, Ser: 1.5 mg/dL — ABNORMAL HIGH (ref 0.76–1.27)
GFR calc Af Amer: 52 mL/min/{1.73_m2} — ABNORMAL LOW (ref >59)
GFR calc non Af Amer: 45 mL/min/{1.73_m2} — ABNORMAL LOW (ref >59)
Globulin, Total: 3.6 g/dL (ref 1.5–4.5)
Glucose: 119 mg/dL — ABNORMAL HIGH (ref 65–99)
Potassium: 4.3 mmol/L (ref 3.5–5.2)
Sodium: 136 mmol/L (ref 134–144)
Total Protein: 7.7 g/dL (ref 6.0–8.5)

## 2019-02-24 LAB — LIPID PANEL
Chol/HDL Ratio: 3.5 ratio (ref 0.0–5.0)
Cholesterol, Total: 213 mg/dL — ABNORMAL HIGH (ref 100–199)
HDL: 61 mg/dL (ref >39)
LDL Calculated: 138 mg/dL — ABNORMAL HIGH (ref 0–99)
Triglycerides: 70 mg/dL (ref 0–149)
VLDL Cholesterol Cal: 14 mg/dL (ref 5–40)

## 2019-02-24 LAB — URIC ACID: Uric Acid: 6.4 mg/dL (ref 3.7–8.6)

## 2019-02-24 LAB — HEMOGLOBIN A1C
Est. average glucose Bld gHb Est-mCnc: 131 mg/dL
Hgb A1c MFr Bld: 6.2 % — ABNORMAL HIGH (ref 4.8–5.6)

## 2019-02-26 NOTE — Progress Notes (Signed)
Subjective:     Patient ID: [name] , male    DOB: 23-Nov-1945 , 73 y.o.   MRN: [id number]   Chief Complaint  Patient presents with  . Hypertension    HPI  Hypertension This is a chronic problem. The current episode started more than 1 year ago. The problem has been gradually improving since onset. The problem is controlled. Pertinent negatives include no blurred vision, chest pain, headaches or shortness of breath. Risk factors for coronary artery disease include dyslipidemia, male gender and obesity. Compliance problems include diet.  Identifiable causes of hypertension include chronic renal disease.     Past Medical History:  Diagnosis Date  . Chronic renal insufficiency    WITH BASELINE CREATININE OF 1.6  . Chronic systolic CHF (congestive heart failure) ([place])   . Complete heart block (HCC)    s/p PPM by Dr Verlon Setting  . Coronary disease    NONOBSTRUCTIVE  . GERD (gastroesophageal reflux disease)   . Gout   . Hyperkalemia   . Hypertension   . Nonischemic cardiomyopathy ([place])   . Presence of permanent cardiac pacemaker      Family History  Problem Relation Age of Onset  . Diabetes Father      Current Outpatient Medications:  .  ADVAIR DISKUS 250-50 MCG/DOSE AEPB, INHALE 1 PUFF TWICE A DAY IN MORNING AND EVENING APPROXIMATELY 12 HOURS APART, Disp: 180 each, Rfl: 1 .  allopurinol (ZYLOPRIM) 100 MG tablet, TAKE 1 TABLET BY MOUTH EVERY DAY, Disp: 90 tablet, Rfl: 1 .  Ascorbic Acid (VITAMIN C) 1000 MG tablet, Take 2,000 mg by mouth daily. , Disp: , Rfl:  .  aspirin 81 MG tablet, Take 81 mg by mouth daily.  , Disp: , Rfl:  .  carvedilol (COREG) 12.5 MG tablet, Take 1 tablet (12.5 mg total) by mouth 2 (two) times daily., Disp: 180 tablet, Rfl: 3 .  Cholecalciferol (VITAMIN D-3) 1000 UNITS CAPS, Take 1,000 Units by mouth daily., Disp: , Rfl:  .  furosemide (LASIX) 40 MG tablet, TAKE 1 TABLET BY MOUTH EVERY DAY, Disp: 90 tablet, Rfl: 1 .  amLODipine (NORVASC) 5 MG tablet,  TAKE 1 TABLET BY MOUTH EVERY DAY, Disp: 90 tablet, Rfl: 2 .  Colchicine 0.6 MG CAPS, Take 1 tablet by mouth daily as needed (gout). , Disp: , Rfl:    Allergies  Allergen Reactions  . Avalide [Irbesartan-Hydrochlorothiazide] Anaphylaxis  . Penicillins Anaphylaxis  . Tinactin [Tolnaftate] Hives    Rash      Review of Systems  Constitutional: Negative.   Eyes: Negative for blurred vision.  Respiratory: Negative.  Negative for shortness of breath.   Cardiovascular: Negative.  Negative for chest pain.  Gastrointestinal: Negative.   Neurological: Negative.  Negative for headaches.  Psychiatric/Behavioral: Negative.      Today's Vitals   02/23/19 0938  BP: (!) 158/88  Pulse: 85  Temp: 98.3 F (36.8 C)  TempSrc: Oral  Weight: 254 lb 9.6 oz (115.5 kg)  Height: 5' 5.5" (1.664 m)  PainSc: 0-No pain   Body mass index is 41.72 kg/m.   Objective:  Physical Exam Vitals signs and nursing note reviewed.  Constitutional:      Appearance: Normal appearance.  Cardiovascular:     Rate and Rhythm: Normal rate and regular rhythm.     Heart sounds: Normal heart sounds.  Pulmonary:     Effort: Pulmonary effort is normal.     Breath sounds: Normal breath sounds.  Skin:    General:  Skin is warm.  Neurological:     General: No focal deficit present.     Mental Status: He is alert.  Psychiatric:        Mood and Affect: Mood normal.         Assessment And Plan:     1. 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 I mentioned need to adjust meds.  He does not wish to do so at this time. Importance of medication AND exercise AND dietary compliance was discussed with the patient.   - Lipid panel - CMP14+EGFR  2. Chronic renal disease, stage III (HCC)  Chronic. Importance of adequate hydration was stressed to the patient. He is encouraged to keep upcoming Renal appt.   3. Chronic systolic CHF (congestive heart failure) (HCC)  Chronic, yet stable.  Importance of dietary compliance was discussed with the patient.   4. Chronic gout due to renal impairment without tophus, unspecified site  Chronic. I will check uric acid level today.   5. Other abnormal glucose  HIS A1C HAS BEEN ELEVATED IN THE PAST. I WILL CHECK AN A1C, BMET TODAY. HE WAS ENCOURAGED TO AVOID SUGARY BEVERAGES AND PROCESSED FOODS INCLUDNG BREADS, RICE AND PASTA.  - Hemoglobin A1c - Uric acid  6. Class 3 severe obesity due to excess calories with serious comorbidity and body mass index (BMI) of 40.0 to 44.9 in adult [facility])  Importance of achieving optimal weight to decrease risk of cardiovascular disease and cancers was discussed with the patient in full detail. He is encouraged to start slowly - start with 10 minutes twice daily at least three to four days per week and to gradually build to 30 minutes five days weekly. He was given tips to incorporate more activity into her daily routine - take stairs when possible, park farther away from grocery stores, etc.    [name], MD    THE PATIENT IS ENCOURAGED TO PRACTICE SOCIAL DISTANCING DUE TO THE COVID-19 PANDEMIC.

## 2019-03-28 ENCOUNTER — Telehealth: Payer: Self-pay | Admitting: Cardiology

## 2019-03-28 NOTE — Telephone Encounter (Signed)
New Message     Patient yearly visit isn't due until 06/04/19 and tried to schedule patient but he would like to come in earlier.  Advised it would have to be a year so insurance will pay for it.  Patient doesn't have any issues going on right now either.  Patient would like to talk to a nurse about coming in earlier.

## 2019-03-28 NOTE — Telephone Encounter (Signed)
Spoke to patient follow up appointment scheduled with Dr.[name] 06/09/19 at 10:40 am.

## 2019-03-31 ENCOUNTER — Other Ambulatory Visit: Payer: Self-pay | Admitting: Internal Medicine

## 2019-04-28 ENCOUNTER — Other Ambulatory Visit: Payer: Self-pay | Admitting: Internal Medicine

## 2019-05-19 ENCOUNTER — Other Ambulatory Visit: Payer: Self-pay | Admitting: Internal Medicine

## 2019-05-23 ENCOUNTER — Other Ambulatory Visit: Payer: Self-pay | Admitting: *Deleted

## 2019-05-23 MED ORDER — CARVEDILOL 12.5 MG PO TABS: 12.5000 mg | ORAL_TABLET | Freq: Two times a day (BID) | ORAL | 3 refills | Status: AC

## 2019-06-06 NOTE — Progress Notes (Signed)
[name] Date of Birth: 04/06/1946   History of Present Illness: [name] is seen today for followup of congestive heart failure. He was last seen by me in September 2016. He has been noncompliant with medical therapy and follow up here and in the device clinic. He has a history of complete heart block and presented in September of 2010 with a heart rate of 10. He had a permanent pacemaker placed at that time. He did have acute renal failure and hyperkalemia at the time. He was on either an ACE inhibitor or ARB at that time. In November 2014 he presented with worsening CHF.  Echocardiogram showed marked LV dysfunction with ejection fraction of 20-25%. He had moderate pulmonary HTN. He was started on nitrates and hydralazine. He underwent right and left heart cath on 08/16/13 with moderate pulmonary HTN and elevated filling pressures. 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   He declined ICD. In June 2016 he was found to have lead fractures of atrial and ventricular leads and underwent revision of pacemaker leads and upgrade to CRT device. Repeat Echo in September 2019 showed persistent LV dysfunction with EF 25-30%.   On follow up today he states he feels great. No chest pain or dyspnea. No palpitations. Complains of swelling in his ankles which he attributes to amlodipine. Doesn't really know what his BP is.   Current Outpatient Medications on File Prior to Visit  Medication Sig Dispense Refill  . ADVAIR DISKUS 250-50 MCG/DOSE AEPB INHALE 1 PUFF TWICE A DAY IN MORNING AND EVENING APPROXIMATELY 12 HOURS APART 60 each 5  . allopurinol (ZYLOPRIM) 100 MG tablet TAKE 1 TABLET BY MOUTH EVERY DAY 90 tablet 1  . amLODipine (NORVASC) 5 MG tablet TAKE 1 TABLET BY MOUTH EVERY DAY 90 tablet 2  . Ascorbic Acid (VITAMIN C) 1000 MG tablet Take 2,000 mg by mouth daily.     Marland Kitchen aspirin 81 MG tablet Take 81 mg by mouth daily.      . carvedilol (COREG) 12.5 MG tablet Take 1 tablet (12.5 mg total)  by mouth 2 (two) times daily. 180 tablet 3  . Cholecalciferol (VITAMIN D-3) 1000 UNITS CAPS Take 1,000 Units by mouth daily.    . Colchicine 0.6 MG CAPS Take 1 tablet by mouth daily as needed (gout).     . furosemide (LASIX) 40 MG tablet TAKE 1 TABLET BY MOUTH EVERY DAY 90 tablet 1   No current facility-administered medications on file prior to visit.     Allergies  Allergen Reactions  . Avalide [Irbesartan-Hydrochlorothiazide] Anaphylaxis  . Penicillins Anaphylaxis  . Tinactin [Tolnaftate] Hives    Rash     Past Medical History:  Diagnosis Date  . Chronic renal insufficiency    WITH BASELINE CREATININE OF 1.6  . Chronic systolic CHF (congestive heart failure) (HCC)   . Complete heart block (HCC)    s/p PPM by Dr Reyes Ivan  . Coronary disease    NONOBSTRUCTIVE  . GERD (gastroesophageal reflux disease)   . Gout   . Hyperkalemia   . Hypertension   . Nonischemic cardiomyopathy (HCC)   . Presence of permanent cardiac pacemaker     Past Surgical History:  Procedure Laterality Date  . CARDIAC CATHETERIZATION  08/2013  . EP IMPLANTABLE DEVICE N/A 02/15/2015   Procedure: Lead Revision/Repair;  Surgeon: Hillis Range, MD;  Location: [facility] INVASIVE CV LAB;  Service: Cardiovascular;  Laterality: N/A;  . INGUINAL HERNIA REPAIR Bilateral   .  INSERT / REPLACE / REMOVE PACEMAKER  05/20/2009   MDT by Dr Verlon Setting for complete heart block  . LEFT AND RIGHT HEART CATHETERIZATION WITH CORONARY ANGIOGRAM N/A 08/16/2013   Procedure: LEFT AND RIGHT HEART CATHETERIZATION WITH CORONARY ANGIOGRAM;  Surgeon: Terrilee Dudzik M Martin[place], MD;  Location: [facility] CATH LAB;  Service: Cardiovascular;  Laterality: N/A;    Social History   Tobacco Use  Smoking Status Former Smoker  . Packs/day: 1.00  . Years: 15.00  . Pack years: 15.00  . Types: Cigarettes  Smokeless Tobacco Never Used  Tobacco Comment   "quit smoking cigarettes in 1985"    Social History   Substance and Sexual Activity  Alcohol Use Yes  .  Alcohol/week: 7.0 standard drinks  . Types: 7 Shots of liquor per week    Family History  Problem Relation Age of Onset  . Diabetes Father     Review of Systems:  As note in HPI. All other systems were reviewed and are negative.  Physical Exam: BP (!) 155/89   Pulse (!) 105   Temp 98.1 F (36.7 C)   Ht 5\' 9"  (1.753 m)   Wt 251 lb 6.4 oz (114 kg)   SpO2 96%   BMI 37.13 kg/m   GENERAL:  Well appearing overweight BM in NAD HEENT:  PERRL, EOMI, sclera are clear. Oropharynx is clear. NECK:  No jugular venous distention, carotid upstroke brisk and symmetric, no bruits, no thyromegaly or adenopathy LUNGS:  Clear to auscultation bilaterally CHEST:  Unremarkable HEART:  RRR,  PMI not displaced or sustained,S1 and S2 within normal limits, no S3, no S4: no clicks, no rubs, no murmurs ABD:  Soft, nontender. BS +, no masses or bruits. No hepatomegaly, no splenomegaly EXT:  2 + pulses throughout, no edema, no cyanosis no clubbing SKIN:  Warm and dry.  No rashes NEURO:  Alert and oriented x 3. Cranial nerves II through XII intact. PSYCH:  Cognitively intact    LABORATORY DATA: Lab Results  Component Value Date   WBC 5.5 08/10/2018   HGB 13.8 08/10/2018   HCT 40.9 08/10/2018   PLT 228 08/10/2018   GLUCOSE 119 (H) 02/23/2019   CHOL 213 (H) 02/23/2019   TRIG 70 02/23/2019   HDL 61 02/23/2019   LDLCALC 138 (H) 02/23/2019   ALT 33 02/23/2019   AST 25 02/23/2019   NA 136 02/23/2019   K 4.3 02/23/2019   CL 98 02/23/2019   CREATININE 1.50 (H) 02/23/2019   BUN 29 (H) 02/23/2019   CO2 25 02/23/2019   TSH 1.67 07/28/2013   INR 1.18 02/14/2015   HGBA1C 6.2 (H) 02/23/2019   MICROALBUR 30 08/10/2018    Dated 07/09/17: cholesterol 203, triglycerides 62, HDL 72, LDL 119.  Dated 01/06/18: creatinine 1.48. A1c 5.9%. Chemistries Normal   Ecg today shows atrial sensed and BiV pacing. Rate 70. I have personally reviewed and interpreted this study.  Echo 06/02/18: Study Conclusions  -  Left ventricle: The cavity size was moderately dilated. There was   moderate concentric hypertrophy. Systolic function was severely   reduced. The estimated ejection fraction was in the range of 25%   to 30%. Severe diffuse hypokinesis. There was an increased   relative contribution of atrial contraction to ventricular   filling. Doppler parameters are consistent with abnormal left   ventricular relaxation (grade 1 diastolic dysfunction). - Aortic valve: Trileaflet; moderately thickened, moderately   calcified leaflets. - Mitral valve: There was mild regurgitation. - Left atrium: The atrium was  mildly dilated. - Right ventricle: Pacer wire or catheter noted in right ventricle.   Systolic function was mildly to moderately reduced. - Right atrium: Pacer wire or catheter noted in right atrium. - Pulmonary arteries: Systolic pressure could not be accurately   estimated.  Assessment / Plan: 1. Chronic systolic congestive heart failure.  Ejection fraction of 25-30%. No improvement in Echo despite  CRT therapy.  Nonischemic.  He is intolerant to ACE inhibitors/ARBs related to hyperkalemia and acute renal failure. He is on Coreg today 12.5 mg bid. I recommend starting Bidil 20/37.5 mg tid. Once he starts this he can stop amlodipine.   2. CKD stage 3. Followed by Dr. Hyman Hopes- Nephrology. Creatinine stable 1.5.   3 .HTN- control is not optimal.  Resume Coreg continue lasix. Will switch amlodipine to Bidil 20/37.5 mg tid.   4. Complete heart block status post permanent pacemaker. Now with CRT device. Needs follow up in the device clinic.   I will plan on follow up in one year

## 2019-06-09 ENCOUNTER — Other Ambulatory Visit: Payer: Self-pay

## 2019-06-09 ENCOUNTER — Encounter: Payer: Self-pay | Admitting: Cardiology

## 2019-06-09 ENCOUNTER — Ambulatory Visit (INDEPENDENT_AMBULATORY_CARE_PROVIDER_SITE_OTHER): Payer: Medicare HMO | Admitting: Cardiology

## 2019-06-09 VITALS — BP 155/89 | HR 105 | Temp 98.1°F | Ht 69.0 in | Wt 251.4 lb

## 2019-06-09 DIAGNOSIS — I10 Essential (primary) hypertension: Secondary | ICD-10-CM

## 2019-06-09 DIAGNOSIS — Z950 Presence of cardiac pacemaker: Secondary | ICD-10-CM | POA: Diagnosis not present

## 2019-06-09 DIAGNOSIS — I5022 Chronic systolic (congestive) heart failure: Secondary | ICD-10-CM

## 2019-06-09 DIAGNOSIS — I442 Atrioventricular block, complete: Secondary | ICD-10-CM | POA: Diagnosis not present

## 2019-06-09 MED ORDER — BIDIL 20-37.5 MG PO TABS: 1.0000 | ORAL_TABLET | Freq: Three times a day (TID) | ORAL | 3 refills | Status: DC

## 2019-06-09 NOTE — Patient Instructions (Signed)
Medication Instructions:  Stop Amlodipine  Start Bidil 20/37.5 mg  1 tablet three times a day   Lab work: None ordered   Testing/Procedures: None ordered  Follow-Up: At [facility], you and your health needs are our priority.  As part of our continuing mission to provide you with exceptional heart care, we have created designated Provider Care Teams.  These Care Teams include your primary Cardiologist (physician) and Advanced Practice Providers (APPs -  Physician Assistants and Nurse Practitioners) who all work together to provide you with the care you need, when you need it. . Schedule follow up appointment in 1 year    Call in July to schedule a Oct appointment    Appointment scheduled with Oda Kilts PA Wed 06/22/19 at 8:50 am.

## 2019-06-21 NOTE — Progress Notes (Deleted)
Electrophysiology Office Note Date: 06/21/2019  ID:  [name], [name] 1945-11-03, MRN [id number]  PCP: Dorothyann Peng, MD Primary Cardiologist: No primary care provider on file. Electrophysiologist: Dr. Johney Frame  CC: Pacemaker follow-up  [name] is a 73 y.o. male with history of CHB w/ CRT-P, CRI (stage III) follows with nephrology Dr. Hyman Hopes, NICM, chronic CHF seen today for Dr. Johney Frame. he presents today for routine electrophysiology followup.  Since last being seen in our clinic, the patient reports doing very well.  he denies chest pain, palpitations, dyspnea, PND, orthopnea, nausea, vomiting, dizziness, syncope, edema, weight gain, or early satiety.  Device History: Medtronic BiV PPM originally implanted 2010, with RA/RV lead failure -> replacement and addition of LV lead in 02/15/2015 by Dr. Johney Frame  Past Medical History:  Diagnosis Date  . Chronic renal insufficiency    WITH BASELINE CREATININE OF 1.6  . Chronic systolic CHF (congestive heart failure) (HCC)   . Complete heart block (HCC)    s/p PPM by Dr Reyes Ivan  . Coronary disease    NONOBSTRUCTIVE  . GERD (gastroesophageal reflux disease)   . Gout   . Hyperkalemia   . Hypertension   . Nonischemic cardiomyopathy (HCC)   . Presence of permanent cardiac pacemaker    Past Surgical History:  Procedure Laterality Date  . CARDIAC CATHETERIZATION  08/2013  . EP IMPLANTABLE DEVICE N/A 02/15/2015   Procedure: Lead Revision/Repair;  Surgeon: Hillis Range, MD;  Location: [facility] INVASIVE CV LAB;  Service: Cardiovascular;  Laterality: N/A;  . INGUINAL HERNIA REPAIR Bilateral   . INSERT / REPLACE / REMOVE PACEMAKER  05/20/2009   MDT by Dr Reyes Ivan for complete heart block  . LEFT AND RIGHT HEART CATHETERIZATION WITH CORONARY ANGIOGRAM N/A 08/16/2013   Procedure: LEFT AND RIGHT HEART CATHETERIZATION WITH CORONARY ANGIOGRAM;  Surgeon: [name] Swaziland, MD;  Location: [facility] CATH LAB;  Service: Cardiovascular;  Laterality: N/A;    Current  Outpatient Medications  Medication Sig Dispense Refill  . ADVAIR DISKUS 250-50 MCG/DOSE AEPB INHALE 1 PUFF TWICE A DAY IN MORNING AND EVENING APPROXIMATELY 12 HOURS APART 60 each 5  . allopurinol (ZYLOPRIM) 100 MG tablet TAKE 1 TABLET BY MOUTH EVERY DAY 90 tablet 1  . amLODipine (NORVASC) 5 MG tablet TAKE 1 TABLET BY MOUTH EVERY DAY 90 tablet 2  . Ascorbic Acid (VITAMIN C) 1000 MG tablet Take 2,000 mg by mouth daily.     Marland Kitchen aspirin 81 MG tablet Take 81 mg by mouth daily.      . carvedilol (COREG) 12.5 MG tablet Take 1 tablet (12.5 mg total) by mouth 2 (two) times daily. 180 tablet 3  . Cholecalciferol (VITAMIN D-3) 1000 UNITS CAPS Take 1,000 Units by mouth daily.    . Colchicine 0.6 MG CAPS Take 1 tablet by mouth daily as needed (gout).     . furosemide (LASIX) 40 MG tablet TAKE 1 TABLET BY MOUTH EVERY DAY 90 tablet 1  . isosorbide-hydrALAZINE (BIDIL) 20-37.5 MG tablet Take 1 tablet by mouth 3 (three) times daily. 270 tablet 3   No current facility-administered medications for this visit.     Allergies:   Avalide [irbesartan-hydrochlorothiazide], Penicillins, and Tinactin [tolnaftate]   Social History: Social History   Socioeconomic History  . Marital status: Divorced    Spouse name: Not on file  . Number of children: 1  . Years of education: 39  . Highest education level: Not on file  Occupational History  . Occupation: Curator  Employer: [employer]  Social Needs  . Financial resource strain: Not on file  . Food insecurity    Worry: Not on file    Inability: Not on file  . Transportation needs    Medical: Not on file    Non-medical: Not on file  Tobacco Use  . Smoking status: Former Smoker    Packs/day: 1.00    Years: 15.00    Pack years: 15.00    Types: Cigarettes  . Smokeless tobacco: Never Used  . Tobacco comment: "quit smoking cigarettes in 1985"  Substance and Sexual Activity  . Alcohol use: Yes    Alcohol/week: 7.0 standard drinks    Types: 7 Shots of  liquor per week  . Drug use: No  . Sexual activity: Not Currently  Lifestyle  . Physical activity    Days per week: Not on file    Minutes per session: Not on file  . Stress: Not on file  Relationships  . Social Herbalist on phone: Not on file    Gets together: Not on file    Attends religious service: Not on file    Active member of club or organization: Not on file    Attends meetings of clubs or organizations: Not on file    Relationship status: Not on file  . Intimate partner violence    Fear of current or ex partner: Not on file    Emotionally abused: Not on file    Physically abused: Not on file    Forced sexual activity: Not on file  Other Topics Concern  . Not on file  Social History Narrative   Lives in [place].   Drinks a couple of coffees a day   Lives at home alone with his dog    Family History: Family History  Problem Relation Age of Onset  . Diabetes Father      Review of Systems: All other systems reviewed and are otherwise negative except as noted above.  Physical Exam: There were no vitals filed for this visit.   GEN- The patient is well appearing, alert and oriented x 3 today.   HEENT: normocephalic, atraumatic; sclera clear, conjunctiva pink; hearing intact; oropharynx clear; neck supple  Lungs- Clear to ausculation bilaterally, normal work of breathing.  No wheezes, rales, rhonchi Heart- Regular rate and rhythm, no murmurs, rubs or gallops  GI- soft, non-tender, non-distended, bowel sounds present  Extremities- no clubbing, cyanosis, or edema  MS- no significant deformity or atrophy Skin- warm and dry, no rash or lesion; PPM pocket well healed Psych- euthymic mood, full affect Neuro- strength and sensation are intact  PPM Interrogation- reviewed in detail today,  See PACEART report  EKG:  EKG {ACTION; IS/IS XBM:[id number]} ordered today. The ekg ordered today shows ***  Recent Labs: 08/10/2018: Hemoglobin 13.8; Platelets 228  02/23/2019: ALT 33; BUN 29; Creatinine, Ser 1.50; Potassium 4.3; Sodium 136   Wt Readings from Last 3 Encounters:  06/09/19 251 lb 6.4 oz (114 kg)  02/23/19 254 lb 9.6 oz (115.5 kg)  08/10/18 248 lb 9.6 oz (112.8 kg)     Other studies Reviewed: Additional studies/ records that were reviewed today include: Echo *** shows LVEF ***, Previous EP office notes, Previous remote checks, Most recent labwork.   Assessment and Plan:  1.  Symptomatic complete heart block s/p Medtronic PPM  Normal PPM function See Pace Art report No changes today  2. Chronic systolic dysfunction Echo 06/02/2018 EF 25-30% NYHA *** symptoms.  Continue coreg No ACE/ARB/ARNi with h/o Hyper-K No spiro with hyperK.  Continue bidil.   3. HTN Continue current regimen  Current medicines are reviewed at length with the patient today.   The patient {ACTIONS; HAS/DOES NOT HAVE:19233} concerns regarding his medicines.  The following changes were made today:  {NONE DEFAULTED:18576::"none"}  Labs/ tests ordered today include: *** No orders of the defined types were placed in this encounter.  Disposition:   Follow up with Dr. Johney Frame in 1 year.   Dustin Flock, PA-C  06/21/2019 3:43 PM  [facility] [address] [place] [address] [phone] (office) [phone] (fax)

## 2019-06-22 ENCOUNTER — Encounter: Payer: Medicare HMO | Admitting: Student

## 2019-06-29 NOTE — Progress Notes (Addendum)
Electrophysiology Office Note Date: 06/29/2019  ID:  Bridges, Gregory [birth year], MRN [id number]  PCP: Gregory Chard, MD Primary Cardiologist: No primary care provider on file. Electrophysiologist: Dr. Rayann Bridges  CC: Pacemaker follow-up  [name] is a 73 y.o. male seen today for Dr. Rayann Bridges. He presents today for routine electrophysiology followup.  Since last being seen in our clinic, the patient reports doing very well.  he denies chest pain, palpitations, dyspnea, PND, orthopnea, nausea, vomiting, dizziness, syncope, edema, weight gain, or early satiety.   He remains non-compliant with remote follow ups. Questionable compliance with medication. He took his medications this am, but has not yet picked up Bidil, ordered by Dr. Martinique on Oct 1. "Hasn't gotten around to it".  He has no complaints and would like to turn in his remote monitoring box if we "aren't going to use it".   Device History: Medtronic Dual Chamber PPM implanted 2010 , with RA and RV lead revision with upgrade to CRT 02/2015 for CHB and LBBB in chronic systolic CHF  Past Medical History:  Diagnosis Date  . Chronic renal insufficiency    WITH BASELINE CREATININE OF 1.6  . Chronic systolic CHF (congestive heart failure) ([place])   . Complete heart block (HCC)    s/p PPM by Dr [name] Setting  . Coronary disease    NONOBSTRUCTIVE  . GERD (gastroesophageal reflux disease)   . Gout   . Hyperkalemia   . Hypertension   . Nonischemic cardiomyopathy ([place])   . Presence of permanent cardiac pacemaker    Past Surgical History:  Procedure Laterality Date  . CARDIAC CATHETERIZATION  08/2013  . EP IMPLANTABLE DEVICE N/A 02/15/2015   Procedure: Lead Revision/Repair;  Surgeon: Thompson Grayer, MD;  Location: [facility];  Service: Cardiovascular;  Laterality: N/A;  . INGUINAL HERNIA REPAIR Bilateral   . INSERT / REPLACE / REMOVE PACEMAKER  05/20/2009   MDT by Dr [name] Setting for complete heart block  . LEFT AND RIGHT HEART  CATHETERIZATION WITH CORONARY ANGIOGRAM N/A 08/16/2013   Procedure: LEFT AND RIGHT HEART CATHETERIZATION WITH CORONARY ANGIOGRAM;  Surgeon: Gregory M Martinique, MD;  Location: [facility] CATH [facility];  Service: Cardiovascular;  Laterality: N/A;    Current Outpatient Medications  Medication Sig Dispense Refill  . ADVAIR DISKUS 250-50 MCG/DOSE AEPB INHALE 1 PUFF TWICE A DAY IN MORNING AND EVENING APPROXIMATELY 12 HOURS APART 60 each 5  . allopurinol (ZYLOPRIM) 100 MG tablet TAKE 1 TABLET BY MOUTH EVERY DAY 90 tablet 1  . amLODipine (NORVASC) 5 MG tablet TAKE 1 TABLET BY MOUTH EVERY DAY 90 tablet 2  . Ascorbic Acid (VITAMIN C) 1000 MG tablet Take 2,000 mg by mouth daily.     Marland Kitchen aspirin 81 MG tablet Take 81 mg by mouth daily.      . carvedilol (COREG) 12.5 MG tablet Take 1 tablet (12.5 mg total) by mouth 2 (two) times daily. 180 tablet 3  . Cholecalciferol (VITAMIN D-3) 1000 UNITS CAPS Take 1,000 Units by mouth daily.    . Colchicine 0.6 MG CAPS Take 1 tablet by mouth daily as needed (gout).     . furosemide (LASIX) 40 MG tablet TAKE 1 TABLET BY MOUTH EVERY DAY 90 tablet 1  . isosorbide-hydrALAZINE (BIDIL) 20-37.5 MG tablet Take 1 tablet by mouth 3 (three) times daily. 270 tablet 3   No current facility-administered medications for this visit.     Allergies:   Avalide [irbesartan-hydrochlorothiazide], Penicillins, and Tinactin [tolnaftate]   Social History: Social  History   Socioeconomic History  . Marital status: Divorced    Spouse name: Not on file  . Number of children: 1  . Years of education: 25  . Highest education level: Not on file  Occupational History  . Occupation: Sports administrator: Biomedical scientist  Social Needs  . Financial resource strain: Not on file  . Food insecurity    Worry: Not on file    Inability: Not on file  . Transportation needs    Medical: Not on file    Non-medical: Not on file  Tobacco Use  . Smoking status: Former Smoker    Packs/day: 1.00    Years: 15.00     Pack years: 15.00    Types: Cigarettes  . Smokeless tobacco: Never Used  . Tobacco comment: "quit smoking cigarettes in 1985"  Substance and Sexual Activity  . Alcohol use: Yes    Alcohol/week: 7.0 standard drinks    Types: 7 Shots of liquor per week  . Drug use: No  . Sexual activity: Not Currently  Lifestyle  . Physical activity    Days per week: Not on file    Minutes per session: Not on file  . Stress: Not on file  Relationships  . Social Musician on phone: Not on file    Gets together: Not on file    Attends religious service: Not on file    Active member of club or organization: Not on file    Attends meetings of clubs or organizations: Not on file    Relationship status: Not on file  . Intimate partner violence    Fear of current or ex partner: Not on file    Emotionally abused: Not on file    Physically abused: Not on file    Forced sexual activity: Not on file  Other Topics Concern  . Not on file  Social History Narrative   Lives in [place].   Drinks a couple of coffees a day   Lives at home alone with his dog    Family History: Family History  Problem Relation Age of Onset  . Diabetes Father      Review of Systems: All other systems reviewed and are otherwise negative except as noted above.  Physical Exam: Vitals:   06/30/19 1031  BP: (!) 162/89  Pulse: 78  Weight: 245 lb (111.1 kg)  Height: 5\' 9"  (1.753 m)     GEN- The patient is well appearing, alert and oriented x 3 today.   HEENT: normocephalic, atraumatic; sclera clear, conjunctiva pink; hearing intact; oropharynx clear; neck supple  Lungs- Clear to ausculation bilaterally, normal work of breathing.  No wheezes, rales, rhonchi Heart- Regular rate and rhythm, no murmurs, rubs or gallops  GI- soft, non-tender, non-distended, bowel sounds present  Extremities- no clubbing, cyanosis, or edema  MS- no significant deformity or atrophy Skin- warm and dry, no rash or lesion; PPM  pocket well healed Psych- euthymic mood, full affect Neuro- strength and sensation are intact  PPM Interrogation- reviewed in detail today,  See PACEART report  EKG:  EKG is not ordered today. The ekg ordered 06/09/2019 showed A sensed V paced rhythm at 70 bpm  Recent Labs: 08/10/2018: Hemoglobin 13.8; Platelets 228 02/23/2019: ALT 33; BUN 29; Creatinine, Ser 1.50; Potassium 4.3; Sodium 136   Wt Readings from Last 3 Encounters:  06/09/19 251 lb 6.4 oz (114 kg)  02/23/19 254 lb 9.6 oz (115.5 kg)  08/10/18 248 lb  9.6 oz (112.8 kg)     Other studies Reviewed: Additional studies/ records that were reviewed today include: Echo 05/2018 shows LVEF 25-30%, Previous EP office notes, Previous remote checks, Most recent labwork.   Assessment and Plan:  1.  Symptomatic bradycardia due to CHB s/p Medtronic PPM  Normal PPM function Numerous mode switches. Noise/oversensing consistent with previous check. Burden improved (From 0.4% to 0.3% with adjustment of sensitivity last session (2019)) Will not adjust further today as a sensitivity already < 1 at 0.9 mV, and hope to clarify further with remotes.   2. Chronic systolic CHF, NICM NYHA I-II symptoms by his report.  Echo 05/2018 LVEF 25-30% Volume status stable on lasix 40 mg daily Encouraged him to start on his Bidil 1 tab TID as ordered by Dr. Swaziland. Continue coreg 12.5 mg BID Not on ACE/ARB/aRNI with hyper K/ AKI.  Recent labs from Dr. Swaziland visit reviewed and stable.   3. HTN Continue amlodipine 5 mg daily. Encouraged Bidil as above.   Current medicines are reviewed at length with the patient today.   The patient does not have concerns regarding his medicines.  The following changes were made today:  none  Disposition:   Will plan yearly follow up, and try to enroll in remotes. Have scheduled a remote for next week, Wednesday, to test his equipment. If patient not compliant with remotes, will need follow up at least q 6 months in office.    Dustin Flock, PA-C  06/29/2019 11:57 AM  [facility] [address] [place] [address] [phone] (office) [phone] (fax)

## 2019-06-30 ENCOUNTER — Ambulatory Visit: Payer: Medicare HMO | Admitting: Student

## 2019-06-30 ENCOUNTER — Other Ambulatory Visit: Payer: Self-pay

## 2019-06-30 VITALS — BP 162/89 | HR 78 | Ht 69.0 in | Wt 245.0 lb

## 2019-06-30 DIAGNOSIS — I10 Essential (primary) hypertension: Secondary | ICD-10-CM

## 2019-06-30 DIAGNOSIS — I442 Atrioventricular block, complete: Secondary | ICD-10-CM

## 2019-06-30 DIAGNOSIS — I5022 Chronic systolic (congestive) heart failure: Secondary | ICD-10-CM | POA: Diagnosis not present

## 2019-06-30 LAB — CUP PACEART INCLINIC DEVICE CHECK
Battery Remaining Longevity: 44 mo
Battery Voltage: 2.98 V
Brady Statistic AP VP Percent: 4.37 %
Brady Statistic AP VS Percent: 0 %
Brady Statistic AS VP Percent: 95.58 %
Brady Statistic AS VS Percent: 0.05 %
Brady Statistic RA Percent Paced: 4.31 %
Brady Statistic RV Percent Paced: 99.89 %
Date Time Interrogation Session: 20201022144724
Implantable Lead Implant Date: 20160609
Implantable Lead Implant Date: 20160609
Implantable Lead Implant Date: 20160609
Implantable Lead Location: 753858
Implantable Lead Location: 753859
Implantable Lead Location: 753860
Implantable Lead Model: 5076
Implantable Lead Model: 5076
Implantable Pulse Generator Implant Date: 20160609
Lead Channel Impedance Value: 1026 Ohm
Lead Channel Impedance Value: 380 Ohm
Lead Channel Impedance Value: 475 Ohm
Lead Channel Impedance Value: 475 Ohm
Lead Channel Impedance Value: 551 Ohm
Lead Channel Impedance Value: 570 Ohm
Lead Channel Impedance Value: 665 Ohm
Lead Channel Impedance Value: 703 Ohm
Lead Channel Impedance Value: 798 Ohm
Lead Channel Pacing Threshold Amplitude: 1 V
Lead Channel Pacing Threshold Amplitude: 1 V
Lead Channel Pacing Threshold Amplitude: 2.125 V
Lead Channel Pacing Threshold Pulse Width: 0.4 ms
Lead Channel Pacing Threshold Pulse Width: 0.4 ms
Lead Channel Pacing Threshold Pulse Width: 0.4 ms
Lead Channel Sensing Intrinsic Amplitude: 14.25 mV
Lead Channel Sensing Intrinsic Amplitude: 14.25 mV
Lead Channel Sensing Intrinsic Amplitude: 2.625 mV
Lead Channel Sensing Intrinsic Amplitude: 3.625 mV
Lead Channel Setting Pacing Amplitude: 2 V
Lead Channel Setting Pacing Amplitude: 2.5 V
Lead Channel Setting Pacing Amplitude: 2.75 V
Lead Channel Setting Pacing Pulse Width: 0.4 ms
Lead Channel Setting Pacing Pulse Width: 0.4 ms
Lead Channel Setting Sensing Sensitivity: 8 mV

## 2019-07-06 ENCOUNTER — Encounter: Payer: Medicare HMO | Admitting: *Deleted

## 2019-08-17 ENCOUNTER — Other Ambulatory Visit: Payer: Self-pay

## 2019-08-17 ENCOUNTER — Ambulatory Visit (INDEPENDENT_AMBULATORY_CARE_PROVIDER_SITE_OTHER): Payer: Medicare HMO | Admitting: Internal Medicine

## 2019-08-17 ENCOUNTER — Ambulatory Visit (INDEPENDENT_AMBULATORY_CARE_PROVIDER_SITE_OTHER): Payer: Medicare HMO

## 2019-08-17 VITALS — BP 142/78 | HR 80 | Temp 98.7°F | Ht 66.6 in | Wt 259.6 lb

## 2019-08-17 DIAGNOSIS — R7309 Other abnormal glucose: Secondary | ICD-10-CM | POA: Diagnosis not present

## 2019-08-17 DIAGNOSIS — M1A3490 Chronic gout due to renal impairment, unspecified hand, without tophus (tophi): Secondary | ICD-10-CM | POA: Diagnosis not present

## 2019-08-17 DIAGNOSIS — I10 Essential (primary) hypertension: Secondary | ICD-10-CM

## 2019-08-17 DIAGNOSIS — R202 Paresthesia of skin: Secondary | ICD-10-CM

## 2019-08-17 DIAGNOSIS — N183 Chronic kidney disease, stage 3 unspecified: Secondary | ICD-10-CM

## 2019-08-17 DIAGNOSIS — R351 Nocturia: Secondary | ICD-10-CM

## 2019-08-17 DIAGNOSIS — I5022 Chronic systolic (congestive) heart failure: Secondary | ICD-10-CM

## 2019-08-17 DIAGNOSIS — M79609 Pain in unspecified limb: Secondary | ICD-10-CM | POA: Diagnosis not present

## 2019-08-17 DIAGNOSIS — I130 Hypertensive heart and chronic kidney disease with heart failure and stage 1 through stage 4 chronic kidney disease, or unspecified chronic kidney disease: Secondary | ICD-10-CM | POA: Diagnosis not present

## 2019-08-17 DIAGNOSIS — Z0000 Encounter for general adult medical examination without abnormal findings: Secondary | ICD-10-CM | POA: Diagnosis not present

## 2019-08-17 DIAGNOSIS — Z6841 Body Mass Index (BMI) 40.0 and over, adult: Secondary | ICD-10-CM | POA: Diagnosis not present

## 2019-08-17 LAB — POCT URINALYSIS DIPSTICK
Bilirubin, UA: NEGATIVE
Blood, UA: NEGATIVE
Glucose, UA: NEGATIVE
Ketones, UA: NEGATIVE
Nitrite, UA: NEGATIVE
Protein, UA: NEGATIVE
Spec Grav, UA: 1.02
Urobilinogen, UA: 1 U/dL
pH, UA: 6

## 2019-08-17 LAB — POCT UA - MICROALBUMIN
Albumin/Creatinine Ratio, Urine, POC: <30
Creatinine, POC: 200 mg/dL
Microalbumin Ur, POC: 30 mg/L

## 2019-08-17 NOTE — Progress Notes (Signed)
This visit occurred during the SARS-CoV-2 public health emergency.  Safety protocols were in place, including screening questions prior to the visit, additional usage of staff PPE, and extensive cleaning of exam room while observing appropriate contact time as indicated for disinfecting solutions.  Subjective:   [name] is a 73 y.o. male who presents for Medicare Annual/Subsequent preventive examination.  Review of Systems:  n/a Cardiac Risk Factors include: advanced age (>28men, >30 women);hypertension;male gender;obesity (BMI >30kg/m2)     Objective:    Vitals: BP (!) 142/78 (BP Location: Right Arm, Patient Position: Sitting, Cuff Size: Large)   Pulse 80   Temp 98.7 F (37.1 C) (Oral)   Ht 5' 6.6" (1.692 m)   Wt 259 lb 9.6 oz (117.8 kg)   SpO2 98%   BMI 41.15 kg/m   Body mass index is 41.15 kg/m.  Advanced Directives 08/17/2019 07/10/2017 02/14/2015 08/16/2013  Does Patient Have a Medical Advance Directive? No No No Patient does not have advance directive  Would patient like information on creating a medical advance directive? No - Patient declined No - Patient declined No - patient declined information -  Pre-existing out of facility DNR order (yellow form or pink MOST form) - - - No    Tobacco Social History   Tobacco Use  Smoking Status Former Smoker  . Packs/day: 1.00  . Years: 15.00  . Pack years: 15.00  . Types: Cigarettes  Smokeless Tobacco Never Used  Tobacco Comment   "quit smoking cigarettes in 1985"     Counseling given: Not Answered Comment: "quit smoking cigarettes in 1985"   Clinical Intake:  Pre-visit preparation completed: Yes  Pain : No/denies pain     Nutritional Status: BMI > 30  Obese Nutritional Risks: None Diabetes: No  How often do you need to have someone help you when you read instructions, pamphlets, or other written materials from your doctor or pharmacy?: 1 - Never What is the last grade level you completed in school?:  college  Interpreter Needed?: No  Information entered by :: NAllen LPN  Past Medical History:  Diagnosis Date  . Chronic renal insufficiency    WITH BASELINE CREATININE OF 1.6  . Chronic systolic CHF (congestive heart failure) (HCC)   . Complete heart block (HCC)    s/p PPM by Dr Reyes Ivan  . Coronary disease    NONOBSTRUCTIVE  . GERD (gastroesophageal reflux disease)   . Gout   . Hyperkalemia   . Hypertension   . Nonischemic cardiomyopathy (HCC)   . Presence of permanent cardiac pacemaker    Past Surgical History:  Procedure Laterality Date  . CARDIAC CATHETERIZATION  08/2013  . EP IMPLANTABLE DEVICE N/A 02/15/2015   Procedure: Lead Revision/Repair;  Surgeon: Hillis Range, MD;  Location: [facility];  Service: Cardiovascular;  Laterality: N/A;  . INGUINAL HERNIA REPAIR Bilateral   . INSERT / REPLACE / REMOVE PACEMAKER  05/20/2009   MDT by Dr Reyes Ivan for complete heart block  . LEFT AND RIGHT HEART CATHETERIZATION WITH CORONARY ANGIOGRAM N/A 08/16/2013   Procedure: LEFT AND RIGHT HEART CATHETERIZATION WITH CORONARY ANGIOGRAM;  Surgeon: [name] Swaziland, MD;  Location: [facility] CATH LAB;  Service: Cardiovascular;  Laterality: N/A;   Family History  Problem Relation Age of Onset  . Diabetes Father    Social History   Socioeconomic History  . Marital status: Divorced    Spouse name: Not on file  . Number of children: 1  . Years of education: 26  .  Highest education level: Not on file  Occupational History  . Occupation: Sports administrator: Biomedical scientist  . Occupation: retired  Engineer, production  . Financial resource strain: Not hard at all  . Food insecurity    Worry: Never true    Inability: Never true  . Transportation needs    Medical: No    Non-medical: No  Tobacco Use  . Smoking status: Former Smoker    Packs/day: 1.00    Years: 15.00    Pack years: 15.00    Types: Cigarettes  . Smokeless tobacco: Never Used  . Tobacco comment: "quit smoking cigarettes in 1985"   Substance and Sexual Activity  . Alcohol use: Yes    Alcohol/week: 7.0 standard drinks    Types: 7 Shots of liquor per week  . Drug use: No  . Sexual activity: Not Currently  Lifestyle  . Physical activity    Days per week: 3 days    Minutes per session: 10 min  . Stress: Not at all  Relationships  . Social Musician on phone: Not on file    Gets together: Not on file    Attends religious service: Not on file    Active member of club or organization: Not on file    Attends meetings of clubs or organizations: Not on file    Relationship status: Not on file  Other Topics Concern  . Not on file  Social History Narrative   Lives in [place].   Drinks a couple of coffees a day   Lives at home alone with his dog    Outpatient Encounter Medications as of 08/17/2019  Medication Sig  . ADVAIR DISKUS 250-50 MCG/DOSE AEPB INHALE 1 PUFF TWICE A DAY IN MORNING AND EVENING APPROXIMATELY 12 HOURS APART  . allopurinol (ZYLOPRIM) 100 MG tablet TAKE 1 TABLET BY MOUTH EVERY DAY  . Ascorbic Acid (VITAMIN C) 1000 MG tablet Take 2,000 mg by mouth daily.   Marland Kitchen aspirin 81 MG tablet Take 81 mg by mouth daily.    . carvedilol (COREG) 12.5 MG tablet Take 1 tablet (12.5 mg total) by mouth 2 (two) times daily.  . Cholecalciferol (VITAMIN D-3) 1000 UNITS CAPS Take 1,000 Units by mouth daily.  . Colchicine 0.6 MG CAPS Take 1 tablet by mouth daily as needed (gout).   . furosemide (LASIX) 40 MG tablet TAKE 1 TABLET BY MOUTH EVERY DAY  . isosorbide-hydrALAZINE (BIDIL) 20-37.5 MG tablet Take 1 tablet by mouth 3 (three) times daily.  Marland Kitchen amLODipine (NORVASC) 5 MG tablet TAKE 1 TABLET BY MOUTH EVERY DAY (Patient not taking: Reported on 08/17/2019)   No facility-administered encounter medications on file as of 08/17/2019.     Activities of Daily Living In your present state of health, do you have any difficulty performing the following activities: 08/17/2019  Hearing? Y  Comment bilateral hearing loss,  eligible for hearing aides  Vision? N  Difficulty concentrating or making decisions? N  Walking or climbing stairs? N  Dressing or bathing? N  Doing errands, shopping? N  Preparing Food and eating ? N  Using the Toilet? N  In the past six months, have you accidently leaked urine? N  Do you have problems with loss of bowel control? N  Managing your Medications? N  Managing your Finances? N  Housekeeping or managing your Housekeeping? N  Some recent data might be hidden    Patient Care Team: Dorothyann Peng, MD as PCP - General (Internal  Medicine)   Assessment:   This is a routine wellness examination for Bradlee.  Exercise Activities and Dietary recommendations Current Exercise Habits: Home exercise routine, Time (Minutes): 10, Frequency (Times/Week): 3, Weekly Exercise (Minutes/Week): 30  Goals    . DIET - REDUCE FAST FOOD INTAKE    . Patient Stated     08/17/2019, no goals       Fall Risk Fall Risk  08/17/2019 02/23/2019 08/10/2018  Falls in the past year? 0 0 0  Risk for fall due to : Medication side effect - -  Follow up Falls evaluation completed;Education provided;Falls prevention discussed - -   Is the patient's home free of loose throw rugs in walkways, pet beds, electrical cords, etc?   yes      Grab bars in the bathroom? no      Handrails on the stairs?   n/a      Adequate lighting?   yes  Timed Get Up and Go Performed: n/a  Depression Screen PHQ 2/9 Scores 08/17/2019 02/23/2019 08/10/2018  PHQ - 2 Score 0 3 0  PHQ- 9 Score 0 6 -    Cognitive Function     6CIT Screen 08/17/2019  What Year? 0 points  What month? 0 points  What time? 0 points  Count back from 20 0 points  Months in reverse 4 points  Repeat phrase 0 points  Total Score 4     There is no immunization history on file for this patient.  Qualifies for Shingles Vaccine? yes  Screening Tests Health Maintenance  Topic Date Due  . INFLUENZA VACCINE  12/07/2019 (Originally 04/09/2019)  .  COLONOSCOPY  08/16/2020 (Originally 01/24/1994)  . TETANUS/TDAP  08/16/2020 (Originally 01/25/1963)  . PNA vac Low Risk Adult (1 of 2 - PCV13) 08/16/2020 (Originally 01/24/2009)  . Hepatitis C Screening  Completed   Cancer Screenings: Lung: Low Dose CT Chest recommended if Age 21-80 years, 30 pack-year currently smoking OR have quit w/in 15years. Patient does not qualify. Colorectal: working through [facility]:  Hepatitis C Screening:10/05/2014      Plan:    Patient has no goals set at this time.  I have personally reviewed and noted the following in the patient's chart:   . Medical and social history . Use of alcohol, tobacco or illicit drugs  . Current medications and supplements . Functional ability and status . Nutritional status . Physical activity . Advanced directives . List of other physicians . Hospitalizations, surgeries, and ER visits in previous 12 months . Vitals . Screenings to include cognitive, depression, and falls . Referrals and appointments  In addition, I have reviewed and discussed with patient certain preventive protocols, quality metrics, and best practice recommendations. A written personalized care plan for preventive services as well as general preventive health recommendations were provided to patient.     Kellie Simmering, LPN  02/09/159

## 2019-08-17 NOTE — Patient Instructions (Signed)
[name] , Thank you for taking time to come for your Medicare Wellness Visit. I appreciate your ongoing commitment to your health goals. Please review the following plan we discussed and let me know if I can assist you in the future.   Screening recommendations/referrals: Colonoscopy: working on through [place] Recommended yearly ophthalmology/optometry visit for glaucoma screening and checkup Recommended yearly dental visit for hygiene and checkup  Vaccinations: Influenza vaccine: declines Pneumococcal vaccine: declines Tdap vaccine: declines Shingles vaccine: declines    Advanced directives: Advance directive discussed with you today. Even though you declined this today please call our office should you change your mind and we can give you the proper paperwork for you to fill out.   Conditions/risks identified: obesity  Next appointment:   Preventive Care 5 Years and Older, Male Preventive care refers to lifestyle choices and visits with your health care provider that can promote health and wellness. What does preventive care include?  A yearly physical exam. This is also called an annual well check.  Dental exams once or twice a year.  Routine eye exams. Ask your health care provider how often you should have your eyes checked.  Personal lifestyle choices, including:  Daily care of your teeth and gums.  Regular physical activity.  Eating a healthy diet.  Avoiding tobacco and drug use.  Limiting alcohol use.  Practicing safe sex.  Taking low doses of aspirin every day.  Taking vitamin and mineral supplements as recommended by your health care provider. What happens during an annual well check? The services and screenings done by your health care provider during your annual well check will depend on your age, overall health, lifestyle risk factors, and family history of disease. Counseling  Your health care provider may ask you questions about your:  Alcohol use.   Tobacco use.  Drug use.  Emotional well-being.  Home and relationship well-being.  Sexual activity.  Eating habits.  History of falls.  Memory and ability to understand (cognition).  Work and work Statistician. Screening  You may have the following tests or measurements:  Height, weight, and BMI.  Blood pressure.  Lipid and cholesterol levels. These may be checked every 5 years, or more frequently if you are over 66 years old.  Skin check.  Lung cancer screening. You may have this screening every year starting at age 6 if you have a 30-pack-year history of smoking and currently smoke or have quit within the past 15 years.  Fecal occult blood test (FOBT) of the stool. You may have this test every year starting at age 79.  Flexible sigmoidoscopy or colonoscopy. You may have a sigmoidoscopy every 5 years or a colonoscopy every 10 years starting at age 73.  Prostate cancer screening. Recommendations will vary depending on your family history and other risks.  Hepatitis C blood test.  Hepatitis B blood test.  Sexually transmitted disease (STD) testing.  Diabetes screening. This is done by checking your blood sugar (glucose) after you have not eaten for a while (fasting). You may have this done every 1-3 years.  Abdominal aortic aneurysm (AAA) screening. You may need this if you are a current or former smoker.  Osteoporosis. You may be screened starting at age 32 if you are at high risk. Talk with your health care provider about your test results, treatment options, and if necessary, the need for more tests. Vaccines  Your health care provider may recommend certain vaccines, such as:  Influenza vaccine. This is recommended every  year.  Tetanus, diphtheria, and acellular pertussis (Tdap, Td) vaccine. You may need a Td booster every 10 years.  Zoster vaccine. You may need this after age 10.  Pneumococcal 13-valent conjugate (PCV13) vaccine. One dose is recommended  after age 61.  Pneumococcal polysaccharide (PPSV23) vaccine. One dose is recommended after age 53. Talk to your health care provider about which screenings and vaccines you need and how often you need them. This information is not intended to replace advice given to you by your health care provider. Make sure you discuss any questions you have with your health care provider. Document Released: 09/21/2015 Document Revised: 05/14/2016 Document Reviewed: 06/26/2015 Elsevier Interactive Patient Education  2017 [place] Prevention in the Home Falls can cause injuries. They can happen to people of all ages. There are many things you can do to make your home safe and to help prevent falls. What can I do on the outside of my home?  Regularly fix the edges of walkways and driveways and fix any cracks.  Remove anything that might make you trip as you walk through a door, such as a raised step or threshold.  Trim any bushes or trees on the path to your home.  Use bright outdoor lighting.  Clear any walking paths of anything that might make someone trip, such as rocks or tools.  Regularly check to see if handrails are loose or broken. Make sure that both sides of any steps have handrails.  Any raised decks and porches should have guardrails on the edges.  Have any leaves, snow, or ice cleared regularly.  Use sand or salt on walking paths during winter.  Clean up any spills in your garage right away. This includes oil or grease spills. What can I do in the bathroom?  Use night lights.  Install grab bars by the toilet and in the tub and shower. Do not use towel bars as grab bars.  Use non-skid mats or decals in the tub or shower.  If you need to sit down in the shower, use a plastic, non-slip stool.  Keep the floor dry. Clean up any water that spills on the floor as soon as it happens.  Remove soap buildup in the tub or shower regularly.  Attach bath mats securely with  double-sided non-slip rug tape.  Do not have throw rugs and other things on the floor that can make you trip. What can I do in the bedroom?  Use night lights.  Make sure that you have a light by your bed that is easy to reach.  Do not use any sheets or blankets that are too big for your bed. They should not hang down onto the floor.  Have a firm chair that has side arms. You can use this for support while you get dressed.  Do not have throw rugs and other things on the floor that can make you trip. What can I do in the kitchen?  Clean up any spills right away.  Avoid walking on wet floors.  Keep items that you use a lot in easy-to-reach places.  If you need to reach something above you, use a strong step stool that has a grab bar.  Keep electrical cords out of the way.  Do not use floor polish or wax that makes floors slippery. If you must use wax, use non-skid floor wax.  Do not have throw rugs and other things on the floor that can make you trip. What can  I do with my stairs?  Do not leave any items on the stairs.  Make sure that there are handrails on both sides of the stairs and use them. Fix handrails that are broken or loose. Make sure that handrails are as long as the stairways.  Check any carpeting to make sure that it is firmly attached to the stairs. Fix any carpet that is loose or worn.  Avoid having throw rugs at the top or bottom of the stairs. If you do have throw rugs, attach them to the floor with carpet tape.  Make sure that you have a light switch at the top of the stairs and the bottom of the stairs. If you do not have them, ask someone to add them for you. What else can I do to help prevent falls?  Wear shoes that:  Do not have high heels.  Have rubber bottoms.  Are comfortable and fit you well.  Are closed at the toe. Do not wear sandals.  If you use a stepladder:  Make sure that it is fully opened. Do not climb a closed stepladder.  Make  sure that both sides of the stepladder are locked into place.  Ask someone to hold it for you, if possible.  Clearly mark and make sure that you can see:  Any grab bars or handrails.  First and last steps.  Where the edge of each step is.  Use tools that help you move around (mobility aids) if they are needed. These include:  Canes.  Walkers.  Scooters.  Crutches.  Turn on the lights when you go into a dark area. Replace any light bulbs as soon as they burn out.  Set up your furniture so you have a clear path. Avoid moving your furniture around.  If any of your floors are uneven, fix them.  If there are any pets around you, be aware of where they are.  Review your medicines with your doctor. Some medicines can make you feel dizzy. This can increase your chance of falling. Ask your doctor what other things that you can do to help prevent falls. This information is not intended to replace advice given to you by your health care provider. Make sure you discuss any questions you have with your health care provider. Document Released: 06/21/2009 Document Revised: 01/31/2016 Document Reviewed: 09/29/2014 Elsevier Interactive Patient Education  2017 [employer].

## 2019-08-17 NOTE — Progress Notes (Signed)
This visit occurred during the SARS-CoV-2 public health emergency.  Safety protocols were in place, including screening questions prior to the visit, additional usage of staff PPE, and extensive cleaning of exam room while observing appropriate contact time as indicated for disinfecting solutions.  Subjective:     Patient ID: [name] , male    DOB: 04-Dec-1945 , 73 y.o.   MRN: [id number]   Chief Complaint  Patient presents with  . Annual Exam  . Hypertension    HPI  He is here today for a full physical examination. He has no specific concerns or complaints at this time.   Hypertension This is a chronic problem. The current episode started more than 1 year ago. The problem has been gradually improving since onset. The problem is uncontrolled. Pertinent negatives include no blurred vision, chest pain, palpitations or shortness of breath. Risk factors for coronary artery disease include dyslipidemia, obesity and sedentary lifestyle. The current treatment provides moderate improvement. Compliance problems include exercise.  Hypertensive end-organ damage includes kidney disease.     Past Medical History:  Diagnosis Date  . Chronic renal insufficiency    WITH BASELINE CREATININE OF 1.6  . Chronic systolic CHF (congestive heart failure) ([place])   . Complete heart block (HCC)    s/p PPM by Dr Verlon Setting  . Coronary disease    NONOBSTRUCTIVE  . GERD (gastroesophageal reflux disease)   . Gout   . Hyperkalemia   . Hypertension   . Nonischemic cardiomyopathy ([place])   . Presence of permanent cardiac pacemaker      Family History  Problem Relation Age of Onset  . Diabetes Father      Current Outpatient Medications:  .  ADVAIR DISKUS 250-50 MCG/DOSE AEPB, INHALE 1 PUFF TWICE A DAY IN MORNING AND EVENING APPROXIMATELY 12 HOURS APART, Disp: 60 each, Rfl: 5 .  allopurinol (ZYLOPRIM) 100 MG tablet, TAKE 1 TABLET BY MOUTH EVERY DAY, Disp: 90 tablet, Rfl: 1 .  amLODipine (NORVASC) 5 MG  tablet, TAKE 1 TABLET BY MOUTH EVERY DAY (Patient not taking: Reported on 08/17/2019), Disp: 90 tablet, Rfl: 2 .  Ascorbic Acid (VITAMIN C) 1000 MG tablet, Take 2,000 mg by mouth daily. , Disp: , Rfl:  .  aspirin 81 MG tablet, Take 81 mg by mouth daily.  , Disp: , Rfl:  .  carvedilol (COREG) 12.5 MG tablet, Take 1 tablet (12.5 mg total) by mouth 2 (two) times daily., Disp: 180 tablet, Rfl: 3 .  Cholecalciferol (VITAMIN D-3) 1000 UNITS CAPS, Take 1,000 Units by mouth daily., Disp: , Rfl:  .  Colchicine 0.6 MG CAPS, Take 1 tablet by mouth daily as needed (gout). , Disp: , Rfl:  .  furosemide (LASIX) 40 MG tablet, TAKE 1 TABLET BY MOUTH EVERY DAY, Disp: 90 tablet, Rfl: 1 .  isosorbide-hydrALAZINE (BIDIL) 20-37.5 MG tablet, Take 1 tablet by mouth 3 (three) times daily., Disp: 270 tablet, Rfl: 3   Allergies  Allergen Reactions  . Avalide [Irbesartan-Hydrochlorothiazide] Anaphylaxis  . Penicillins Anaphylaxis  . Tinactin [Tolnaftate] Hives    Rash      Men's preventive visit. Patient Health Questionnaire (PHQ-2) is    Clinical Support from 08/17/2019 in [facility]  PHQ-2 Total Score  0    . Patient is on a healthy diet. Marital status: Divorced. Relevant history for alcohol use is:  Social History   Substance and Sexual Activity  Alcohol Use Yes  . Alcohol/week: 7.0 standard drinks  . Types: 7 Shots  of liquor per week  . Relevant history for tobacco use is:  Social History   Tobacco Use  Smoking Status Former Smoker  . Packs/day: 1.00  . Years: 15.00  . Pack years: 15.00  . Types: Cigarettes  Smokeless Tobacco Never Used  Tobacco Comment   "quit smoking cigarettes in 1985"  .  Review of Systems  Constitutional: Negative.   HENT: Negative.   Eyes: Negative.  Negative for blurred vision.  Respiratory: Negative.  Negative for shortness of breath.   Cardiovascular: Negative.  Negative for chest pain and palpitations.  Endocrine: Negative.   Genitourinary:  Positive for frequency.       States he has urinary frequency at night.   Musculoskeletal: Negative.   Skin: Negative.   Allergic/Immunologic: Negative.   Neurological: Positive for numbness.       He c/o numbness of RUE. Unable to determine what triggers his sx. Denies RUE weakness. Denies neck pain.   Hematological: Negative.   Psychiatric/Behavioral: Negative.      Today's Vitals   08/17/19 0953  BP: (!) 142/78  Pulse: 80  Temp: 98.7 F (37.1 C)  TempSrc: Oral  Weight: 259 lb 9.6 oz (117.8 kg)  Height: 5' 6.6" (1.692 m)   Body mass index is 41.15 kg/m.   Objective:  Physical Exam Vitals and nursing note reviewed.  Constitutional:      Appearance: Normal appearance. He is obese.  HENT:     Head: Normocephalic and atraumatic.     Right Ear: Tympanic membrane, ear canal and external ear normal.     Left Ear: Tympanic membrane, ear canal and external ear normal.     Nose: Nose normal.     Mouth/Throat:     Mouth: Mucous membranes are moist.     Pharynx: Oropharynx is clear.  Eyes:     Extraocular Movements: Extraocular movements intact.     Conjunctiva/sclera: Conjunctivae normal.     Pupils: Pupils are equal, round, and reactive to light.  Cardiovascular:     Rate and Rhythm: Normal rate and regular rhythm.     Pulses: Normal pulses.     Heart sounds: Normal heart sounds.  Pulmonary:     Effort: Pulmonary effort is normal.     Breath sounds: Normal breath sounds.  Chest:     Breasts:        Right: Normal. No swelling, bleeding, inverted nipple, mass or nipple discharge.        Left: Normal. No swelling, bleeding, inverted nipple, mass or nipple discharge.  Abdominal:     General: Bowel sounds are normal.     Palpations: Abdomen is soft.     Comments: Rounded, soft.   Genitourinary:    Comments: Deferred, as per patient.  Musculoskeletal:        General: Normal range of motion.     Cervical back: Normal range of motion and neck supple.     Comments: He has  right hand joint swelling. No overlying erythema.   Skin:    General: Skin is warm.  Neurological:     General: No focal deficit present.     Mental Status: He is alert.  Psychiatric:        Mood and Affect: Mood normal.        Behavior: Behavior normal.         Assessment And Plan:     1. Routine general medical examination at health care facility  A full exam was performed.  DRE  deferred, as per patient's request. PATIENT HAS BEEN ADVISED TO GET 30-45 MINUTES REGULAR EXERCISE NO LESS THAN FOUR TO FIVE DAYS PER WEEK - BOTH WEIGHTBEARING EXERCISES AND AEROBIC ARE RECOMMENDED.  HE iS ADVISED TO FOLLOW A HEALTHY DIET WITH AT LEAST SIX FRUITS/VEGGIES PER DAY, DECREASE INTAKE OF RED MEAT, AND TO INCREASE FISH INTAKE TO TWO DAYS PER WEEK.  MEATS/FISH SHOULD NOT BE FRIED, BAKED OR BROILED IS PREFERABLE.  I SUGGEST WEARING SPF 50 SUNSCREEN ON EXPOSED PARTS AND ESPECIALLY WHEN IN THE DIRECT SUNLIGHT FOR AN EXTENDED PERIOD OF TIME.  PLEASE AVOID FAST FOOD RESTAURANTS AND INCREASE YOUR WATER INTAKE.   2. 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 He will continue with current meds. He was also advised to start Bidil as Cardiology suggested at previous visits. He was given samples, advised to start with 1/2 tab twice daily x 2 weeks, then increase to one tab twice daily. Possible side effects including headache was discussed with the patient. He is encouraged to avoid adding salt to his foods. EKG not performed, he recently had one performed October 2020. This was reviewed during his visit. He will rto in six months for re-evaluation. Importance of medication and dietary compliance was discussed with the patient.   - CMP14+EGFR - CBC - Lipid panel  3. Stage 3 chronic kidney disease, unspecified whether stage 3a or 3b CKD  Chronic, he is also followed by Nephrology. Importance of maintenance of optimal bp control to prevent progression of CKD was discussed  with the patient.   4. Chronic systolic CHF (congestive heart failure) (HCC)  Chronic, yet stable. Again, importance of dietary compliance was discussed with the patient. He is also followed by Cardiology.  5. Other abnormal glucose  HIS A1C HAS BEEN ELEVATED IN THE PAST. I WILL CHECK AN A1C, BMET TODAY. HE WAS ENCOURAGED TO AVOID SUGARY BEVERAGES AND PROCESSED FOODS INCLUDNG BREADS, RICE AND PASTA.  - Hemoglobin A1c  6. 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 I will check uric acid level. He is encouraged to avoid foods known to trigger his sx.   - Uric acid  7. Nocturia  I will check labs as listed below. He declined DRE.  - PSA, total and free  8. Paresthesia and pain of right extremity  He declined nerve conduction study at this time. He will let me know if his sx persist.   9. Class 3 severe obesity due to excess calories with serious comorbidity and body mass index (BMI) of 40.0 to 44.9 in adult [facility])  Importance of achieving optimal weight to decrease risk of cardiovascular disease and cancers was discussed with the patient in full detail. He is encouraged to start slowly - start with 10 minutes twice daily at least three to four days per week and to gradually build to 30 minutes five days weekly. He was given tips to incorporate more activity into his daily routine - take stairs when possible, park farther away from grocery stores, etc.     [name], MD    THE PATIENT IS ENCOURAGED TO PRACTICE SOCIAL DISTANCING DUE TO THE COVID-19 PANDEMIC.

## 2019-08-17 NOTE — Addendum Note (Signed)
Addended by: Glenna Durand E on: 08/17/2019 12:47 PM   Modules accepted: Orders

## 2019-08-18 LAB — HEMOGLOBIN A1C
Est. average glucose Bld gHb Est-mCnc: 126 mg/dL
Hgb A1c MFr Bld: 6 % — ABNORMAL HIGH (ref 4.8–5.6)

## 2019-08-18 LAB — CMP14+EGFR
ALT: 25 IU/L (ref 0–44)
AST: 25 IU/L (ref 0–40)
Albumin/Globulin Ratio: 1.2 (ref 1.2–2.2)
Albumin: 3.9 g/dL (ref 3.7–4.7)
Alkaline Phosphatase: 241 IU/L — ABNORMAL HIGH (ref 39–117)
BUN/Creatinine Ratio: 20 (ref 10–24)
BUN: 35 mg/dL — ABNORMAL HIGH (ref 8–27)
Bilirubin Total: 0.8 mg/dL (ref 0.0–1.2)
CO2: 24 mmol/L (ref 20–29)
Calcium: 8.7 mg/dL (ref 8.6–10.2)
Chloride: 105 mmol/L (ref 96–106)
Creatinine, Ser: 1.71 mg/dL — ABNORMAL HIGH (ref 0.76–1.27)
GFR calc Af Amer: 44 mL/min/{1.73_m2} — ABNORMAL LOW (ref >59)
GFR calc non Af Amer: 38 mL/min/{1.73_m2} — ABNORMAL LOW (ref >59)
Globulin, Total: 3.2 g/dL (ref 1.5–4.5)
Glucose: 103 mg/dL — ABNORMAL HIGH (ref 65–99)
Potassium: 5 mmol/L (ref 3.5–5.2)
Sodium: 141 mmol/L (ref 134–144)
Total Protein: 7.1 g/dL (ref 6.0–8.5)

## 2019-08-18 LAB — CBC
Hematocrit: 37.8 % (ref 37.5–51.0)
Hemoglobin: 12.3 g/dL — ABNORMAL LOW (ref 13.0–17.7)
MCH: 30.6 pg (ref 26.6–33.0)
MCHC: 32.5 g/dL (ref 31.5–35.7)
MCV: 94 fL (ref 79–97)
Platelets: 171 10*3/uL (ref 150–450)
RBC: 4.02 x10E6/uL — ABNORMAL LOW (ref 4.14–5.80)
RDW: 13.3 % (ref 11.6–15.4)
WBC: 6.1 10*3/uL (ref 3.4–10.8)

## 2019-08-18 LAB — PSA, TOTAL AND FREE
PSA, Free Pct: 43 %
PSA, Free: 0.43 ng/mL
Prostate Specific Ag, Serum: 1 ng/mL (ref 0.0–4.0)

## 2019-08-18 LAB — LIPID PANEL
Chol/HDL Ratio: 2.5 ratio (ref 0.0–5.0)
Cholesterol, Total: 174 mg/dL (ref 100–199)
HDL: 71 mg/dL (ref >39)
LDL Chol Calc (NIH): 91 mg/dL (ref 0–99)
Triglycerides: 64 mg/dL (ref 0–149)
VLDL Cholesterol Cal: 12 mg/dL (ref 5–40)

## 2019-08-23 DIAGNOSIS — R69 Illness, unspecified: Secondary | ICD-10-CM | POA: Diagnosis not present

## 2019-09-05 DIAGNOSIS — R69 Illness, unspecified: Secondary | ICD-10-CM | POA: Diagnosis not present

## 2019-09-06 ENCOUNTER — Encounter: Payer: Self-pay | Admitting: Internal Medicine

## 2019-09-06 NOTE — Patient Instructions (Signed)

## 2019-09-11 ENCOUNTER — Emergency Department (HOSPITAL_COMMUNITY)
Admission: EM | Admit: 2019-09-11 | Discharge: 2019-09-12 | Disposition: A | Discharge status: Home / Self Care | Payer: No Typology Code available for payment source | Attending: Emergency Medicine | Admitting: Emergency Medicine

## 2019-09-11 ENCOUNTER — Encounter (HOSPITAL_COMMUNITY): Payer: Self-pay | Admitting: Emergency Medicine

## 2019-09-11 ENCOUNTER — Emergency Department (HOSPITAL_COMMUNITY): Admission: EM | Admit: 2019-09-11 | Discharge: 2019-09-11 | Discharge status: Absent without leave | Payer: Medicare HMO

## 2019-09-11 ENCOUNTER — Emergency Department (HOSPITAL_COMMUNITY): Payer: No Typology Code available for payment source

## 2019-09-11 ENCOUNTER — Other Ambulatory Visit: Payer: Self-pay

## 2019-09-11 DIAGNOSIS — Z79899 Other long term (current) drug therapy: Secondary | ICD-10-CM | POA: Diagnosis not present

## 2019-09-11 DIAGNOSIS — R079 Chest pain, unspecified: Secondary | ICD-10-CM | POA: Insufficient documentation

## 2019-09-11 DIAGNOSIS — Y999 Unspecified external cause status: Secondary | ICD-10-CM | POA: Diagnosis not present

## 2019-09-11 DIAGNOSIS — Y939 Activity, unspecified: Secondary | ICD-10-CM | POA: Diagnosis not present

## 2019-09-11 DIAGNOSIS — I130 Hypertensive heart and chronic kidney disease with heart failure and stage 1 through stage 4 chronic kidney disease, or unspecified chronic kidney disease: Secondary | ICD-10-CM | POA: Insufficient documentation

## 2019-09-11 DIAGNOSIS — N183 Chronic kidney disease, stage 3 unspecified: Secondary | ICD-10-CM | POA: Insufficient documentation

## 2019-09-11 DIAGNOSIS — Z950 Presence of cardiac pacemaker: Secondary | ICD-10-CM | POA: Insufficient documentation

## 2019-09-11 DIAGNOSIS — K5730 Diverticulosis of large intestine without perforation or abscess without bleeding: Secondary | ICD-10-CM | POA: Diagnosis not present

## 2019-09-11 DIAGNOSIS — X58XXXA Exposure to other specified factors, initial encounter: Secondary | ICD-10-CM | POA: Diagnosis not present

## 2019-09-11 DIAGNOSIS — I4510 Unspecified right bundle-branch block: Secondary | ICD-10-CM | POA: Diagnosis not present

## 2019-09-11 DIAGNOSIS — Z87891 Personal history of nicotine dependence: Secondary | ICD-10-CM | POA: Insufficient documentation

## 2019-09-11 DIAGNOSIS — I5022 Chronic systolic (congestive) heart failure: Secondary | ICD-10-CM | POA: Diagnosis not present

## 2019-09-11 DIAGNOSIS — Z7982 Long term (current) use of aspirin: Secondary | ICD-10-CM | POA: Diagnosis not present

## 2019-09-11 DIAGNOSIS — Y929 Unspecified place or not applicable: Secondary | ICD-10-CM | POA: Diagnosis not present

## 2019-09-11 DIAGNOSIS — S161XXA Strain of muscle, fascia and tendon at neck level, initial encounter: Secondary | ICD-10-CM

## 2019-09-11 DIAGNOSIS — M542 Cervicalgia: Secondary | ICD-10-CM | POA: Diagnosis not present

## 2019-09-11 DIAGNOSIS — S199XXA Unspecified injury of neck, initial encounter: Secondary | ICD-10-CM | POA: Diagnosis present

## 2019-09-11 LAB — CBC WITH DIFFERENTIAL/PLATELET
Abs Immature Granulocytes: 0.01 10*3/uL (ref 0.00–0.07)
Basophils Absolute: 0 10*3/uL (ref 0.0–0.1)
Basophils Relative: 1 %
Eosinophils Absolute: 0.4 10*3/uL (ref 0.0–0.5)
Eosinophils Relative: 5 %
HCT: 41.1 % (ref 39.0–52.0)
Hemoglobin: 13 g/dL (ref 13.0–17.0)
Immature Granulocytes: 0 %
Lymphocytes Relative: 23 %
Lymphs Abs: 1.6 10*3/uL (ref 0.7–4.0)
MCH: 30.3 pg (ref 26.0–34.0)
MCHC: 31.6 g/dL (ref 30.0–36.0)
MCV: 95.8 fL (ref 80.0–100.0)
Monocytes Absolute: 0.8 10*3/uL (ref 0.1–1.0)
Monocytes Relative: 12 %
Neutro Abs: 4.1 10*3/uL (ref 1.7–7.7)
Neutrophils Relative %: 59 %
Platelets: 207 10*3/uL (ref 150–400)
RBC: 4.29 MIL/uL (ref 4.22–5.81)
RDW: 14.6 % (ref 11.5–15.5)
WBC: 6.8 10*3/uL (ref 4.0–10.5)
nRBC: 0 % (ref 0.0–0.2)

## 2019-09-11 LAB — BASIC METABOLIC PANEL
Anion gap: 14 (ref 5–15)
BUN: 20 mg/dL (ref 8–23)
CO2: 22 mmol/L (ref 22–32)
Calcium: 9.1 mg/dL (ref 8.9–10.3)
Chloride: 101 mmol/L (ref 98–111)
Creatinine, Ser: 1.25 mg/dL — ABNORMAL HIGH (ref 0.61–1.24)
GFR calc Af Amer: >60 mL/min (ref >60)
GFR calc non Af Amer: 56 mL/min — ABNORMAL LOW (ref >60)
Glucose, Bld: 88 mg/dL (ref 70–99)
Potassium: 4.2 mmol/L (ref 3.5–5.1)
Sodium: 137 mmol/L (ref 135–145)

## 2019-09-11 LAB — TROPONIN I (HIGH SENSITIVITY): Troponin I (High Sensitivity): 22 ng/L — ABNORMAL HIGH (ref <18)

## 2019-09-11 MED ORDER — TIZANIDINE HCL 4 MG PO TABS
4.0000 mg | ORAL_TABLET | Freq: Once | ORAL | Status: AC
  Administered 2019-09-11: 21:00:00 4 mg via ORAL
  Filled 2019-09-11: qty 1

## 2019-09-11 MED ORDER — LABETALOL HCL 5 MG/ML IV SOLN
20.0000 mg | Freq: Once | INTRAVENOUS | Status: AC
  Administered 2019-09-11: 20 mg via INTRAVENOUS
  Filled 2019-09-11: qty 4

## 2019-09-11 MED ORDER — ACETAMINOPHEN 500 MG PO TABS
1000.0000 mg | ORAL_TABLET | Freq: Once | ORAL | Status: AC
  Administered 2019-09-11: 1000 mg via ORAL
  Filled 2019-09-11: qty 2

## 2019-09-11 MED ORDER — IOHEXOL 350 MG/ML SOLN
145.0000 mL | Freq: Once | INTRAVENOUS | Status: AC | PRN
  Administered 2019-09-11: 145 mL via INTRAVENOUS

## 2019-09-11 MED ORDER — LABETALOL HCL 5 MG/ML IV SOLN
40.0000 mg | Freq: Once | INTRAVENOUS | Status: AC
  Administered 2019-09-12: 40 mg via INTRAVENOUS
  Filled 2019-09-11: qty 8

## 2019-09-11 NOTE — ED Provider Notes (Signed)
Plan at signout to f/u on Ct imaging/labs Pt may need admission   Zadie Rhine, MD 09/11/19 2335

## 2019-09-11 NOTE — ED Provider Notes (Signed)
[facility] EMERGENCY DEPARTMENT Provider Note   CSN: [id number] Arrival date & time: 09/11/19  1431     History Chief Complaint  Patient presents with   Neck Pain    TORREZ RENFROE is a 74 y.o. male.  HPI MONTREL DONAHOE is a 74 y.o. male with a medical history of ckd, htn, cad who presents to the ED for neck pain.  He reports bilateral, lateral lower neck pain over his trapezius into his lateral neck that began spontaneously 3 days ago.  He reports only has pain with movement, no pain while at rest.  He denies any recent trauma, fall, injury, illness, fever.  Denies change in his vision, numbness or weakness of his extremities, chest pain, shortness of breath.      Past Medical History:  Diagnosis Date   Chronic renal insufficiency    WITH BASELINE CREATININE OF 1.6   Chronic systolic CHF (congestive heart failure) (HCC)    Complete heart block (HCC)    s/p PPM by Dr Reyes Ivan   Coronary disease    NONOBSTRUCTIVE   GERD (gastroesophageal reflux disease)    Gout    Hyperkalemia    Hypertension    Nonischemic cardiomyopathy (HCC)    Presence of permanent cardiac pacemaker     Patient Active Problem List   Diagnosis Date Noted   Pacemaker lead fracture 02/14/2015   Pacemaker malfunction 02/14/2015   Chronic systolic CHF (congestive heart failure) (HCC) 08/11/2013   Dyspnea 07/08/2013   CKD (chronic kidney disease), stage III    Complete heart block (HCC) 03/21/2011   Hypertension 03/21/2011   Hyperkalemia 03/21/2011    Past Surgical History:  Procedure Laterality Date   CARDIAC CATHETERIZATION  08/2013   EP IMPLANTABLE DEVICE N/A 02/15/2015   Procedure: Lead Revision/Repair;  Surgeon: Hillis Range, MD;  Location: [facility] INVASIVE CV LAB;  Service: Cardiovascular;  Laterality: N/A;   INGUINAL HERNIA REPAIR Bilateral    INSERT / REPLACE / REMOVE PACEMAKER  05/20/2009   MDT by Dr Reyes Ivan for complete heart block   LEFT AND RIGHT  HEART CATHETERIZATION WITH CORONARY ANGIOGRAM N/A 08/16/2013   Procedure: LEFT AND RIGHT HEART CATHETERIZATION WITH CORONARY ANGIOGRAM;  Surgeon: [name] Swaziland, MD;  Location: [facility] CATH LAB;  Service: Cardiovascular;  Laterality: N/A;       Family History  Problem Relation Age of Onset   Diabetes Father     Social History   Tobacco Use   Smoking status: Former Smoker    Packs/Obediah Welles: 1.00    Years: 15.00    Pack years: 15.00    Types: Cigarettes   Smokeless tobacco: Never Used   Tobacco comment: "quit smoking cigarettes in 1985"  Substance Use Topics   Alcohol use: Yes    Alcohol/week: 7.0 standard drinks    Types: 7 Shots of liquor per week   Drug use: No    Home Medications Prior to Admission medications   Medication Sig Start Date End Date Taking? Authorizing Provider  acetaminophen (TYLENOL) 325 MG tablet Take 650 mg by mouth every 6 (six) hours as needed for headache (pain).   Yes [provider]  ADVAIR DISKUS 250-50 MCG/DOSE AEPB INHALE 1 PUFF TWICE A Kisean Rollo IN MORNING AND EVENING APPROXIMATELY 12 HOURS APART Patient taking differently: Inhale 1 puff into the lungs every 12 (twelve) hours.  03/31/19  Yes Dorothyann Peng, MD  allopurinol (ZYLOPRIM) 100 MG tablet TAKE 1 TABLET BY MOUTH EVERY Javen Hinderliter Patient taking differently: Take 100  mg by mouth daily.  05/19/19  Yes Dorothyann Peng, MD  Ascorbic Acid (VITAMIN C) 1000 MG tablet Take 2,000 mg by mouth daily.    Yes [provider]  aspirin EC 81 MG tablet Take 81 mg by mouth daily.   Yes [provider]  carvedilol (COREG) 12.5 MG tablet Take 1 tablet (12.5 mg total) by mouth 2 (two) times daily. 05/23/19 05/17/20 Yes Swaziland, [name], MD  Cholecalciferol (VITAMIN D-3) 1000 UNITS CAPS Take 2,000 Units by mouth daily.    Yes [provider]  Colchicine 0.6 MG CAPS Take 0.6 mg by mouth daily as needed (gout).  10/10/13  Yes [provider]  Fexofenadine HCl (ALLEGRA PO) Take 1 tablet by mouth  daily as needed (itching).   Yes [provider]  furosemide (LASIX) 40 MG tablet TAKE 1 TABLET BY MOUTH EVERY Siah Steely Patient taking differently: Take 40 mg by mouth daily.  04/28/19  Yes Dorothyann Peng, MD  Garlic 1000 MG CAPS Take 3,000 mg by mouth daily.   Yes [provider]  ibuprofen (ADVIL) 200 MG tablet Take 400 mg by mouth every 6 (six) hours as needed for headache (pain).   Yes [provider]  isosorbide-hydrALAZINE (BIDIL) 20-37.5 MG tablet Take 1 tablet by mouth 3 (three) times daily. 06/09/19  Yes Swaziland, [name], MD    Allergies    Avalide [irbesartan-hydrochlorothiazide], Penicillins, and Tinactin [tolnaftate]  Review of Systems   Review of Systems  Constitutional: Negative for chills and fever.  HENT: Negative for congestion, ear pain and sore throat.   Eyes: Negative for pain, discharge, redness and visual disturbance.  Respiratory: Negative for cough and shortness of breath.   Cardiovascular: Negative for chest pain and palpitations.  Gastrointestinal: Negative for abdominal pain, diarrhea, nausea and vomiting.  Genitourinary: Negative for dysuria, frequency and hematuria.  Musculoskeletal: Positive for neck pain. Negative for arthralgias, back pain and neck stiffness.  Skin: Negative for color change and rash.  Neurological: Negative for seizures, syncope and weakness.  Psychiatric/Behavioral: Negative for agitation.  All other systems reviewed and are negative.   Physical Exam Updated Vital Signs BP (!) 193/104    Pulse 72    Temp 98.2 F (36.8 C) (Oral)    Resp 17    SpO2 96%   Physical Exam Vitals and nursing note reviewed.  Constitutional:      General: He is not in acute distress.    Appearance: Normal appearance. He is well-developed. He is not ill-appearing.  HENT:     Head: Normocephalic and atraumatic.     Right Ear: External ear normal.     Left Ear: External ear normal.     Nose: Nose normal. No congestion.      Mouth/Throat:     Mouth: Mucous membranes are moist.  Eyes:     General:        Right eye: No discharge.        Left eye: No discharge.     Conjunctiva/sclera: Conjunctivae normal.  Neck:     Comments: Limited range of motion secondary to pain Neck is supple, non tender, no midline tenderness Cardiovascular:     Rate and Rhythm: Normal rate and regular rhythm.     Pulses: Normal pulses.     Heart sounds: Normal heart sounds. No murmur.  Pulmonary:     Effort: Pulmonary effort is normal. No respiratory distress.     Breath sounds: Normal breath sounds. No wheezing or rales.  Abdominal:  General: Abdomen is flat. There is no distension.     Palpations: Abdomen is soft.     Tenderness: There is no abdominal tenderness.  Musculoskeletal:        General: No signs of injury. Normal range of motion.     Cervical back: Neck supple. No rigidity or tenderness.  Skin:    General: Skin is warm and dry.     Capillary Refill: Capillary refill takes less than 2 seconds.  Neurological:     General: No focal deficit present.     Mental Status: He is alert and oriented to person, place, and time. Mental status is at baseline.     Cranial Nerves: No cranial nerve deficit.     Sensory: No sensory deficit.     Motor: No weakness.     Coordination: Coordination normal.     Gait: Gait normal.  Psychiatric:        Mood and Affect: Mood normal.        Behavior: Behavior normal.     ED Results / Procedures / Treatments   Labs (all labs ordered are listed, but only abnormal results are displayed) Labs Reviewed  BASIC METABOLIC PANEL - Abnormal; Notable for the following components:      Result Value   Creatinine, Ser 1.25 (*)    GFR calc non Af Amer 56 (*)    All other components within normal limits  TROPONIN I (HIGH SENSITIVITY) - Abnormal; Notable for the following components:   Troponin I (High Sensitivity) 22 (*)    All other components within normal limits  CBC WITH  DIFFERENTIAL/PLATELET  TROPONIN I (HIGH SENSITIVITY)    EKG EKG Interpretation  Date/Time:  Sunday September 11 2019 20:01:55 EST Ventricular Rate:  83 PR Interval:    QRS Duration: 152 QT Interval:  401 QTC Calculation: 472 R Axis:   -107 Text Interpretation: Sinus rhythm Right bundle branch block Anterolateral infarct, old Baseline wander in lead(s) V5 No significant change was found Confirmed by Ezequiel Essex [phone]) on 09/11/2019 8:06:51 PM Also confirmed by Ezequiel Essex [phone]), editor Oswaldo Milian, Beverly ([id number])  on 09/12/2019 12:17:15 PM   Radiology CT Angio Head W or Wo Contrast  Result Date: 09/12/2019 CLINICAL DATA:  Initial evaluation for acute neck pain. EXAM: CT ANGIOGRAPHY HEAD AND NECK TECHNIQUE: Multidetector CT imaging of the head and neck was performed using the standard protocol during bolus administration of intravenous contrast. Multiplanar CT image reconstructions and MIPs were obtained to evaluate the vascular anatomy. Carotid stenosis measurements (when applicable) are obtained utilizing NASCET criteria, using the distal internal carotid diameter as the denominator. CONTRAST:  170mL OMNIPAQUE IOHEXOL 350 MG/ML SOLN COMPARISON:  None. FINDINGS: CT HEAD FINDINGS Brain: Cerebral volume within normal limits for patient age. No evidence for acute intracranial hemorrhage. No findings to suggest acute large vessel territory infarct. No mass lesion, midline shift, or mass effect. Ventricles are normal in size without evidence for hydrocephalus. No extra-axial fluid collection identified. Vascular: No hyperdense vessel identified. Skull: Scalp soft tissues demonstrate no acute abnormality. Calvarium intact. Sinuses/Orbits: Globes and orbital soft tissues within normal limits. Chronic mucoperiosteal thickening noted within the ethmoidal air cells and maxillary sinuses. Paranasal sinuses are otherwise clear. No mastoid effusion. CTA NECK FINDINGS Aortic arch: Visualized aortic arch  of normal caliber with normal 3 vessel morphology. Minor atheromatous change within the arch itself. No hemodynamically significant stenosis seen about the origin of the great vessels. Visualized subclavian arteries widely patent. Right carotid system: Right common  and internal carotid arteries widely patent without stenosis, dissection or occlusion. Minimal atheromatous change about the right bifurcation without stenosis. Left carotid system: Left common in internal carotid arteries widely patent without stenosis, dissection or occlusion. Minor atheromatous change about the left bifurcation without stenosis. Vertebral arteries: Both vertebral arteries arise from the subclavian arteries. Vertebral arteries widely patent within the neck without stenosis, dissection, or occlusion. Skeleton: No acute osseous abnormality. No discrete lytic or blastic osseous lesions. Moderate multilevel cervical spondylosis, most pronounced at C5-6 and C6-7. Other neck: No other acute soft tissue abnormality within the neck. No mass lesion or adenopathy. Upper chest: Better evaluated on concomitant CT of the chest. Review of the MIP images confirms the above findings CTA HEAD FINDINGS Anterior circulation: Petrous segments widely patent bilaterally. Minor atheromatous change within the carotid siphons without hemodynamically significant stenosis. A1 segments widely patent. Normal anterior communicating artery. Anterior cerebral arteries widely patent to their distal aspects. No M1 stenosis or occlusion. Normal MCA bifurcations. Distal MCA branches well perfused and symmetric. Posterior circulation: Vertebral arteries widely patent to the vertebrobasilar junction without stenosis. Posterior inferior cerebral arteries patent bilaterally. Basilar widely patent to its distal aspect. Superior cerebral arteries patent bilaterally. Both PCAs primarily supplied via the basilar and are well perfused to their distal aspects. Venous sinuses:  Patent. Anatomic variants: None significant.  No intracranial aneurysm. Review of the MIP images confirms the above findings IMPRESSION: CT HEAD IMPRESSION: Negative head CT.  No acute intracranial abnormality identified. CTA HEAD AND NECK IMPRESSION: 1. Negative CTA of the head and neck. No large vessel occlusion, hemodynamically significant stenosis, dissection, or other acute vascular abnormality. 2. Minor atherosclerotic change about the carotid bifurcations and carotid siphons without stenosis. Electronically Signed   By: Rise Mu M.D.   On: 09/12/2019 00:30   CT Angio Neck W and/or Wo Contrast  Result Date: 09/12/2019 CLINICAL DATA:  Initial evaluation for acute neck pain. EXAM: CT ANGIOGRAPHY HEAD AND NECK TECHNIQUE: Multidetector CT imaging of the head and neck was performed using the standard protocol during bolus administration of intravenous contrast. Multiplanar CT image reconstructions and MIPs were obtained to evaluate the vascular anatomy. Carotid stenosis measurements (when applicable) are obtained utilizing NASCET criteria, using the distal internal carotid diameter as the denominator. CONTRAST:  OMNIPAQUE IOHEXOL 350 MG/ML SOLN COMPARISON:  None. FINDINGS: CT HEAD FINDINGS Brain: Cerebral volume within normal limits for patient age. No evidence for acute intracranial hemorrhage. No findings to suggest acute large vessel territory infarct. No mass lesion, midline shift, or mass effect. Ventricles are normal in size without evidence for hydrocephalus. No extra-axial fluid collection identified. Vascular: No hyperdense vessel identified. Skull: Scalp soft tissues demonstrate no acute abnormality. Calvarium intact. Sinuses/Orbits: Globes and orbital soft tissues within normal limits. Chronic mucoperiosteal thickening noted within the ethmoidal air cells and maxillary sinuses. Paranasal sinuses are otherwise clear. No mastoid effusion. CTA NECK FINDINGS Aortic arch: Visualized  aortic arch of normal caliber with normal 3 vessel morphology. Minor atheromatous change within the arch itself. No hemodynamically significant stenosis seen about the origin of the great vessels. Visualized subclavian arteries widely patent. Right carotid system: Right common and internal carotid arteries widely patent without stenosis, dissection or occlusion. Minimal atheromatous change about the right bifurcation without stenosis. Left carotid system: Left common in internal carotid arteries widely patent without stenosis, dissection or occlusion. Minor atheromatous change about the left bifurcation without stenosis. Vertebral arteries: Both vertebral arteries arise from the subclavian arteries. Vertebral arteries widely patent  within the neck without stenosis, dissection, or occlusion. Skeleton: No acute osseous abnormality. No discrete lytic or blastic osseous lesions. Moderate multilevel cervical spondylosis, most pronounced at C5-6 and C6-7. Other neck: No other acute soft tissue abnormality within the neck. No mass lesion or adenopathy. Upper chest: Better evaluated on concomitant CT of the chest. Review of the MIP images confirms the above findings CTA HEAD FINDINGS Anterior circulation: Petrous segments widely patent bilaterally. Minor atheromatous change within the carotid siphons without hemodynamically significant stenosis. A1 segments widely patent. Normal anterior communicating artery. Anterior cerebral arteries widely patent to their distal aspects. No M1 stenosis or occlusion. Normal MCA bifurcations. Distal MCA branches well perfused and symmetric. Posterior circulation: Vertebral arteries widely patent to the vertebrobasilar junction without stenosis. Posterior inferior cerebral arteries patent bilaterally. Basilar widely patent to its distal aspect. Superior cerebral arteries patent bilaterally. Both PCAs primarily supplied via the basilar and are well perfused to their distal aspects. Venous  sinuses: Patent. Anatomic variants: None significant.  No intracranial aneurysm. Review of the MIP images confirms the above findings IMPRESSION: CT HEAD IMPRESSION: Negative head CT.  No acute intracranial abnormality identified. CTA HEAD AND NECK IMPRESSION: 1. Negative CTA of the head and neck. No large vessel occlusion, hemodynamically significant stenosis, dissection, or other acute vascular abnormality. 2. Minor atherosclerotic change about the carotid bifurcations and carotid siphons without stenosis. Electronically Signed   By: Rise Mu M.D.   On: 09/12/2019 00:30   CT Angio Chest/Abd/Pel for Dissection W and/or Wo Contrast  Result Date: 09/12/2019 CLINICAL DATA:  Initial evaluation for acute chest pain, back pain. EXAM: CT ANGIOGRAPHY CHEST, ABDOMEN AND PELVIS TECHNIQUE: Multidetector CT imaging through the chest, abdomen and pelvis was performed using the standard protocol during bolus administration of intravenous contrast. Multiplanar reconstructed images and MIPs were obtained and reviewed to evaluate the vascular anatomy. CONTRAST:  OMNIPAQUE IOHEXOL 350 MG/ML SOLN COMPARISON:  Prior radiograph from 07/10/2017. FINDINGS: CTA CHEST FINDINGS Cardiovascular: Precontrast imaging through the intrathoracic aorta demonstrates mild atheromatous plaque within the aortic arch. No mural thrombus or other acute abnormality. Post-contrast imaging demonstrates no evidence for dissection. No aneurysm. Visualized great vessels within normal limits. Mild valvular calcifications noted about the aortic valve. Cardiomegaly. Scattered 3 vessel coronary artery calcifications. Left-sided pacemaker/AICD in place. No pericardial effusion. Limited assessment of the pulmonary arterial tree grossly unremarkable. Mediastinum/Nodes: Thyroid within normal limits. No pathologically enlarged mediastinal, hilar, or axillary lymph nodes. Esophagus within normal limits. Lungs/Pleura: Tracheobronchial tree intact and  patent. Lungs normally inflated. No focal infiltrates. No edema or effusion. No pneumothorax. No worrisome pulmonary nodule or mass. Musculoskeletal: External soft tissues demonstrate no acute finding. No acute osseous abnormality. No discrete lytic or blastic osseous lesions. Review of the MIP images confirms the above findings. CTA ABDOMEN AND PELVIS FINDINGS VASCULAR Aorta: Normal intravascular enhancement seen throughout the intra-abdominal aorta without evidence for dissection or aneurysm. Mild eccentric plaque seen within the infrarenal aorta. Celiac: Celiac axis and its branch vessels are widely patent. SMA: SMA widely patent to its distal aspect. Renals: Single right renal artery, with 2 left renal arteries, all of which appear patent. IMA: IMA well opacified and patent. Inflow: Scattered atheromatous plaque within the iliac arteries bilaterally without flow-limiting stenosis. Veins: No acute venous abnormality identified allowing for timing the contrast bolus. Review of the MIP images confirms the above findings. NON-VASCULAR Hepatobiliary: Liver demonstrates a normal contrast enhanced appearance. Gallbladder within normal limits. No biliary dilatation. Pancreas: Diffuse fatty infiltration the pancreas noted.  Pancreas otherwise unremarkable without acute abnormality. Spleen: Spleen within normal limits. Adrenals/Urinary Tract: Adrenal glands are normal. Kidneys equal in size with symmetric enhancement. Subcentimeter hypodensity at the lower pole left kidney too small the characterize, but statistically likely reflects a small cyst. No nephrolithiasis, hydronephrosis, or focal enhancing renal mass. No hydroureter. Bladder moderately distended without acute abnormality. Stomach/Bowel: Probable small hiatal hernia noted. Stomach otherwise unremarkable. No evidence for bowel obstruction. Normal appendix. Mild colonic diverticulosis without evidence for acute diverticulitis. No acute inflammatory changes about  the bowels. Moderate stool seen impacted within the distal colon, which could reflect constipation. Lymphatic: No pathologically enlarged intra-abdominal or pelvic lymph nodes. Reproductive: Prostate and seminal vesicles within normal limits. Other: No free air or fluid. Mildly complex fat containing paraumbilical hernia noted, measuring up to approximately 3.5 cm in size. Musculoskeletal: External soft tissues demonstrate no acute abnormality. No acute osseous abnormality. Chronic endplate Schmorl's nodes noted within the lower lumbar spine. Diffuse sclerosis and heterogeneity seen throughout the sacrum, with extension into the right greater than left iliac wings. 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 Review of the MIP images confirms the above findings. IMPRESSION: 1. No CTA evidence for aortic dissection or other acute aortic pathology. 2. Cardiomegaly with diffuse 3 vessel coronary artery calcifications and mild aortic valvular calcifications. 3. No other acute abnormality within the chest, abdomen, and pelvis. 4. Colonic diverticulosis without evidence for acute diverticulitis. 5. Mildly complex fat containing paraumbilical hernia. 6. 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 Electronically Signed   By: Rise Mu M.D.   On: 09/12/2019 01:12    Procedures Procedures (including critical care time)  Medications Ordered in ED Medications  labetalol (NORMODYNE) injection 20 mg (20 mg Intravenous Given 09/11/19 2107)  tiZANidine (ZANAFLEX) tablet 4 mg (4 mg Oral Given 09/11/19 2121)  acetaminophen (TYLENOL) tablet 1,000 mg (1,000 mg Oral Given 09/11/19 2121)  labetalol (NORMODYNE) injection 40 mg (40 mg Intravenous Given 09/12/19 0028)  iohexol (OMNIPAQUE) 350 MG/ML injection 145 mL (145 mLs Intravenous Contrast Given 09/11/19 2346)    ED Course  I have  reviewed the triage vital signs and the nursing notes.  Pertinent labs & imaging results that were available during my care of the patient were reviewed by me and considered in my medical decision making (see chart for details).    MDM Rules/Calculators/A&P                      TILER BRANDIS is a 74 y.o. male with a medical history of ckd, htn, cad who presents to the ED for neck pain.  He reports bilateral, lateral lower neck pain over his trapezius into his lateral neck that began spontaneously 3 days ago.  He reports only has pain with movement, no pain while at rest.  He denies any recent trauma, fall, injury, illness, fever.  Denies change in his vision, numbness or weakness of his extremities, chest pain, shortness of breath.  HPI and physical exam as above. He presents awake, alert, hemodynamically stable, afebrile, non toxic. Limited neck range of motion secondary to pain. Neck is supple, non tender, no midline tenderness. Non focal neurologic exam. No signs of trauma. He is noted to be hypertensive and states has not been taking his bp medicines.  Treated with labetalol for blood pressure. His troponin is bumped and concerning for hypertensive emergency. He may  need admission for this. He denies chest pain and his ecg is non ischemic. We decided to obtain cta to imaging of his chest, abdomen, head and neck to rule out dissection. These are pending at time of signout.   Pt care was handed off to Dr. Bebe Shaggy at 0001.  Complete history and physical and current plan have been communicated.  Please refer to their note for the remainder of ED care and ultimate disposition. The above statements and care plan were discussed with my attending physician Dr Manus Gunning who voiced agreement with plan.     Final Clinical Impression(s) / ED Diagnoses Final diagnoses:  Acute strain of neck muscle, initial encounter    Rx / DC Orders ED Discharge Orders    None       Amara Justen, Ladona Ridgel, MD 09/13/19  3220    Glynn Octave, MD 09/13/19 [phone]

## 2019-09-11 NOTE — ED Triage Notes (Signed)
C/o neck pain x 3-4 days that is worse when he turns his head.  No known injury.

## 2019-09-12 LAB — TROPONIN I (HIGH SENSITIVITY): Troponin I (High Sensitivity): 14 ng/L (ref <18)

## 2019-09-12 NOTE — Discharge Instructions (Addendum)
   SEEK IMMEDIATE MEDICAL ATTENTION IF: You develop difficulties swallowing or breathing.  You have new or worse numbness, weakness, tingling, or movement problems in your arms or legs.  You develop increasing pain which is uncontrolled with medications.  You have change in bowel or bladder function, or other concerns.  You have an attack of chest pain lasting longer than usual, despite rest and treatment with the medications your caregiver has prescribed.  You wake from sleep with chest pain or shortness of breath.  You feel dizzy or faint.  You have chest pain not typical of your usual pain for which you originally saw your caregiver.

## 2019-09-12 NOTE — ED Provider Notes (Signed)
CT angio of his neck, chest and pelvis are all negative.  I have confirmed this with radiology. Blood pressure is improved Patient is awake alert, no acute distress watching TV. He flatly denies any chest pain/shortness of breath.  He reports he had pain in his right neck and he thought this was a muscle spasm all the time.  He reports his blood pressure is elevated because he drank large amount of scotch and also did not take his medicines  Patient is requesting discharge home. At this time, low suspicion for acute hypertensive emergency/ACS. We discussed strict return precautions   Zadie Rhine, MD 09/12/19 [phone]

## 2019-09-19 ENCOUNTER — Telehealth (INDEPENDENT_AMBULATORY_CARE_PROVIDER_SITE_OTHER): Payer: Medicare HMO | Admitting: Internal Medicine

## 2019-09-19 ENCOUNTER — Other Ambulatory Visit: Payer: Self-pay

## 2019-09-19 ENCOUNTER — Encounter: Payer: Self-pay | Admitting: Internal Medicine

## 2019-09-19 DIAGNOSIS — M545 Low back pain: Secondary | ICD-10-CM | POA: Diagnosis not present

## 2019-09-19 DIAGNOSIS — M542 Cervicalgia: Secondary | ICD-10-CM

## 2019-09-19 DIAGNOSIS — G8929 Other chronic pain: Secondary | ICD-10-CM | POA: Diagnosis not present

## 2019-09-19 NOTE — Progress Notes (Signed)
Virtual Visit via Phone   This visit type was conducted due to national recommendations for restrictions regarding the COVID-19 Pandemic (e.g. social distancing) in an effort to limit this patient's exposure and mitigate transmission in our community.  Due to his co-morbid illnesses, this patient is at least at moderate risk for complications without adequate follow up.  This format is felt to be most appropriate for this patient at this time.  All issues noted in this document were discussed and addressed.  A limited physical exam was performed with this format.    This visit type was conducted due to national recommendations for restrictions regarding the COVID-19 Pandemic (e.g. social distancing) in an effort to limit this patient's exposure and mitigate transmission in our community.  Patients identity confirmed using two different identifiers.  This format is felt to be most appropriate for this patient at this time.  All issues noted in this document were discussed and addressed.  No physical exam was performed (except for noted visual exam findings with Video Visits).    Date:  09/19/2019   ID:  Gregory, Bridges September 16, 1945, MRN [id number]  Patient Location:  Home  Provider location:   Office    Chief Complaint:  "I have neck/back pain. I want physical therapy"  History of Present Illness:    [name] is a 74 y.o. male who presents via video conferencing for a telehealth visit today.    The patient does not have symptoms concerning for COVID-19 infection (fever, chills, cough, or new shortness of breath).   He presents today for virtual visit. He prefers this method of contact due to COVID-19 pandemic. He wanted to meet today to discuss neck/back pain. He would like PT referral. He reports he went to [facility] ED on 09/11/19 for further evaluation of neck pain. He denies fall/trauma. Due to markedly elevated BP while in ER, he was worked up for dissection and w/u was negative.  He admits that he had not taken medication PTA and had been drinking the night before. He has not had any worsening of his sx since ER discharge. He reports having similar sx in the past while in the [employer], and PT was beneficial for him. He would like to do this now, prior to seeing a specialist. He denies UE, LE weakness and paresthesias.   Neck Pain  This is a chronic problem. The current episode started more than 1 year ago. The problem occurs intermittently. The pain is associated with a remote injury. The quality of the pain is described as aching. The pain is at a severity of 5/10. The pain is moderate. The symptoms are aggravated by twisting and position. Pertinent negatives include no chest pain, headaches or photophobia.     Past Medical History:  Diagnosis Date  . Chronic renal insufficiency    WITH BASELINE CREATININE OF 1.6  . Chronic systolic CHF (congestive heart failure) (HCC)   . Complete heart block (HCC)    s/p PPM by Dr Reyes Ivan  . Coronary disease    NONOBSTRUCTIVE  . GERD (gastroesophageal reflux disease)   . Gout   . Hyperkalemia   . Hypertension   . Nonischemic cardiomyopathy (HCC)   . Presence of permanent cardiac pacemaker    Past Surgical History:  Procedure Laterality Date  . CARDIAC CATHETERIZATION  08/2013  . EP IMPLANTABLE DEVICE N/A 02/15/2015   Procedure: Lead Revision/Repair;  Surgeon: Hillis Range, MD;  Location: [facility] INVASIVE CV LAB;  Service: Cardiovascular;  Laterality: N/A;  . INGUINAL HERNIA REPAIR Bilateral   . INSERT / REPLACE / REMOVE PACEMAKER  05/20/2009   MDT by Dr Reyes Ivan for complete heart block  . LEFT AND RIGHT HEART CATHETERIZATION WITH CORONARY ANGIOGRAM N/A 08/16/2013   Procedure: LEFT AND RIGHT HEART CATHETERIZATION WITH CORONARY ANGIOGRAM;  Surgeon: [name] Swaziland, MD;  Location[facility] CATH LAB;  Service: Cardiovascular;  Laterality: N/A;     Current Meds  Medication Sig  . acetaminophen (TYLENOL) 325 MG tablet Take 650 mg by mouth  every 6 (six) hours as needed for headache (pain).  . ADVAIR DISKUS 250-50 MCG/DOSE AEPB INHALE 1 PUFF TWICE A DAY IN MORNING AND EVENING APPROXIMATELY 12 HOURS APART (Patient taking differently: Inhale 1 puff into the lungs every 12 (twelve) hours. )  . allopurinol (ZYLOPRIM) 100 MG tablet TAKE 1 TABLET BY MOUTH EVERY DAY (Patient taking differently: Take 100 mg by mouth daily. )  . Ascorbic Acid (VITAMIN C) 1000 MG tablet Take 2,000 mg by mouth daily.   Marland Kitchen aspirin EC 81 MG tablet Take 81 mg by mouth daily.  . carvedilol (COREG) 12.5 MG tablet Take 1 tablet (12.5 mg total) by mouth 2 (two) times daily.  . Cholecalciferol (VITAMIN D-3) 1000 UNITS CAPS Take 2,000 Units by mouth daily.   . Colchicine 0.6 MG CAPS Take 0.6 mg by mouth daily as needed (gout).   . Fexofenadine HCl (ALLEGRA PO) Take 1 tablet by mouth daily as needed (itching).  . furosemide (LASIX) 40 MG tablet TAKE 1 TABLET BY MOUTH EVERY DAY (Patient taking differently: Take 40 mg by mouth daily. )  . Garlic 1000 MG CAPS Take 3,000 mg by mouth daily.  Marland Kitchen ibuprofen (ADVIL) 200 MG tablet Take 400 mg by mouth every 6 (six) hours as needed for headache (pain).  . isosorbide-hydrALAZINE (BIDIL) 20-37.5 MG tablet Take 1 tablet by mouth 3 (three) times daily.     Allergies:   Avalide [irbesartan-hydrochlorothiazide], Penicillins, and Tinactin [tolnaftate]   Social History   Tobacco Use  . Smoking status: Former Smoker    Packs/day: 1.00    Years: 15.00    Pack years: 15.00    Types: Cigarettes  . Smokeless tobacco: Never Used  . Tobacco comment: "quit smoking cigarettes in 1985"  Substance Use Topics  . Alcohol use: Yes    Alcohol/week: 7.0 standard drinks    Types: 7 Shots of liquor per week  . Drug use: No     Family Hx: The patient's family history includes Diabetes in his father.  ROS:   Please see the history of present illness.    Review of Systems  Constitutional: Negative.   Eyes: Negative for photophobia.    Respiratory: Negative.   Cardiovascular: Negative.  Negative for chest pain.  Gastrointestinal: Negative.   Musculoskeletal: Positive for back pain and neck pain.  Neurological: Negative.  Negative for headaches.  Psychiatric/Behavioral: Negative.     All other systems reviewed and are negative.   Labs/Other Tests and Data Reviewed:    Recent Labs: 08/17/2019: ALT 25 09/11/2019: BUN 20; Creatinine, Ser 1.25; Hemoglobin 13.0; Platelets 207; Potassium 4.2; Sodium 137   Recent Lipid Panel Lab Results  Component Value Date/Time   CHOL 174 08/17/2019 02:50 PM   TRIG 64 08/17/2019 02:50 PM   HDL 71 08/17/2019 02:50 PM   CHOLHDL 2.5 08/17/2019 02:50 PM   CHOLHDL 3 07/28/2013 07:46 AM   LDLCALC 91 08/17/2019 02:50 PM    Wt Readings from Last 3 Encounters:  08/17/19 259 lb 9.6 oz (117.8 kg)  08/17/19 259 lb 9.6 oz (117.8 kg)  06/30/19 245 lb (111.1 kg)     Exam:    Vital Signs:  There were no vitals taken for this visit.    Physical Exam  Pulmonary/Chest: Effort normal.  He is able to speak in full sentences.  PE not performed due to phone visit.   ASSESSMENT & PLAN:     1. Neck pain  I will refer him to physical therapy as requested. He is advised to apply muscle rub to affected area two to three times daily as needed. He is also advised to take magnesium 250-400mg  nightly. He is advised he can get this over the counter.   - Ambulatory referral to Physical Therapy  2. Chronic left-sided low back pain without sciatica  Again, I will refer him to PT for further evaluation/treatment.   - Ambulatory referral to Physical Therapy    COVID-19 Education: The signs and symptoms of COVID-19 were discussed with the patient and how to seek care for testing (follow up with PCP or arrange E-visit).  The importance of social distancing was discussed today.  Patient Risk:   After full review of this patients clinical status, I feel that they are at least moderate risk at this  time.  Time:   Today, I have spent 15 minutes/ seconds with the patient with telehealth technology discussing above diagnoses.     Medication Adjustments/Labs and Tests Ordered: Current medicines are reviewed at length with the patient today.  Concerns regarding medicines are outlined above.   Tests Ordered: Orders Placed This Encounter  Procedures  . Ambulatory referral to Physical Therapy    Medication Changes: No orders of the defined types were placed in this encounter.   Disposition:  Follow up prn  Signed, [name], MD

## 2019-09-19 NOTE — Patient Instructions (Signed)
Musculoskeletal Pain Musculoskeletal pain refers to aches and pains in your bones, joints, muscles, and the tissues that surround them. This pain can occur in any part of the body. It can last for a short time (acute) or a long time (chronic). A physical exam, lab tests, and imaging studies may be done to find the cause of your musculoskeletal pain. Follow these instructions at home:  Lifestyle  Try to control or lower your stress levels. Stress increases muscle tension and can worsen musculoskeletal pain. It is important to recognize when you are anxious or stressed and learn ways to manage it. This may include: ? Meditation or yoga. ? Cognitive or behavioral therapy. ? Acupuncture or massage therapy.  You may continue all activities unless the activities cause more pain. When the pain gets better, slowly resume your normal activities. Gradually increase the intensity and duration of your activities or exercise. Managing pain, stiffness, and swelling  Take over-the-counter and prescription medicines only as told by your health care provider.  When your pain is severe, bed rest may be helpful. Lie or sit in any position that is comfortable, but get out of bed and walk around at least every couple of hours.  If directed, apply heat to the affected area as often as told by your health care provider. Use the heat source that your health care provider recommends, such as a moist heat pack or a heating pad. ? Place a towel between your skin and the heat source. ? Leave the heat on for 20-30 minutes. ? Remove the heat if your skin turns bright red. This is especially important if you are unable to feel pain, heat, or cold. You may have a greater risk of getting burned.  If directed, put ice on the painful area. ? Put ice in a plastic bag. ? Place a towel between your skin and the bag. ? Leave the ice on for 20 minutes, 2-3 times a day. General instructions  Your health care provider may  recommend that you see a physical therapist. This person can help you come up with a safe exercise program. Do any exercises as told by your physical therapist.  Keep all follow-up visits, including any physical therapy visits, as told by your health care providers. This is important. Contact a health care provider if:  Your pain gets worse.  Medicines do not help ease your pain.  You cannot use the part of your body that hurts, such as your arm, leg, or neck.  You have trouble sleeping.  You have trouble doing your normal activities. Get help right away if:  You have a new injury and your pain is worse or different.  You feel numb or you have tingling in the painful area. Summary  Musculoskeletal pain refers to aches and pains in your bones, joints, muscles, and the tissues that surround them.  This pain can occur in any part of the body.  Your health care provider may recommend that you see a physical therapist. This person can help you come up with a safe exercise program. Do any exercises as told by your physical therapist.  Lower your stress level. Stress can worsen musculoskeletal pain. Ways to lower stress may include meditation, yoga, cognitive or behavioral therapy, acupuncture, and massage therapy. This information is not intended to replace advice given to you by your health care provider. Make sure you discuss any questions you have with your health care provider. Document Revised: 08/07/2017 Document Reviewed: 09/24/2016 Elsevier Patient   Education  2020 [employer]vier Inc[employer]  

## 2019-09-21 ENCOUNTER — Ambulatory Visit: Payer: Medicare HMO | Attending: Internal Medicine | Admitting: Physical Therapy

## 2019-09-21 ENCOUNTER — Encounter: Payer: Self-pay | Admitting: Physical Therapy

## 2019-09-21 ENCOUNTER — Other Ambulatory Visit: Payer: Self-pay

## 2019-09-21 DIAGNOSIS — R252 Cramp and spasm: Secondary | ICD-10-CM | POA: Diagnosis not present

## 2019-09-21 DIAGNOSIS — M5442 Lumbago with sciatica, left side: Secondary | ICD-10-CM | POA: Diagnosis not present

## 2019-09-21 DIAGNOSIS — G8929 Other chronic pain: Secondary | ICD-10-CM | POA: Diagnosis not present

## 2019-09-21 DIAGNOSIS — M542 Cervicalgia: Secondary | ICD-10-CM | POA: Insufficient documentation

## 2019-09-22 ENCOUNTER — Encounter: Payer: Self-pay | Admitting: Physical Therapy

## 2019-09-22 NOTE — Therapy (Signed)
[place], [place], [address] Phone: [phone]   Fax:  [phone]  Physical Therapy Evaluation  Patient Details  Name: HUSAM HOHN MRN: [id number] Date of Birth: 12-Jul-1946 Referring Provider (PT): Dr Glendale Chard    Encounter Date: 09/21/2019  PT End of Session - 09/21/19 1256    Visit Number  1    Number of Visits  12    Date for PT Re-Evaluation  11/02/19    Authorization Type  [employer]    PT Start Time  6712    PT Stop Time  1246    PT Time Calculation (min)  61 min    Activity Tolerance  Patient tolerated treatment well    Behavior During Therapy  [facility] for tasks assessed/performed       Past Medical History:  Diagnosis Date  . Chronic renal insufficiency    WITH BASELINE CREATININE OF 1.6  . Chronic systolic CHF (congestive heart failure) ([place])   . Complete heart block (HCC)    s/p PPM by Dr Verlon Setting  . Coronary disease    NONOBSTRUCTIVE  . GERD (gastroesophageal reflux disease)   . Gout   . Hyperkalemia   . Hypertension   . Nonischemic cardiomyopathy ([place])   . Presence of permanent cardiac pacemaker     Past Surgical History:  Procedure Laterality Date  . CARDIAC CATHETERIZATION  08/2013  . EP IMPLANTABLE DEVICE N/A 02/15/2015   Procedure: Lead Revision/Repair;  Surgeon: Thompson Grayer, MD;  Location: [place] CV LAB;  Service: Cardiovascular;  Laterality: N/A;  . INGUINAL HERNIA REPAIR Bilateral   . INSERT / REPLACE / REMOVE PACEMAKER  05/20/2009   MDT by Dr Verlon Setting for complete heart block  . LEFT AND RIGHT HEART CATHETERIZATION WITH CORONARY ANGIOGRAM N/A 08/16/2013   Procedure: LEFT AND RIGHT HEART CATHETERIZATION WITH CORONARY ANGIOGRAM;  Surgeon: [name] Martinique, MD;  Location[facility] CATH LAB;  Service: Cardiovascular;  Laterality: N/A;    There were no vitals filed for this visit.   Subjective Assessment - 09/21/19 1209    Subjective  Patient began with neck pain 3  weeks ago. The pain increased to the point where he had to go to the emergency room. Since that point it has improved but he continues to have decreased use of his right arm and decreased cervical mobility. He also has a long histroy of low back pain but has been able to self manage with exercises and stretches for years now. Since his neck pain has increased he has began also having increased back pain which has made it difficult for him to walk.    Pertinent History  CHF, Pacemaker    Limitations  Lifting;Sitting;Walking;Standing    How long can you sit comfortably?  depends    How long can you stand comfortably?  increased back pain with prolonged standing    How long can you walk comfortably?  depends on the day    Diagnostic tests  Neck CT: degeneration at c5-c6 C6 C-7    Patient Stated Goals  to have less pain/ to have his a program to work on    Currently in Pain?  Yes    Pain Score  4     Pain Location  Neck    Pain Orientation  Right;Left    Pain Descriptors / Indicators  Aching    Pain Type  Chronic pain    Pain Onset  More than a month ago  Pain Frequency  Intermittent    Aggravating Factors   turning his head    Pain Relieving Factors  rest    Effect of Pain on Daily Activities  difficulty turning his head         [facility] PT Assessment - 09/22/19 0001      Assessment   Medical Diagnosis  Cervical Pain, Left sided Low Back Pain     Referring Provider (PT)  Dr Dorothyann Peng     Onset Date/Surgical Date  --   3 weeks    Hand Dominance  Right    Next MD Visit  this summer     Prior Therapy  Nothing at this time       Precautions   Precautions  ICD/Pacemaker      Restrictions   Weight Bearing Restrictions  No      Balance Screen   Has the patient fallen in the past 6 months  No    Has the patient had a decrease in activity level because of a fear of falling?   No    Is the patient reluctant to leave their home because of a fear of falling?   No      Prior Function    Level of Independence  Independent    Vocation  Retired    Gaffer  was in the [employer]    Leisure  sky diving       Cognition   Overall Cognitive Status  Within Functional Limits for tasks assessed    Attention  Focused    Focused Attention  Appears intact    Memory  Appears intact    Awareness  Appears intact    Problem Solving  Appears intact      Observation/Other Assessments   Focus on Therapeutic Outcomes (FOTO)   63% limitation       Sensation   Light Touch  Appears Intact    Additional Comments  weakness into the right arm       Coordination   Gross Motor Movements are Fluid and Coordinated  Yes    Fine Motor Movements are Fluid and Coordinated  Yes      AROM   Right Shoulder Flexion  80 Degrees    Right Shoulder Internal Rotation  --   unable with the right arm    Right Shoulder External Rotation  --   unable with the right arm    Cervical - Right Rotation  30    Cervical - Left Rotation  26    Lumbar Flexion  50      PROM   Overall PROM Comments  could not assess passive hip motion because he could not lie supine       Strength   Right Shoulder Flexion  4/5    Right Shoulder Internal Rotation  4+/5    Right Shoulder External Rotation  4+/5    Right/Left hand  Left    Right Hand Grip (lbs)  60    Left Hand Grip (lbs)  70    Right Hip Flexion  5/5    Right Hip ADduction  5/5    Left Hip Flexion  4+/5    Left Hip Extension  4+/5    Right Knee Flexion  5/5    Right Knee Extension  5/5    Left Knee Flexion  5/5    Left Knee Extension  5/5      Palpation   Palpation comment  Spasming in  bilateral upper traps R > L       Ambulation/Gait   Gait Comments  ambulates with flexed posture; decreased bilateral hip flexion                 Objective measurements completed on examination: See above findings.      [facility] Adult PT Treatment/Exercise - 09/22/19 0001      Neck Exercises: Standing   Other Standing Exercises  tennis ball  release to peri-scapular area       Neck Exercises: Seated   Other Seated Exercise  scap retraction x20       Manual Therapy   Manual therapy comments  IASTYM to right upper trap       Neck Exercises: Stretches   Upper Trapezius Stretch  2 reps;20 seconds;Right;Left    Levator Stretch  2 reps;20 seconds;30 seconds;Right;Left             PT Education - 09/22/19 1533    Education Details  HEP and symptom mangement    Person(s) Educated  Patient    Methods  Explanation;Demonstration;Tactile cues;Verbal cues    Comprehension  Verbalized understanding;Returned demonstration;Verbal cues required;Tactile cues required       PT Short Term Goals - 09/21/19 1404      PT SHORT TERM GOAL #1   Title  Patient will increase gross  right shoulder strength to 4+/5    Time  3    Period  Weeks    Status  New    Target Date  10/12/19      PT SHORT TERM GOAL #2   Time  3    Period  Weeks    Status  New    Target Date  10/12/19      PT SHORT TERM GOAL #3   Title  Patient will increase lumbar flexion to 65 degrees without pain    Time  3    Period  Weeks    Status  New    Target Date  10/12/19        PT Long Term Goals - 09/21/19 1409      PT LONG TERM GOAL #1   Title  Patient will have a complete program for his necka and an updated program for lower back mangement    Time  6    Period  Weeks    Status  New    Target Date  11/02/19      PT LONG TERM GOAL #2   Title  Patient will put shirt on without pain    Time  6    Period  Weeks    Status  New    Target Date  11/02/19      PT LONG TERM GOAL #3   Title  Patient will put his socks on without shoulder and back pain    Time  6    Period  Weeks    Status  New    Target Date  11/02/19             Plan - 09/21/19 1257    Clinical Impression Statement  Patient is a 74 year old male with acute cervical pain and an exacerbation of chronic left lower back pain. He has limited strength and motion of his right  shoulder. He has significant spasming of his left upper trap. His back evalaution was limited because he has not been able to lie down 2nd to his neck. He has spasming in his lef  tparapsinals and gluteal. He has increased pain with foreward flexion but that is improved. He is currently sleeping sitting up. He is also having difficulty putting his shirt and socks on. He would benefit from skilled therapy to improve ability to perfrom ADL's and improve functional use of his right arm.    Personal Factors and Comorbidities  Comorbidity 1    Comorbidities  pacemaker, CHF,    Examination-Activity Limitations  Bend;Lift;Hygiene/Grooming;Carry    Examination-Participation Restrictions  Meal Prep;Shop;Laundry    Stability/Clinical Decision Making  Evolving/Moderate complexity   increased cervical pain   Clinical Decision Making  Moderate    Rehab Potential  Good    PT Frequency  2x / week    PT Duration  6 weeks    PT Treatment/Interventions  ADLs/Self Care Home Management;Cryotherapy;Moist Heat;Gait training;Therapeutic activities;Therapeutic exercise;Neuromuscular re-education;Patient/family education;Manual techniques;Passive range of motion;Dry needling;Taping    PT Next Visit Plan  patient can not lie supine at this time. Consider semi-reccumbet; patient has a pacemaker, gentle passive ROM of bilateral LE trigger point dry needling if we can position him correctly; LAD to lumbar spine. give lumbar stretches; consider seated core stability    PT Home Exercise Plan  upper trap stretch, levator stretch, tennis ball trigger point release; scpa retraction    Consulted and Agree with Plan of Care  Patient       Patient will benefit from skilled therapeutic intervention in order to improve the following deficits and impairments:  Abnormal gait, Decreased endurance, Decreased activity tolerance, Pain, Impaired UE functional use, Increased fascial restricitons, Decreased range of motion, Increased muscle  spasms  Visit Diagnosis: Cervicalgia  Cramp and spasm  Chronic left-sided low back pain with left-sided sciatica     Problem List Patient Active Problem List   Diagnosis Date Noted  . Pacemaker lead fracture 02/14/2015  . Pacemaker malfunction 02/14/2015  . Chronic systolic CHF (congestive heart failure) (HCC) 08/11/2013  . Dyspnea 07/08/2013  . CKD (chronic kidney disease), stage III   . Complete heart block (HCC) 03/21/2011  . Hypertension 03/21/2011  . Hyperkalemia 03/21/2011    Dessie Coma PT DPT  09/22/2019, 3:40 PM  [facility] [address] [place], [place], [address] Phone: [phone]   Fax:  [phone]  Name: HAILEY MILES MRN: [id number] Date of Birth: 01-Apr-1946

## 2019-09-27 ENCOUNTER — Telehealth: Payer: Self-pay

## 2019-09-27 NOTE — Telephone Encounter (Signed)
The pt was notified that Dr. Allyne Gee said no to writing a prescription for an opioid.  The pt said that he got some tylenol extra strength and solonpas patches for his pan. The pt said the solonpas patches didn't really work. The pt was told to try one of the different patches that are available at the pharmacy over-the-counter.

## 2019-09-28 ENCOUNTER — Telehealth: Payer: Self-pay

## 2019-09-28 NOTE — Telephone Encounter (Signed)
Left vm for pt to return call- insurance has denied advair. What would he like to do?

## 2019-09-29 ENCOUNTER — Encounter: Payer: Self-pay | Admitting: Physical Therapy

## 2019-09-29 ENCOUNTER — Other Ambulatory Visit: Payer: Self-pay

## 2019-09-29 ENCOUNTER — Ambulatory Visit: Payer: Medicare HMO | Admitting: Physical Therapy

## 2019-09-29 DIAGNOSIS — M542 Cervicalgia: Secondary | ICD-10-CM | POA: Diagnosis not present

## 2019-09-29 DIAGNOSIS — R252 Cramp and spasm: Secondary | ICD-10-CM

## 2019-09-29 DIAGNOSIS — G8929 Other chronic pain: Secondary | ICD-10-CM | POA: Diagnosis not present

## 2019-09-29 DIAGNOSIS — M5442 Lumbago with sciatica, left side: Secondary | ICD-10-CM | POA: Diagnosis not present

## 2019-09-30 ENCOUNTER — Encounter: Payer: Self-pay | Admitting: Physical Therapy

## 2019-09-30 NOTE — Therapy (Signed)
[facility] [address] [place], [place], [address] Phone: [phone]   Fax:  [phone]  Physical Therapy Treatment  Patient Details  Name: [name] MRN: [id number] Date of Birth: Jul 16, 1946 Referring Provider (PT): Dr Dorothyann Peng    Encounter Date: 09/29/2019  PT End of Session - 09/29/19 1658    Visit Number  2    Number of Visits  12    Date for PT Re-Evaluation  11/02/19    Authorization Type  A[employer]na Medicare    PT Start Time  1025   Patient 10 minutes late   PT Stop Time  1100    PT Time Calculation (min)  35 min    Activity Tolerance  Patient tolerated treatment well    Behavior During Therapy  [facility] for tasks assessed/performed       Past Medical History:  Diagnosis Date  . Chronic renal insufficiency    WITH BASELINE CREATININE OF 1.6  . Chronic systolic CHF (congestive heart failure) (HCC)   . Complete heart block (HCC)    s/p PPM by Dr Reyes Ivan  . Coronary disease    NONOBSTRUCTIVE  . GERD (gastroesophageal reflux disease)   . Gout   . Hyperkalemia   . Hypertension   . Nonischemic cardiomyopathy (HCC)   . Presence of permanent cardiac pacemaker     Past Surgical History:  Procedure Laterality Date  . CARDIAC CATHETERIZATION  08/2013  . EP IMPLANTABLE DEVICE N/A 02/15/2015   Procedure: Lead Revision/Repair;  Surgeon: Hillis Range, MD;  [facility] INV[facility] LAB;  Service: Cardiovascular;  Laterality: N/A;  . INGUINAL HERNIA REPAIR Bilateral   . INSERT / REPLACE / REMOVE PACEMAKER  05/20/2009   MDT by Dr Reyes Ivan for complete heart block  . LEFT AND RIGHT HEART CATHETERIZATION WITH CORONARY ANGIOGRAM N/A 08/16/2013   Procedure: LEFT AND RIGHT HEART CATHETERIZATION WITH CORONARY ANGIOGRAM;  Surgeon: [name] Swaziland, MD;  Location: [facility] CATH LAB;  Service: Cardiovascular;  Laterality: N/A;    There were no vitals filed for this visit.  Subjective Assessment - 09/29/19 1653    Subjective  Patient  reports his neck is much better. he had a friend do some manual work on it and he is having much less pain. His back is hurtinghim today. His is having minor pain into hisright LE.    Pertinent History  CHF, Pacemaker    Limitations  Lifting;Sitting;Walking;Standing    How long can you sit comfortably?  depends    How long can you stand comfortably?  increased back pain with prolonged standing    How long can you walk comfortably?  depends on the day    Diagnostic tests  Neck CT: degeneration at c5-c6 C6 C-7    Patient Stated Goals  to have less pain/ to have his a program to work on    Currently in Pain?  Yes    Pain Score  2     Pain Location  Neck    Pain Orientation  Right;Left    Pain Descriptors / Indicators  Aching    Pain Type  Chronic pain    Pain Onset  More than a month ago    Pain Frequency  Intermittent    Aggravating Factors   truning his head    Pain Relieving Factors  rest    Effect of Pain on Daily Activities  difficulty truning his head  [facility] Adult PT Treatment/Exercise - 09/30/19 0001      Lumbar Exercises: Stretches   Active Hamstring Stretch Limitations  3x20 sec hold     Piriformis Stretch Limitations  3x20 sec hold bilateral       Lumbar Exercises: Supine   Other Supine Lumbar Exercises  clamshell 2x10 green       Manual Therapy   Manual Therapy  Joint mobilization;Soft tissue mobilization;Manual Traction    Soft tissue mobilization  to left lumbar paraspinal and gluteal; IASTYM to upper trap     Manual Traction  LAD bilateral              PT Education - 09/29/19 1657    Education Details  reviewed HEP and technique with stretching    Person(s) Educated  Patient    Methods  Explanation;Demonstration;Tactile cues;Verbal cues    Comprehension  Verbalized understanding;Returned demonstration;Verbal cues required;Tactile cues required       PT Short Term Goals - 09/30/19 0812      PT SHORT TERM GOAL #1    Title  Patient will increase gross  right shoulder strength to 4+/5    Time  3    Period  Weeks    Status  New    Target Date  10/12/19      PT SHORT TERM GOAL #2   Title  Patient will increase cervical rotation by 10 degrees bilateral    Target Date  10/12/19      PT SHORT TERM GOAL #3   Title  Patient will increase lumbar flexion to 65 degrees without pain    Time  3    Period  Weeks    Status  On-going    Target Date  10/12/19        PT Long Term Goals - 09/21/19 1409      PT LONG TERM GOAL #1   Title  Patient will have a complete program for his necka and an updated program for lower back mangement    Time  6    Period  Weeks    Status  New    Target Date  11/02/19      PT LONG TERM GOAL #2   Title  Patient will put shirt on without pain    Time  6    Period  Weeks    Status  New    Target Date  11/02/19      PT LONG TERM GOAL #3   Title  Patient will put his socks on without shoulder and back pain    Time  6    Period  Weeks    Status  New    Target Date  11/02/19            Plan - 09/29/19 1708    Clinical Impression Statement  Patient was limited by time today but otherwise tolerated tresatment well. Therapy was given 2 new stretches and a new exercise for home. He had no increase in pain. He has signifcant left sided spasming in his left gluteal and lumar paraspinal. He reported a good stretch with LAD. He requsted e-stim but he has a pacemaker.    Personal Factors and Comorbidities  Comorbidity 1    Comorbidities  pacemaker, CHF,    Examination-Activity Limitations  Bend;Lift;Hygiene/Grooming;Carry    Stability/Clinical Decision Making  Evolving/Moderate complexity    Clinical Decision Making  Moderate    Rehab Potential  Good    PT Frequency  2x /  week    PT Duration  6 weeks    PT Treatment/Interventions  ADLs/Self Care Home Management;Cryotherapy;Moist Heat;Gait training;Therapeutic activities;Therapeutic exercise;Neuromuscular  re-education;Patient/family education;Manual techniques;Passive range of motion;Dry needling;Taping    PT Next Visit Plan  patient can not lie supine at this time. Consider semi-reccumbet; patient has a pacemaker, gentle passive ROM of bilateral LE trigger point dry needling if we can position him correctly; LAD to lumbar spine. give lumbar stretches; consider seated core stability    PT Home Exercise Plan  upper trap stretch, levator stretch, tennis ball trigger point release; scpa retraction    Consulted and Agree with Plan of Care  Patient       Patient will benefit from skilled therapeutic intervention in order to improve the following deficits and impairments:  Abnormal gait, Decreased endurance, Decreased activity tolerance, Pain, Impaired UE functional use, Increased fascial restricitons, Decreased range of motion, Increased muscle spasms  Visit Diagnosis: Cervicalgia  Cramp and spasm  Chronic left-sided low back pain with left-sided sciatica     Problem List Patient Active Problem List   Diagnosis Date Noted  . Pacemaker lead fracture 02/14/2015  . Pacemaker malfunction 02/14/2015  . Chronic systolic CHF (congestive heart failure) ([place]) 08/11/2013  . Dyspnea 07/08/2013  . CKD (chronic kidney disease), stage III   . Complete heart block ([place]) 03/21/2011  . Hypertension 03/21/2011  . Hyperkalemia 03/21/2011    Carney Living  PT DPT  09/30/2019, 8:29 AM  [facility] [address] [place], [place], [address] Phone: [phone]   Fax:  [phone]  Name: [name] MRN: [id number] Date of Birth: [birth year]

## 2019-10-04 ENCOUNTER — Telehealth: Payer: Self-pay

## 2019-10-04 ENCOUNTER — Ambulatory Visit: Payer: Medicare HMO | Admitting: Physical Therapy

## 2019-10-04 ENCOUNTER — Other Ambulatory Visit: Payer: Self-pay

## 2019-10-04 ENCOUNTER — Encounter: Payer: Self-pay | Admitting: Physical Therapy

## 2019-10-04 DIAGNOSIS — M5442 Lumbago with sciatica, left side: Secondary | ICD-10-CM | POA: Diagnosis not present

## 2019-10-04 DIAGNOSIS — G8929 Other chronic pain: Secondary | ICD-10-CM

## 2019-10-04 DIAGNOSIS — M542 Cervicalgia: Secondary | ICD-10-CM

## 2019-10-04 DIAGNOSIS — R252 Cramp and spasm: Secondary | ICD-10-CM | POA: Diagnosis not present

## 2019-10-04 NOTE — Telephone Encounter (Signed)
Called pt to inform him that insurance has denied his advair. Pt understands and states that he will call his insurance tmrw

## 2019-10-04 NOTE — Therapy (Signed)
[facility] [address] [place], [place], [address] Phone: [phone]   Fax:  [phone]  Physical Therapy Treatment  Patient Details  Name: [name] MRN: [id number] Date of Birth: Aug 23, 1946 Referring Provider (PT): Dr Dorothyann Peng    Encounter Date: 10/04/2019  PT End of Session - 10/04/19 1149    Visit Number  3    Number of Visits  12    Date for PT Re-Evaluation  11/02/19    Authorization Type  Aetna Medicare    PT Start Time  1015    PT Stop Time  1114    PT Time Calculation (min)  59 min    Activity Tolerance  Patient tolerated treatment well    Behavior During Therapy  [facility] for tasks assessed/performed       Past Medical History:  Diagnosis Date  . Chronic renal insufficiency    WITH BASELINE CREATININE OF 1.6  . Chronic systolic CHF (congestive heart failure) (HCC)   . Complete heart block (HCC)    s/p PPM by Dr Reyes Ivan  . Coronary disease    NONOBSTRUCTIVE  . GERD (gastroesophageal reflux disease)   . Gout   . Hyperkalemia   . Hypertension   . Nonischemic cardiomyopathy (HCC)   . Presence of permanent cardiac pacemaker     Past Surgical History:  Procedure Laterality Date  . CARDIAC CATHETERIZATION  08/2013  . EP IMPLANTABLE DEVICE N/A 02/15/2015   Procedure: Lead Revision/Repair;  Surgeon: Hillis Range, MD;  [facility]: [facility] INVASIVE CV LAB;  Service: Cardiovascular;  Laterality: N/A;  . INGUINAL HERNIA REPAIR Bilateral   . INSERT / REPLACE / REMOVE PACEMAKER  05/20/2009   MDT by Dr Reyes Ivan for complete heart block  . LEFT AND RIGHT HEART CATHETERIZATION WITH CORONARY ANGIOGRAM N/A 08/16/2013   Procedure: LEFT AND RIGHT HEART CATHETERIZATION WITH CORONARY ANGIOGRAM;  Surgeon: [name] Swaziland, MD;  Location: [facility] CATH LAB;  Service: Cardiovascular;  Laterality: N/A;    There were no vitals filed for this visit.  Subjective Assessment - 10/04/19 1143    Subjective  Patient reports his neck has been  very painful for 4-5 dsays. He is having difficulty sleeping. He has pain across both shoulders and down into his neck. he is very frusterated by the return of his neck pain    Pertinent History  CHF, Pacemaker    Limitations  Lifting;Sitting;Walking;Standing    How long can you sit comfortably?  depends    How long can you stand comfortably?  increased back pain with prolonged standing    How long can you walk comfortably?  depends on the day    Diagnostic tests  Neck CT: degeneration at c5-c6 C6 C-7    Patient Stated Goals  to have less pain/ to have his a program to work on    Currently in Pain?  Yes    Pain Score  8     Pain Location  Neck    Pain Orientation  Right;Left    Pain Descriptors / Indicators  Aching    Pain Type  Chronic pain    Pain Radiating Towards  down right arm    Pain Onset  More than a month ago    Pain Frequency  Intermittent    Aggravating Factors   turning her head    Pain Relieving Factors  rest    Effect of Pain on Daily Activities  difficulty turning her head  [facility] Adult PT Treatment/Exercise - 10/04/19 0001      Neck Exercises: Standing   Other Standing Exercises  scap retraction 2x10 yellow ; shoulder extension x20 yellow       Manual Therapy   Manual Therapy  Joint mobilization;Soft tissue mobilization;Manual Traction    Manual therapy comments  skilled palaption of trigger points.     Soft tissue mobilization  IASTYM to bilateral upper traps; manual trigget point release to bilateral paraspinals    Manual Traction  genetle manual traction; sub ocicpaital release      Neck Exercises: Stretches   Upper Trapezius Stretch  2 reps;20 seconds;Right;Left   reviewed for HEP    Levator Stretch  2 reps;20 seconds;30 seconds;Right;Left   reviewed for HEP       Trigger Point Dry Needling - 10/04/19 0001    Consent Given?  Yes    Education Handout Provided  Yes    Muscles Treated Head and Neck  Upper  trapezius;Cervical multifidi    Other Dry Needling  right cervical multifidi C6; 2 spots in right upper trap 1 in Left    Upper Trapezius Response  Twitch reponse elicited;Palpable increased muscle length           PT Education - 10/04/19 1146    Education Details  benefits and risks of TPDN    Person(s) Educated  Patient    Methods  Explanation;Tactile cues;Demonstration;Verbal cues    Comprehension  Verbalized understanding;Returned demonstration;Verbal cues required;Tactile cues required       PT Short Term Goals - 10/04/19 1211      PT SHORT TERM GOAL #1   Title  Patient will increase gross  right shoulder strength to 4+/5    Time  3    Period  Weeks    Status  On-going    Target Date  10/12/19      PT SHORT TERM GOAL #2   Title  Patient will increase cervical rotation by 10 degrees bilateral    Time  3    Period  Weeks    Status  New    Target Date  10/12/19      PT SHORT TERM GOAL #3   Title  Patient will increase lumbar flexion to 65 degrees without pain    Time  3    Period  Weeks    Status  On-going    Target Date  10/12/19        PT Long Term Goals - 09/21/19 1409      PT LONG TERM GOAL #1   Title  Patient will have a complete program for his necka and an updated program for lower back mangement    Time  6    Period  Weeks    Status  New    Target Date  11/02/19      PT LONG TERM GOAL #2   Title  Patient will put shirt on without pain    Time  6    Period  Weeks    Status  New    Target Date  11/02/19      PT LONG TERM GOAL #3   Title  Patient will put his socks on without shoulder and back pain    Time  6    Period  Weeks    Status  New    Target Date  11/02/19            Plan - 10/04/19 1150    Clinical  Impression Statement  Patient had a great twitch respose to trigger point dry needling. He was advised to continue working on stretching and light strengthening at home. He had improved tightness with needling and IASTYM to upper  traps. He was advised to try to avoid painful sleeping positions. He is furstrated byt the pain but he was encouraged that the last visit we focused on his back because his neck was better. We havent really focsued on his neck. Therapy also applied heat. Patient continues to want E-stim but he was adivised therapy will not perfrom with his pacemaker. Therapy will advance exercises as tolerated.    Personal Factors and Comorbidities  Comorbidity 1    Comorbidities  pacemaker, CHF,    Examination-Activity Limitations  Bend;Lift;Hygiene/Grooming;Carry    Stability/Clinical Decision Making  Evolving/Moderate complexity    Clinical Decision Making  Moderate    Rehab Potential  Good    PT Frequency  2x / week    PT Duration  6 weeks    PT Treatment/Interventions  ADLs/Self Care Home Management;Cryotherapy;Moist Heat;Gait training;Therapeutic activities;Therapeutic exercise;Neuromuscular re-education;Patient/family education;Manual techniques;Passive range of motion;Dry needling;Taping    PT Next Visit Plan  patient can not lie supine at this time. Consider semi-reccumbet; patient has a pacemaker, gentle passive ROM of bilateral LE trigger point dry needling if we can position him correctly; LAD to lumbar spine. give lumbar stretches; consider seated core stability Assess tolerance to TPDN.    PT Home Exercise Plan  upper trap stretch, levator stretch, tennis ball trigger point release; scpa retraction    Consulted and Agree with Plan of Care  Patient       Patient will benefit from skilled therapeutic intervention in order to improve the following deficits and impairments:  Abnormal gait, Decreased endurance, Decreased activity tolerance, Pain, Impaired UE functional use, Increased fascial restricitons, Decreased range of motion, Increased muscle spasms  Visit Diagnosis: Cervicalgia  Cramp and spasm  Chronic left-sided low back pain with left-sided sciatica     Problem List Patient Active  Problem List   Diagnosis Date Noted  . Pacemaker lead fracture 02/14/2015  . Pacemaker malfunction 02/14/2015  . Chronic systolic CHF (congestive heart failure) ([place]) 08/11/2013  . Dyspnea 07/08/2013  . CKD (chronic kidney disease), stage III   . Complete heart block ([place]) 03/21/2011  . Hypertension 03/21/2011  . Hyperkalemia 03/21/2011    Carney Living PT DPT  10/04/2019, 12:16 PM  [facility] [address] [place], [place], [address] Phone: [phone]   Fax:  [phone]  Name: [name] MRN: [id number] Date of Birth: 12-19-1945

## 2019-10-06 ENCOUNTER — Other Ambulatory Visit: Payer: Self-pay | Admitting: Internal Medicine

## 2019-10-06 ENCOUNTER — Other Ambulatory Visit: Payer: Self-pay

## 2019-10-06 ENCOUNTER — Ambulatory Visit: Payer: Medicare HMO | Admitting: Physical Therapy

## 2019-10-06 ENCOUNTER — Encounter: Payer: Self-pay | Admitting: Physical Therapy

## 2019-10-06 DIAGNOSIS — R252 Cramp and spasm: Secondary | ICD-10-CM

## 2019-10-06 DIAGNOSIS — M542 Cervicalgia: Secondary | ICD-10-CM | POA: Diagnosis not present

## 2019-10-06 DIAGNOSIS — G8929 Other chronic pain: Secondary | ICD-10-CM | POA: Diagnosis not present

## 2019-10-06 DIAGNOSIS — M5442 Lumbago with sciatica, left side: Secondary | ICD-10-CM | POA: Diagnosis not present

## 2019-10-07 ENCOUNTER — Encounter: Payer: Self-pay | Admitting: Physical Therapy

## 2019-10-07 NOTE — Therapy (Signed)
[name], [place], [address] Phone: [phone]   Fax:  [phone]  Physical Therapy Treatment  Patient Details  Name: [name] MRN: [id number] Date of Birth: Mar 24, 1946 Referring Provider (PT): Dr [name]    Encounter Date: 10/06/2019  PT End of Session - 10/07/19 0808    Visit Number  4    Number of Visits  12    Date for PT Re-Evaluation  11/02/19    Authorization Type  Aetna Medicare    PT Start Time  1324    PT Stop Time  1106    PT Time Calculation (min)  51 min    Activity Tolerance  Patient tolerated treatment well    Behavior During Therapy  [facility] for tasks assessed/performed       Past Medical History:  Diagnosis Date  . Chronic renal insufficiency    WITH BASELINE CREATININE OF 1.6  . Chronic systolic CHF (congestive heart failure) ([place])   . Complete heart block (HCC)    s/p PPM by Dr [name] Setting  . Coronary disease    NONOBSTRUCTIVE  . GERD (gastroesophageal reflux disease)   . Gout   . Hyperkalemia   . Hypertension   . Nonischemic cardiomyopathy ([place])   . Presence of permanent cardiac pacemaker     Past Surgical History:  Procedure Laterality Date  . CARDIAC CATHETERIZATION  08/2013  . EP IMPLANTABLE DEVICE N/A 02/15/2015   Procedure: Lead Revision/Repair;  Surgeon: Gregory Grayer, MD;  Location: [place] CV LAB;  Service: Cardiovascular;  Laterality: N/A;  . INGUINAL HERNIA REPAIR Bilateral   . INSERT / REPLACE / REMOVE PACEMAKER  05/20/2009   MDT by Dr [name] Setting for complete heart block  . LEFT AND RIGHT HEART CATHETERIZATION WITH CORONARY ANGIOGRAM N/A 08/16/2013   Procedure: LEFT AND RIGHT HEART CATHETERIZATION WITH CORONARY ANGIOGRAM;  Surgeon: Gregory M Martinique, MD;  Location: [facility] CATH LAB;  Service: Cardiovascular;  Laterality: N/A;    There were no vitals filed for this visit.  Subjective Assessment - 10/06/19 1037    Subjective  Patient reports it is better and  looser but he is still having difficulty sleeping at night. Heis having increased pain this morning.    Pertinent History  CHF, Pacemaker    Limitations  Lifting;Sitting;Walking;Standing    How long can you sit comfortably?  depends    How long can you stand comfortably?  increased back pain with prolonged standing    How long can you walk comfortably?  depends on the day    Diagnostic tests  Neck CT: degeneration at c5-c6 C6 C-7    Patient Stated Goals  to have less pain/ to have his a program to work on    Currently in Pain?  Yes    Pain Score  7     Pain Location  Neck    Pain Orientation  Right;Left    Pain Descriptors / Indicators  Aching    Pain Type  Chronic pain    Pain Onset  More than a month ago    Pain Frequency  Intermittent    Aggravating Factors   turning his head    Pain Relieving Factors  rest    Effect of Pain on Daily Activities  difficulty turning his head                       [facility] Adult PT Treatment/Exercise - 10/07/19 0001  Self-Care   Self-Care  Other Self-Care Comments    Other Self-Care Comments   reviewed trigger point release with a thera-cane; reviewed were to buy cane.  Use of a ppillow, head position sleeping       Neck Exercises: Standing   Other Standing Exercises  scap retraction 2x10 yellow ; shoulder extension x20 yellow       Modalities   Modalities  Moist Heat      Moist Heat Therapy   Number Minutes Moist Heat  10 Minutes    Moist Heat Location  Cervical      Manual Therapy   Manual Therapy  Joint mobilization;Soft tissue mobilization;Manual Traction    Manual therapy comments  skilled palaption of trigger points.     Soft tissue mobilization  IASTYM to bilateral upper traps; manual trigget point release to bilateral paraspinals    Manual Traction  genetle manual traction; sub ocicpaital release      Neck Exercises: Stretches   Upper Trapezius Stretch  2 reps;20 seconds;Right;Left   reviewed for HEP    Levator  Stretch  2 reps;20 seconds;30 seconds;Right;Left   reviewed for HEP             PT Education - 10/07/19 0801    Education Details  reviewed HEP and symptom mangement    Person(s) Educated  Patient    Methods  Explanation;Demonstration;Tactile cues;Verbal cues    Comprehension  Verbalized understanding;Returned demonstration;Verbal cues required;Tactile cues required       PT Short Term Goals - 10/07/19 0826      PT SHORT TERM GOAL #1   Title  Patient will increase gross  right shoulder strength to 4+/5    Time  3    Period  Weeks    Status  On-going    Target Date  10/12/19      PT SHORT TERM GOAL #2   Title  Patient will increase cervical rotation by 10 degrees bilateral    Time  3    Period  Weeks    Status  On-going    Target Date  10/12/19      PT SHORT TERM GOAL #3   Title  Patient will increase lumbar flexion to 65 degrees without pain    Time  3    Period  Weeks    Status  On-going    Target Date  10/12/19        PT Long Term Goals - 09/21/19 1409      PT LONG TERM GOAL #1   Title  Patient will have a complete program for his necka and an updated program for lower back mangement    Time  6    Period  Weeks    Status  New    Target Date  11/02/19      PT LONG TERM GOAL #2   Title  Patient will put shirt on without pain    Time  6    Period  Weeks    Status  New    Target Date  11/02/19      PT LONG TERM GOAL #3   Title  Patient will put his socks on without shoulder and back pain    Time  6    Period  Weeks    Status  New    Target Date  11/02/19            Plan - 10/07/19 0809    Clinical Impression Statement  Patient  had a significant improvement in mudcle tightness compared to the last visit. He continues to have spasming though. Patient continues to work on stretches and exercises. He was shown where to buy a thera-cane and how to use it. He is sleeping with no pillow at this time. He reports that it is the only way he can sllep.  Patient was remined that he reports he has not slpet in 5 days. Sleeping with no pillow is likley causing his head to be in a bad position.    Personal Factors and Comorbidities  Comorbidity 1    Comorbidities  pacemaker, CHF,    Examination-Activity Limitations  Bend;Lift;Hygiene/Grooming;Carry       Patient will benefit from skilled therapeutic intervention in order to improve the following deficits and impairments:     Visit Diagnosis: Cervicalgia  Cramp and spasm  Chronic left-sided low back pain with left-sided sciatica     Problem List Patient Active Problem List   Diagnosis Date Noted  . Pacemaker lead fracture 02/14/2015  . Pacemaker malfunction 02/14/2015  . Chronic systolic CHF (congestive heart failure) (HCC) 08/11/2013  . Dyspnea 07/08/2013  . CKD (chronic kidney disease), stage III   . Complete heart block (HCC) 03/21/2011  . Hypertension 03/21/2011  . Hyperkalemia 03/21/2011    Dessie Coma PT DPT  10/07/2019, 8:28 AM  [facility] [address] [place], [place], [address] Phone: [phone]   Fax:  [phone]  Name: JETHRO RADKE MRN: [id number] Date of Birth: 1946-01-20

## 2019-10-11 ENCOUNTER — Ambulatory Visit: Payer: Medicare HMO | Admitting: Physical Therapy

## 2019-10-13 ENCOUNTER — Ambulatory Visit: Payer: Medicare HMO | Admitting: Physical Therapy

## 2019-10-18 ENCOUNTER — Ambulatory Visit: Payer: Medicare HMO | Attending: Internal Medicine | Admitting: Physical Therapy

## 2019-10-18 ENCOUNTER — Encounter: Payer: Self-pay | Admitting: Physical Therapy

## 2019-10-18 ENCOUNTER — Other Ambulatory Visit: Payer: Self-pay

## 2019-10-18 DIAGNOSIS — G8929 Other chronic pain: Secondary | ICD-10-CM | POA: Diagnosis not present

## 2019-10-18 DIAGNOSIS — M542 Cervicalgia: Secondary | ICD-10-CM

## 2019-10-18 DIAGNOSIS — M5442 Lumbago with sciatica, left side: Secondary | ICD-10-CM | POA: Diagnosis not present

## 2019-10-18 DIAGNOSIS — R252 Cramp and spasm: Secondary | ICD-10-CM | POA: Diagnosis not present

## 2019-10-19 ENCOUNTER — Encounter: Payer: Self-pay | Admitting: Physical Therapy

## 2019-10-19 NOTE — Therapy (Signed)
[facility] [address] [place], [place], [address] Phone: [phone]   Fax:  [phone]  Physical Therapy Treatment  Patient Details  Name: [name] MRN: [id number] Date of Birth: 11/28/1945 Referring Provider (PT): Dr Dorothyann Peng    Encounter Date: 10/18/2019  PT End of Session - 10/18/19 1019    Visit Number  5    Number of Visits  12    Date for PT Re-Evaluation  11/02/19    Authorization Type  A[employer]na Medicare    PT Start Time  1015    PT Stop Time  1107    PT Time Calculation (min)  52 min    Activity Tolerance  Patient tolerated treatment well    Behavior During Therapy  [facility] for tasks assessed/performed       Past Medical History:  Diagnosis Date  . Chronic renal insufficiency    WITH BASELINE CREATININE OF 1.6  . Chronic systolic CHF (congestive heart failure) (HCC)   . Complete heart block (HCC)    s/p PPM by Dr Reyes Ivan  . Coronary disease    NONOBSTRUCTIVE  . GERD (gastroesophageal reflux disease)   . Gout   . Hyperkalemia   . Hypertension   . Nonischemic cardiomyopathy (HCC)   . Presence of permanent cardiac pacemaker     Past Surgical History:  Procedure Laterality Date  . CARDIAC CATHETERIZATION  08/2013  . EP IMPLANTABLE DEVICE N/A 02/15/2015   Procedure: Lead Revision/Repair;  Surgeon: Hillis Range, MD;  [facility]: [facility] INVASIVE CV LAB;  Service: Cardiovascular;  Laterality: N/A;  . INGUINAL HERNIA REPAIR Bilateral   . INSERT / REPLACE / REMOVE PACEMAKER  05/20/2009   MDT by Dr Reyes Ivan for complete heart block  . LEFT AND RIGHT HEART CATHETERIZATION WITH CORONARY ANGIOGRAM N/A 08/16/2013   Procedure: LEFT AND RIGHT HEART CATHETERIZATION WITH CORONARY ANGIOGRAM;  Surgeon: [name] Swaziland, MD;  Location: [facility] CATH LAB;  Service: Cardiovascular;  Laterality: N/A;    There were no vitals filed for this visit.  Subjective Assessment - 10/18/19 1017    Subjective  Patient reports his neck is feeling  better but today he is having significant pain in his right shoulder.    Pertinent History  CHF, Pacemaker    Limitations  Lifting;Sitting;Walking;Standing    How long can you sit comfortably?  depends    How long can you stand comfortably?  increased back pain with prolonged standing    How long can you walk comfortably?  depends on the day    Diagnostic tests  Neck CT: degeneration at c5-c6 C6 C-7    Patient Stated Goals  to have less pain/ to have his a program to work on    Currently in Pain?  Yes    Pain Score  6     Pain Location  Neck    Pain Orientation  Right;Left    Pain Descriptors / Indicators  Aching    Pain Type  Chronic pain    Pain Onset  More than a month ago    Pain Frequency  Intermittent    Aggravating Factors   turning his head    Pain Relieving Factors  rest    Effect of Pain on Daily Activities  difficulty turning his head                       [facility] Adult PT Treatment/Exercise - 10/19/19 0001      Lumbar Exercises:  Stretches   Active Hamstring Stretch Limitations  3x20 sec hold bilateral seated     Lower Trunk Rotation  30 seconds    Piriformis Stretch Limitations  3x20 sec hold bilateral       Lumbar Exercises: Supine   AB Set Limitations  reviewed for HEP     Clam Limitations  x20 green     Bent Knee Raise Limitations  x15     Bridge  20 reps      Moist Heat Therapy   Number Minutes Moist Heat  10 Minutes    Moist Heat Location  Cervical;Lumbar Spine      Manual Therapy   Manual Therapy  Joint mobilization;Soft tissue mobilization;Manual Traction    Manual therapy comments  skilled palaption of trigger points.     Joint Mobilization  posterior shoulder and posterior hip mobilization     Soft tissue mobilization  IASTYM to bilateral upper traps; manual trigget point release to bilateral paraspinals    Manual Traction  genetle manual traction; sub ocicpaital release      Neck Exercises: Stretches   Upper Trapezius Stretch  2  reps;20 seconds;Right;Left   reviewed for HEP    Levator Stretch  2 reps;20 seconds;30 seconds;Right;Left   reviewed for HEP             PT Education - 10/18/19 1019    Education Details  reviewed HEP and symptom mangement    Person(s) Educated  Patient    Methods  Explanation;Demonstration;Tactile cues;Verbal cues    Comprehension  Returned demonstration;Verbal cues required;Verbalized understanding       PT Short Term Goals - 10/19/19 1300      PT SHORT TERM GOAL #1   Title  Patient will increase gross  right shoulder strength to 4+/5    Time  3    Period  Weeks    Status  On-going    Target Date  10/12/19      PT SHORT TERM GOAL #2   Title  Patient will increase cervical rotation by 10 degrees bilateral    Time  3    Period  Weeks    Status  On-going    Target Date  10/12/19      PT SHORT TERM GOAL #3   Title  Patient will increase lumbar flexion to 65 degrees without pain    Time  3    Period  Weeks    Status  On-going    Target Date  10/12/19        PT Long Term Goals - 09/21/19 1409      PT LONG TERM GOAL #1   Title  Patient will have a complete program for his necka and an updated program for lower back mangement    Time  6    Period  Weeks    Status  New    Target Date  11/02/19      PT LONG TERM GOAL #2   Title  Patient will put shirt on without pain    Time  6    Period  Weeks    Status  New    Target Date  11/02/19      PT LONG TERM GOAL #3   Title  Patient will put his socks on without shoulder and back pain    Time  6    Period  Weeks    Status  New    Target Date  11/02/19  Plan - 10/18/19 1047    Clinical Impression Statement  Patients neck has improved but he has pain in his anterior shoulder and lower back. He had full passive motion of his shoulder but pain in his biceps groove and AC joint and limited active motion. Therapy performed joint mobilization to improve movement and decrease pain. He reported improved  pain with manual therapy to his back and shoulder. He continues to request tens and ultrasuound. Both are contrindicated with a pace maker and would not be overly beneficial to his contintion in the first place. Therapy will continue to progress manual therapy and ther-ex as tolerated.    Personal Factors and Comorbidities  Comorbidity 1    Comorbidities  pacemaker, CHF,    Examination-Activity Limitations  Bend;Lift;Hygiene/Grooming;Carry    Examination-Participation Restrictions  Meal Prep;Shop;Laundry    Stability/Clinical Decision Making  Evolving/Moderate complexity    Clinical Decision Making  Moderate    Rehab Potential  Good    PT Frequency  2x / week    PT Duration  6 weeks    PT Treatment/Interventions  ADLs/Self Care Home Management;Cryotherapy;Moist Heat;Gait training;Therapeutic activities;Therapeutic exercise;Neuromuscular re-education;Patient/family education;Manual techniques;Passive range of motion;Dry needling;Taping    PT Next Visit Plan  patient can not lie supine at this time. Consider semi-reccumbet; patient has a pacemaker, gentle passive ROM of bilateral LE trigger point dry needling if we can position him correctly; LAD to lumbar spine. give lumbar stretches; consider seated core stability Assess tolerance to TPDN.    PT Home Exercise Plan  upper trap stretch, levator stretch, tennis ball trigger point release; scpa retraction    Consulted and Agree with Plan of Care  Patient       Patient will benefit from skilled therapeutic intervention in order to improve the following deficits and impairments:  Abnormal gait, Decreased endurance, Decreased activity tolerance, Pain, Impaired UE functional use, Increased fascial restricitons, Decreased range of motion, Increased muscle spasms  Visit Diagnosis: Cervicalgia  Cramp and spasm  Chronic left-sided low back pain with left-sided sciatica     Problem List Patient Active Problem List   Diagnosis Date Noted  .  Pacemaker lead fracture 02/14/2015  . Pacemaker malfunction 02/14/2015  . Chronic systolic CHF (congestive heart failure) ([place]) 08/11/2013  . Dyspnea 07/08/2013  . CKD (chronic kidney disease), stage III   . Complete heart block ([place]) 03/21/2011  . Hypertension 03/21/2011  . Hyperkalemia 03/21/2011    Carney Living PT DPT  10/19/2019, 1:04 PM  [facility] [address] [place], [place], [address] Phone: [phone]   Fax:  [phone]  Name: [name] MRN: [id number] Date of Birth: 05/25/1946

## 2019-10-20 ENCOUNTER — Telehealth: Payer: Self-pay

## 2019-10-20 ENCOUNTER — Ambulatory Visit: Payer: Medicare HMO | Admitting: Physical Therapy

## 2019-10-20 NOTE — Telephone Encounter (Signed)
Left message for pt to return call. Need to know what insurance said about covering advair.

## 2019-10-21 ENCOUNTER — Other Ambulatory Visit: Payer: Self-pay

## 2019-10-21 ENCOUNTER — Ambulatory Visit: Payer: Medicare HMO | Admitting: Physical Therapy

## 2019-10-21 ENCOUNTER — Encounter: Payer: Self-pay | Admitting: Physical Therapy

## 2019-10-21 DIAGNOSIS — R252 Cramp and spasm: Secondary | ICD-10-CM

## 2019-10-21 DIAGNOSIS — M542 Cervicalgia: Secondary | ICD-10-CM | POA: Diagnosis not present

## 2019-10-21 DIAGNOSIS — G8929 Other chronic pain: Secondary | ICD-10-CM

## 2019-10-21 DIAGNOSIS — M5442 Lumbago with sciatica, left side: Secondary | ICD-10-CM | POA: Diagnosis not present

## 2019-10-21 NOTE — Therapy (Signed)
[place], [place], [address] Phone: [phone]   Fax:  [phone]  Physical Therapy Treatment  Patient Details  Name: [name] MRN: [id number] Date of Birth: 1946-09-07 Referring Provider (PT): Dr Glendale Chard    Encounter Date: 10/21/2019  PT End of Session - 10/21/19 1243    Visit Number  6    Number of Visits  12    Date for PT Re-Evaluation  11/02/19    Authorization Type  Aetna Medicare    PT Start Time  1100    PT Stop Time  1150    PT Time Calculation (min)  50 min    Activity Tolerance  Patient tolerated treatment well    Behavior During Therapy  [facility] for tasks assessed/performed       Past Medical History:  Diagnosis Date  . Chronic renal insufficiency    WITH BASELINE CREATININE OF 1.6  . Chronic systolic CHF (congestive heart failure) ([place])   . Complete heart block (HCC)    s/p PPM by Dr Verlon Setting  . Coronary disease    NONOBSTRUCTIVE  . GERD (gastroesophageal reflux disease)   . Gout   . Hyperkalemia   . Hypertension   . Nonischemic cardiomyopathy ([place])   . Presence of permanent cardiac pacemaker     Past Surgical History:  Procedure Laterality Date  . CARDIAC CATHETERIZATION  08/2013  . EP IMPLANTABLE DEVICE N/A 02/15/2015   Procedure: Lead Revision/Repair;  Surgeon: Thompson Grayer, MD;  Location: [place] CV LAB;  Service: Cardiovascular;  Laterality: N/A;  . INGUINAL HERNIA REPAIR Bilateral   . INSERT / REPLACE / REMOVE PACEMAKER  05/20/2009   MDT by Dr Verlon Setting for complete heart block  . LEFT AND RIGHT HEART CATHETERIZATION WITH CORONARY ANGIOGRAM N/A 08/16/2013   Procedure: LEFT AND RIGHT HEART CATHETERIZATION WITH CORONARY ANGIOGRAM;  Surgeon: [name] Martinique, MD;  Location: [facility] CATH LAB;  Service: Cardiovascular;  Laterality: N/A;    There were no vitals filed for this visit.  Subjective Assessment - 10/21/19 1241    Subjective  Patient initially agitated about  covid questions. Patient talked with office manager and understands. He did not sleep last night but his neck has done well. He has minor pain in his neck and back. No pain this morning.    Currently in Pain?  No/denies                       [facility] Adult PT Treatment/Exercise - 10/21/19 0001      Self-Care   Other Self-Care Comments   reviewed sleep hygien techniques       Neck Exercises: Seated   Other Seated Exercise  bilateral er red x20     Other Seated Exercise  bilateral horizontal abduction 2x10       Moist Heat Therapy   Number Minutes Moist Heat  -10 Minutes    Moist Heat Location  Cervical;Lumbar Spine      Manual Therapy   Manual Therapy  Joint mobilization;Soft tissue mobilization;Manual Traction    Manual therapy comments  skilled palaption of trigger points.     Joint Mobilization  posterior shoulder and posterior hip mobilization     Soft tissue mobilization  IASTYM to bilateral upper traps; manual trigget point release to bilateral paraspinals    Manual Traction  genetle manual traction; sub ocicpaital release      Neck Exercises: Stretches   Upper Trapezius Stretch  2 reps;20 seconds;Right;Left   reviewed for HEP    Levator Stretch  2 reps;20 seconds;30 seconds;Right;Left   reviewed for HEP             PT Education - 10/21/19 1242    Education Details  updated HEP; sleep hygien form given       PT Short Term Goals - 10/19/19 1300      PT SHORT TERM GOAL #1   Title  Patient will increase gross  right shoulder strength to 4+/5    Time  3    Period  Weeks    Status  On-going    Target Date  10/12/19      PT SHORT TERM GOAL #2   Title  Patient will increase cervical rotation by 10 degrees bilateral    Time  3    Period  Weeks    Status  On-going    Target Date  10/12/19      PT SHORT TERM GOAL #3   Title  Patient will increase lumbar flexion to 65 degrees without pain    Time  3    Period  Weeks    Status  On-going    Target  Date  10/12/19        PT Long Term Goals - 09/21/19 1409      PT LONG TERM GOAL #1   Title  Patient will have a complete program for his necka and an updated program for lower back mangement    Time  6    Period  Weeks    Status  New    Target Date  11/02/19      PT LONG TERM GOAL #2   Title  Patient will put shirt on without pain    Time  6    Period  Weeks    Status  New    Target Date  11/02/19      PT LONG TERM GOAL #3   Title  Patient will put his socks on without shoulder and back pain    Time  6    Period  Weeks    Status  New    Target Date  11/02/19            Plan - 10/21/19 1243    Clinical Impression Statement  Patient is making good progress. His spasming has improved. Therapy reviewed sleep hygien with the patient. He was also given an udated HEP.    Personal Factors and Comorbidities  Comorbidity 1    Comorbidities  pacemaker, CHF,    Examination-Activity Limitations  Bend;Lift;Hygiene/Grooming;Carry    Examination-Participation Restrictions  Meal Prep;Shop;Laundry    Stability/Clinical Decision Making  Evolving/Moderate complexity    Clinical Decision Making  Moderate    Rehab Potential  Good    PT Duration  6 weeks    PT Treatment/Interventions  ADLs/Self Care Home Management;Cryotherapy;Moist Heat;Gait training;Therapeutic activities;Therapeutic exercise;Neuromuscular re-education;Patient/family education;Manual techniques;Passive range of motion;Dry needling;Taping    PT Next Visit Plan  patient can not lie supine at this time. Consider semi-reccumbet; patient has a pacemaker, gentle passive ROM of bilateral LE trigger point dry needling if we can position him correctly; LAD to lumbar spine. give lumbar stretches; consider seated core stability Assess tolerance to TPDN.    PT Home Exercise Plan  upper trap stretch, levator stretch, tennis ball trigger point release; scpa retraction    Consulted and Agree with Plan of Care  Patient       Patient  will benefit from skilled therapeutic intervention in order to improve the following deficits and impairments:  Abnormal gait, Decreased endurance, Decreased activity tolerance, Pain, Impaired UE functional use, Increased fascial restricitons, Decreased range of motion, Increased muscle spasms  Visit Diagnosis: Cervicalgia  Cramp and spasm  Chronic left-sided low back pain with left-sided sciatica     Problem List Patient Active Problem List   Diagnosis Date Noted  . Pacemaker lead fracture 02/14/2015  . Pacemaker malfunction 02/14/2015  . Chronic systolic CHF (congestive heart failure) (HCC) 08/11/2013  . Dyspnea 07/08/2013  . CKD (chronic kidney disease), stage III   . Complete heart block (HCC) 03/21/2011  . Hypertension 03/21/2011  . Hyperkalemia 03/21/2011    Dessie Coma PT DPT  10/21/2019, 12:46 PM  [facility] [address] [place], [place], [address] Phone: [phone]   Fax:  [phone]  Name: [name] MRN: [id number] Date of Birth: 03-21-1946

## 2019-10-24 DIAGNOSIS — R69 Illness, unspecified: Secondary | ICD-10-CM | POA: Diagnosis not present

## 2019-10-25 ENCOUNTER — Ambulatory Visit: Payer: Medicare HMO | Admitting: Physical Therapy

## 2019-10-27 ENCOUNTER — Ambulatory Visit: Payer: Medicare HMO | Admitting: Physical Therapy

## 2019-11-01 ENCOUNTER — Other Ambulatory Visit: Payer: Self-pay

## 2019-11-01 ENCOUNTER — Encounter: Payer: Self-pay | Admitting: Physical Therapy

## 2019-11-01 ENCOUNTER — Ambulatory Visit: Payer: Medicare HMO | Admitting: Physical Therapy

## 2019-11-01 DIAGNOSIS — M542 Cervicalgia: Secondary | ICD-10-CM

## 2019-11-01 DIAGNOSIS — R252 Cramp and spasm: Secondary | ICD-10-CM | POA: Diagnosis not present

## 2019-11-01 DIAGNOSIS — G8929 Other chronic pain: Secondary | ICD-10-CM | POA: Diagnosis not present

## 2019-11-01 DIAGNOSIS — M5442 Lumbago with sciatica, left side: Secondary | ICD-10-CM | POA: Diagnosis not present

## 2019-11-02 ENCOUNTER — Encounter: Payer: Self-pay | Admitting: Physical Therapy

## 2019-11-02 NOTE — Therapy (Addendum)
[place], [place], [address] Phone: [phone]   Fax:  [phone]  Physical Therapy Treatment/Discharge   Patient Details  Name: GAGAN DILLION MRN: [id number] Date of Birth: Jul 27, 1946 Referring Provider (PT): Dr Glendale Chard    Encounter Date: 11/01/2019  PT End of Session - 11/01/19 1020    Visit Number  7    Number of Visits  12    Date for PT Re-Evaluation  11/02/19    Authorization Type  Aetna Medicare    PT Start Time  6270    PT Stop Time  1056    PT Time Calculation (min)  41 min    Activity Tolerance  Patient tolerated treatment well    Behavior During Therapy  [facility] for tasks assessed/performed       Past Medical History:  Diagnosis Date  . Chronic renal insufficiency    WITH BASELINE CREATININE OF 1.6  . Chronic systolic CHF (congestive heart failure) ([place])   . Complete heart block (HCC)    s/p PPM by Dr Verlon Setting  . Coronary disease    NONOBSTRUCTIVE  . GERD (gastroesophageal reflux disease)   . Gout   . Hyperkalemia   . Hypertension   . Nonischemic cardiomyopathy ([place])   . Presence of permanent cardiac pacemaker     Past Surgical History:  Procedure Laterality Date  . CARDIAC CATHETERIZATION  08/2013  . EP IMPLANTABLE DEVICE N/A 02/15/2015   Procedure: Lead Revision/Repair;  Surgeon: Thompson Grayer, MD;  Location: [place] CV LAB;  Service: Cardiovascular;  Laterality: N/A;  . INGUINAL HERNIA REPAIR Bilateral   . INSERT / REPLACE / REMOVE PACEMAKER  05/20/2009   MDT by Dr Verlon Setting for complete heart block  . LEFT AND RIGHT HEART CATHETERIZATION WITH CORONARY ANGIOGRAM N/A 08/16/2013   Procedure: LEFT AND RIGHT HEART CATHETERIZATION WITH CORONARY ANGIOGRAM;  Surgeon: [name] Martinique, MD;  Location: [facility] CATH LAB;  Service: Cardiovascular;  Laterality: N/A;    There were no vitals filed for this visit.  Subjective Assessment - 11/01/19 1018    Subjective  Patient has starteddoing  CBD pills and he isn;t having much pain. He is having minor pain in his shoulders but his neck and his back are doing well.    Pertinent History  CHF, Pacemaker    Limitations  Lifting;Sitting;Walking;Standing    How long can you sit comfortably?  depends    How long can you stand comfortably?  increased back pain with prolonged standing    How long can you walk comfortably?  depends on the day    Diagnostic tests  Neck CT: degeneration at c5-c6 C6 C-7    Patient Stated Goals  to have less pain/ to have his a program to work on    Currently in Pain?  --   minor non-specific pain in his shoulders ate times                      [facility] Adult PT Treatment/Exercise - 11/02/19 0001      Neck Exercises: Standing   Other Standing Exercises  Standing scap retraction x20; shoulder extension x20       Neck Exercises: Seated   Other Seated Exercise  bilateral er red x20     Other Seated Exercise  bilateral horizontal abduction 2x10       Neck Exercises: Supine   Other Supine Exercise  wand flexion x10  Manual Therapy   Joint Mobilization  PA and inferior glides grade II and III of the shoulder to imporve flexion.     Soft tissue mobilization  trigger point realse to upper traps and bilateral posterior shoulders     Manual Traction  genetle manual traction; sub ocicpaital release      Neck Exercises: Stretches   Upper Trapezius Stretch  2 reps;20 seconds;Right;Left   reviewed for HEP    Levator Stretch  2 reps;20 seconds;30 seconds;Right;Left   reviewed for HEP             PT Education - 11/01/19 1019    Education Details  reviewed HEP and stretches    Person(s) Educated  Patient    Methods  Explanation;Demonstration;Tactile cues;Verbal cues    Comprehension  Returned demonstration;Verbalized understanding;Verbal cues required;Tactile cues required       PT Short Term Goals - 10/19/19 1300      PT SHORT TERM GOAL #1   Title  Patient will increase gross   right shoulder strength to 4+/5    Time  3    Period  Weeks    Status  On-going    Target Date  10/12/19      PT SHORT TERM GOAL #2   Title  Patient will increase cervical rotation by 10 degrees bilateral    Time  3    Period  Weeks    Status  On-going    Target Date  10/12/19      PT SHORT TERM GOAL #3   Title  Patient will increase lumbar flexion to 65 degrees without pain    Time  3    Period  Weeks    Status  On-going    Target Date  10/12/19        PT Long Term Goals - 09/21/19 1409      PT LONG TERM GOAL #1   Title  Patient will have a complete program for his necka and an updated program for lower back mangement    Time  6    Period  Weeks    Status  New    Target Date  11/02/19      PT LONG TERM GOAL #2   Title  Patient will put shirt on without pain    Time  6    Period  Weeks    Status  New    Target Date  11/02/19      PT LONG TERM GOAL #3   Title  Patient will put his socks on without shoulder and back pain    Time  6    Period  Weeks    Status  New    Target Date  11/02/19            Plan - 11/01/19 1706    Clinical Impression Statement  Patiet is making progress. Today he had pain with end range shoulder motion but had full motion after manual therapy. He toleratd ther-ex well. therapy advcanced him to a green band. He is has all the exercises and stretches that he needs at this time. he would benefit from more manual therapy on his neck and shoulders but over the last few visits he has required less and less. He feels like this is because of the CBD oil but he paosture and spamsing have improved as well.    Stability/Clinical Decision Making  Evolving/Moderate complexity    Rehab Potential  Good    PT Frequency  2x / week    PT Duration  6 weeks    PT Treatment/Interventions  ADLs/Self Care Home Management;Cryotherapy;Moist Heat;Gait training;Therapeutic activities;Therapeutic exercise;Neuromuscular re-education;Patient/family education;Manual  techniques;Passive range of motion;Dry needling;Taping    PT Next Visit Plan  patient can not lie supine at this time. Consider semi-reccumbet; patient has a pacemaker, gentle passive ROM of bilateral LE trigger point dry needling if we can position him correctly; LAD to lumbar spine. give lumbar stretches; consider seated core stability Assess tolerance to TPDN.    PT Home Exercise Plan  upper trap stretch, levator stretch, tennis ball trigger point release; scpa retraction    Consulted and Agree with Plan of Care  Patient       Patient will benefit from skilled therapeutic intervention in order to improve the following deficits and impairments:  Abnormal gait, Decreased endurance, Decreased activity tolerance, Pain, Impaired UE functional use, Increased fascial restricitons, Decreased range of motion, Increased muscle spasms  Visit Diagnosis: Cervicalgia  Cramp and spasm  Chronic left-sided low back pain with left-sided sciatica     Problem List Patient Active Problem List   Diagnosis Date Noted  . Pacemaker lead fracture 02/14/2015  . Pacemaker malfunction 02/14/2015  . Chronic systolic CHF (congestive heart failure) (HCC) 08/11/2013  . Dyspnea 07/08/2013  . CKD (chronic kidney disease), stage III   . Complete heart block (HCC) 03/21/2011  . Hypertension 03/21/2011  . Hyperkalemia 03/21/2011    Dessie Coma PT DPT  11/02/2019, 1:40 PM  [facility] [address] [place], [place], [address] Phone: [phone]   Fax:  [phone]  Name: OSHAE SIMMERING MRN: [id number] Date of Birth: 1945/12/25

## 2019-11-03 ENCOUNTER — Ambulatory Visit: Payer: Medicare HMO | Admitting: Physical Therapy

## 2019-11-14 ENCOUNTER — Other Ambulatory Visit: Payer: Self-pay | Admitting: Internal Medicine

## 2019-11-21 DIAGNOSIS — R69 Illness, unspecified: Secondary | ICD-10-CM | POA: Diagnosis not present

## 2020-01-03 ENCOUNTER — Ambulatory Visit (INDEPENDENT_AMBULATORY_CARE_PROVIDER_SITE_OTHER): Payer: Medicare HMO | Admitting: *Deleted

## 2020-01-03 ENCOUNTER — Other Ambulatory Visit: Payer: Self-pay

## 2020-01-03 DIAGNOSIS — I442 Atrioventricular block, complete: Secondary | ICD-10-CM

## 2020-01-03 LAB — CUP PACEART INCLINIC DEVICE CHECK
Battery Remaining Longevity: 41 mo
Battery Voltage: 2.97 V
Brady Statistic AP VP Percent: 4.92 %
Brady Statistic AP VS Percent: 0 %
Brady Statistic AS VP Percent: 95 %
Brady Statistic AS VS Percent: 0.08 %
Brady Statistic RA Percent Paced: 4.84 %
Brady Statistic RV Percent Paced: 99.85 %
Date Time Interrogation Session: 20210427090517
Implantable Lead Implant Date: 20160609
Implantable Lead Implant Date: 20160609
Implantable Lead Implant Date: 20160609
Implantable Lead Location: 753858
Implantable Lead Location: 753859
Implantable Lead Location: 753860
Implantable Lead Model: 5076
Implantable Lead Model: 5076
Implantable Pulse Generator Implant Date: 20160609
Lead Channel Impedance Value: 361 Ohm
Lead Channel Impedance Value: 418 Ohm
Lead Channel Impedance Value: 418 Ohm
Lead Channel Impedance Value: 494 Ohm
Lead Channel Impedance Value: 513 Ohm
Lead Channel Impedance Value: 570 Ohm
Lead Channel Impedance Value: 646 Ohm
Lead Channel Impedance Value: 722 Ohm
Lead Channel Impedance Value: 893 Ohm
Lead Channel Pacing Threshold Amplitude: 0.75 V
Lead Channel Pacing Threshold Amplitude: 0.75 V
Lead Channel Pacing Threshold Amplitude: 2.125 V
Lead Channel Pacing Threshold Pulse Width: 0.4 ms
Lead Channel Pacing Threshold Pulse Width: 0.4 ms
Lead Channel Pacing Threshold Pulse Width: 0.4 ms
Lead Channel Sensing Intrinsic Amplitude: 14.25 mV
Lead Channel Sensing Intrinsic Amplitude: 14.25 mV
Lead Channel Sensing Intrinsic Amplitude: 2.5 mV
Lead Channel Sensing Intrinsic Amplitude: 3.125 mV
Lead Channel Setting Pacing Amplitude: 1.5 V
Lead Channel Setting Pacing Amplitude: 2.5 V
Lead Channel Setting Pacing Amplitude: 2.5 V
Lead Channel Setting Pacing Pulse Width: 0.4 ms
Lead Channel Setting Pacing Pulse Width: 0.4 ms
Lead Channel Setting Sensing Sensitivity: 8 mV

## 2020-01-03 NOTE — Progress Notes (Signed)
CRT-P device check in clinic. Normal device function. Thresholds, sensing, impedance consistent with previous measurements. Histograms right shifted, patient reports home BP monitoring WNL and compliance with Carvedilol, will review with MD. AMS episodes reviewed, appear false due to potential noise, unable to reproduce in-clinic, noted historical issue. AT/AF Burden 0.2%.  No ventricular high rate episodes. Patient bi-ventricularly pacing 99.8% of the time. Device programmed with appropriate safety margins. Device heart failure diagnostics are within normal limits and stable over time. Estimated longevity 3 years. Patient enrolled in remote follow-up. Plan to check device remotely in 3 months, next check scheduled for 03/28/20.  Follow-up in clinic with Dr. Johney Frame in October.

## 2020-01-13 DIAGNOSIS — N183 Chronic kidney disease, stage 3 unspecified: Secondary | ICD-10-CM | POA: Diagnosis not present

## 2020-01-13 DIAGNOSIS — I509 Heart failure, unspecified: Secondary | ICD-10-CM | POA: Diagnosis not present

## 2020-01-13 DIAGNOSIS — I129 Hypertensive chronic kidney disease with stage 1 through stage 4 chronic kidney disease, or unspecified chronic kidney disease: Secondary | ICD-10-CM | POA: Diagnosis not present

## 2020-01-13 DIAGNOSIS — N189 Chronic kidney disease, unspecified: Secondary | ICD-10-CM | POA: Diagnosis not present

## 2020-01-13 DIAGNOSIS — N2581 Secondary hyperparathyroidism of renal origin: Secondary | ICD-10-CM | POA: Diagnosis not present

## 2020-01-13 DIAGNOSIS — M109 Gout, unspecified: Secondary | ICD-10-CM | POA: Diagnosis not present

## 2020-02-20 ENCOUNTER — Ambulatory Visit (INDEPENDENT_AMBULATORY_CARE_PROVIDER_SITE_OTHER): Payer: Medicare HMO | Admitting: Internal Medicine

## 2020-02-20 ENCOUNTER — Encounter: Payer: Self-pay | Admitting: Internal Medicine

## 2020-02-20 ENCOUNTER — Other Ambulatory Visit: Payer: Self-pay

## 2020-02-20 VITALS — BP 118/66 | HR 86 | Temp 98.0°F | Wt 254.0 lb

## 2020-02-20 DIAGNOSIS — R7309 Other abnormal glucose: Secondary | ICD-10-CM | POA: Diagnosis not present

## 2020-02-20 DIAGNOSIS — M1A3490 Chronic gout due to renal impairment, unspecified hand, without tophus (tophi): Secondary | ICD-10-CM

## 2020-02-20 DIAGNOSIS — I5022 Chronic systolic (congestive) heart failure: Secondary | ICD-10-CM | POA: Diagnosis not present

## 2020-02-20 DIAGNOSIS — I442 Atrioventricular block, complete: Secondary | ICD-10-CM | POA: Diagnosis not present

## 2020-02-20 DIAGNOSIS — Z6841 Body Mass Index (BMI) 40.0 and over, adult: Secondary | ICD-10-CM

## 2020-02-20 DIAGNOSIS — I130 Hypertensive heart and chronic kidney disease with heart failure and stage 1 through stage 4 chronic kidney disease, or unspecified chronic kidney disease: Secondary | ICD-10-CM

## 2020-02-20 DIAGNOSIS — N183 Chronic kidney disease, stage 3 unspecified: Secondary | ICD-10-CM

## 2020-02-20 DIAGNOSIS — E7800 Pure hypercholesterolemia, unspecified: Secondary | ICD-10-CM

## 2020-02-20 NOTE — Progress Notes (Signed)
This visit occurred during the SARS-CoV-2 public health emergency.  Safety protocols were in place, including screening questions prior to the visit, additional usage of staff PPE, and extensive cleaning of exam room while observing appropriate contact time as indicated for disinfecting solutions.  Subjective:     Patient ID: [name] , male    DOB: 1946/05/20 , 74 y.o.   MRN: [id number]   Chief Complaint  Patient presents with  . Hypertension    HPI  Hypertension This is a chronic problem. The current episode started more than 1 year ago. The problem has been gradually improving since onset. The problem is controlled. Pertinent negatives include no blurred vision, chest pain, headaches or shortness of breath. Risk factors for coronary artery disease include dyslipidemia, male gender and obesity. Compliance problems include diet.  Identifiable causes of hypertension include chronic renal disease.     Past Medical History:  Diagnosis Date  . Chronic renal insufficiency    WITH BASELINE CREATININE OF 1.6  . Chronic systolic CHF (congestive heart failure) ([place])   . Complete heart block (HCC)    s/p PPM by Dr Verlon Setting  . Coronary disease    NONOBSTRUCTIVE  . GERD (gastroesophageal reflux disease)   . Gout   . Hyperkalemia   . Hypertension   . Nonischemic cardiomyopathy ([place])   . Presence of permanent cardiac pacemaker      Family History  Problem Relation Age of Onset  . Diabetes Father      Current Outpatient Medications:  .  acetaminophen (TYLENOL) 325 MG tablet, Take 650 mg by mouth every 6 (six) hours as needed for headache (pain)., Disp: , Rfl:  .  ADVAIR DISKUS 250-50 MCG/DOSE AEPB, INHALE 1 PUFF TWICE A DAY IN MORNING AND EVENING APPROXIMATELY 12 HOURS APART, Disp: 180 each, Rfl: 1 .  allopurinol (ZYLOPRIM) 100 MG tablet, TAKE 1 TABLET BY MOUTH EVERY DAY, Disp: 90 tablet, Rfl: 1 .  Ascorbic Acid (VITAMIN C) 1000 MG tablet, Take 2,000 mg by mouth daily. , Disp: ,  Rfl:  .  carvedilol (COREG) 12.5 MG tablet, Take 1 tablet (12.5 mg total) by mouth 2 (two) times daily., Disp: 180 tablet, Rfl: 3 .  Cholecalciferol (VITAMIN D-3) 1000 UNITS CAPS, Take 2,000 Units by mouth daily. , Disp: , Rfl:  .  Colchicine 0.6 MG CAPS, Take 0.6 mg by mouth daily as needed (gout). , Disp: , Rfl:  .  Fexofenadine HCl (ALLEGRA PO), Take 1 tablet by mouth daily as needed (itching)., Disp: , Rfl:  .  furosemide (LASIX) 40 MG tablet, TAKE 1 TABLET BY MOUTH EVERY DAY (Patient taking differently: Take 40 mg by mouth daily. ), Disp: 90 tablet, Rfl: 1 .  Garlic 6734 MG CAPS, Take 3,000 mg by mouth daily., Disp: , Rfl:  .  isosorbide-hydrALAZINE (BIDIL) 20-37.5 MG tablet, Take 1 tablet by mouth 3 (three) times daily., Disp: 270 tablet, Rfl: 3 .  aspirin EC 81 MG tablet, Take 81 mg by mouth daily., Disp: , Rfl:    Allergies  Allergen Reactions  . Avalide [Irbesartan-Hydrochlorothiazide] Anaphylaxis  . Penicillins Anaphylaxis    Did it involve swelling of the face/tongue/throat, SOB, or low BP? Yes Did it involve sudden or severe rash/hives, skin peeling, or any reaction on the inside of your mouth or nose? Yes Did you need to seek medical attention at a hospital or doctor's office? Reaction occurred at MD office - penicillin injection When did it last happen?young adult If all above answers  are "NO", may proceed with cephalosporin use.  Dorien Chihuahua [Tolnaftate] Hives and Rash          Review of Systems  Constitutional: Negative.   Eyes: Negative for blurred vision.  Respiratory: Negative.  Negative for shortness of breath.   Cardiovascular: Negative.  Negative for chest pain.  Gastrointestinal: Negative.   Neurological: Negative.  Negative for headaches.  Psychiatric/Behavioral: Negative.      Today's Vitals   02/20/20 0912  BP: 118/66  Pulse: 86  Temp: 98 F (36.7 C)  TempSrc: Oral  Weight: 254 lb (115.2 kg)  PainSc: 0-No pain   Body mass index is 40.26  kg/m.   Objective:  Physical Exam Vitals and nursing note reviewed.  Constitutional:      Appearance: Normal appearance.  Cardiovascular:     Rate and Rhythm: Normal rate and regular rhythm.     Heart sounds: Normal heart sounds.  Pulmonary:     Effort: Pulmonary effort is normal.     Breath sounds: Normal breath sounds.  Skin:    General: Skin is warm.  Neurological:     General: No focal deficit present.     Mental Status: He is alert.  Psychiatric:        Mood and Affect: Mood normal.         Assessment And Plan:      1. 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 He will continue with current meds. He is encouraged to avoid adding salt to his foods. I will check renal function today.   - CMP14+EGFR  2. Stage 3 chronic kidney disease, unspecified whether stage 3a or 3b CKD  Chronic, this has been stable. I will check renal function today. He is encouraged to keep up with Nephrology appointments.   3. Chronic systolic CHF (congestive heart failure) (HCC)  NICM.  Chronic, yet stable. He does not appear to have fluid overload at this time. He denies having SOB at rest/exertion at this time.  He is encouraged to follow a low-sodium diet.   4. Complete heart block [facility])  He is s/p pacemaker. [employer] PPM in place.  Normal PPM function as per Cards. He is encouraged to keep all Cardiology appointments.   5. Pure hypercholesterolemia  Chronic, I will check non-fasting lipid panel. He is encouraged to avoid fried foods, cut back on meat intake and to incorporate more exercise into his daily routine.   - Lipid panel  6. 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 I will check uric acid level today. He is encouraged to follow a low-purine diet.   - Uric acid  7. Class 3 severe obesity due to excess calories with serious comorbidity and body mass  index (BMI) of 40.0 to 44.9 in adult (HCC)  BMI 40. He is encouraged to incorporate more exercise into his daily routine. Advised to aim for 30 minutes of exercise five days per week.   8. Other abnormal glucose  HIS A1C HAS BEEN ELEVATED IN THE PAST. I WILL CHECK AN A1C, BMET TODAY. HE WAS ENCOURAGED TO AVOID SUGARY BEVERAGES AND PROCESSED FOODS INCLUDNG BREADS, RICE AND PASTA.  - Hemoglobin A1c   [name], MD    THE PATIENT IS ENCOURAGED TO PRACTICE SOCIAL DISTANCING DUE TO THE COVID-19 PANDEMIC.

## 2020-02-20 NOTE — Patient Instructions (Signed)

## 2020-02-21 LAB — CMP14+EGFR
ALT: 26 IU/L (ref 0–44)
AST: 24 IU/L (ref 0–40)
Albumin/Globulin Ratio: 1.3 (ref 1.2–2.2)
Albumin: 4.1 g/dL (ref 3.7–4.7)
Alkaline Phosphatase: 267 IU/L — ABNORMAL HIGH (ref 48–121)
BUN/Creatinine Ratio: 28 — ABNORMAL HIGH (ref 10–24)
BUN: 44 mg/dL — ABNORMAL HIGH (ref 8–27)
Bilirubin Total: 0.6 mg/dL (ref 0.0–1.2)
CO2: 22 mmol/L (ref 20–29)
Calcium: 8.9 mg/dL (ref 8.6–10.2)
Chloride: 102 mmol/L (ref 96–106)
Creatinine, Ser: 1.57 mg/dL — ABNORMAL HIGH (ref 0.76–1.27)
GFR calc Af Amer: 49 mL/min/{1.73_m2} — ABNORMAL LOW (ref >59)
GFR calc non Af Amer: 42 mL/min/{1.73_m2} — ABNORMAL LOW (ref >59)
Globulin, Total: 3.1 g/dL (ref 1.5–4.5)
Glucose: 96 mg/dL (ref 65–99)
Potassium: 4.7 mmol/L (ref 3.5–5.2)
Sodium: 138 mmol/L (ref 134–144)
Total Protein: 7.2 g/dL (ref 6.0–8.5)

## 2020-02-21 LAB — LIPID PANEL
Chol/HDL Ratio: 2.7 ratio (ref 0.0–5.0)
Cholesterol, Total: 204 mg/dL — ABNORMAL HIGH (ref 100–199)
HDL: 76 mg/dL (ref >39)
LDL Chol Calc (NIH): 117 mg/dL — ABNORMAL HIGH (ref 0–99)
Triglycerides: 60 mg/dL (ref 0–149)
VLDL Cholesterol Cal: 11 mg/dL (ref 5–40)

## 2020-02-21 LAB — URIC ACID: Uric Acid: 6.3 mg/dL (ref 3.8–8.4)

## 2020-02-21 LAB — HEMOGLOBIN A1C
Est. average glucose Bld gHb Est-mCnc: 126 mg/dL
Hgb A1c MFr Bld: 6 % — ABNORMAL HIGH (ref 4.8–5.6)

## 2020-02-23 ENCOUNTER — Other Ambulatory Visit: Payer: Self-pay

## 2020-02-23 ENCOUNTER — Encounter: Payer: Self-pay | Admitting: Neurology

## 2020-02-23 MED ORDER — LISINOPRIL 2.5 MG PO TABS: 2.5000 mg | ORAL_TABLET | Freq: Every day | ORAL | 0 refills | Status: DC

## 2020-02-28 ENCOUNTER — Telehealth: Payer: Self-pay | Admitting: Internal Medicine

## 2020-02-28 NOTE — Chronic Care Management (AMB) (Signed)
  Chronic Care Management   Outreach Note  02/28/2020 Name: [name] MRN: [id number] DOB: 1946/07/23  [name] is a 74 y.o. year old male who is a primary care patient of Dorothyann Peng, MD. I reached out to [name] by phone today in response to a referral sent by [name] [facility] health plan.     An unsuccessful telephone outreach was attempted today. The patient was referred to the case management team for assistance with care management and care coordination.   Follow Up Plan: A HIPPA compliant phone message was left for the patient providing contact information and requesting a return call. The care management team will reach out to the patient again over the next 7 days. If patient returns call to provider office, please advise to call Embedded Care Management Care Guide Gwenevere Ghazi at [phone].  Gwenevere Ghazi  Care Guide, Embedded Care Coordination [facility]  [place], [place] [address] Direct Dial: [phone] [email or web address] Website: [email or web address]

## 2020-03-07 NOTE — Chronic Care Management (AMB) (Signed)
  Chronic Care Management   Note  03/07/2020 Name: [name] MRN: [id number] DOB: May 16, 1946  [name] is a 73 y.o. year old male who is a primary care patient of Glendale Chard, MD. I reached out to [name] by phone today in response to a referral sent by [name] [facility] health plan.     [name] was given information about Chronic Care Management services today including:  1. CCM service includes personalized support from designated clinical staff supervised by his physician, including individualized plan of care and coordination with other care providers 2. 24/7 contact phone numbers for assistance for urgent and routine care needs. 3. Service will only be billed when office clinical staff spend 20 minutes or more in a month to coordinate care. 4. Only one practitioner may furnish and bill the service in a calendar month. 5. The patient may stop CCM services at any time (effective at the end of the month) by phone call to the office staff. 6. The patient will be responsible for cost sharing (co-pay) of up to 20% of the service fee (after annual deductible is met).  Patient did not agree to enrollment in care management services and does not wish to consider at this time.  Follow up plan: The care management team is available to follow up with the patient after provider conversation with the patient regarding recommendation for care management engagement and subsequent re-referral to the care management team.  The patient has been provided with contact information for the care management team and has been advised to call with any health related questions or concerns.   [name], [place] [address] Direct Dial: [phone] [email or web address] Website: [email or web address]

## 2020-05-09 ENCOUNTER — Other Ambulatory Visit: Payer: Self-pay | Admitting: Internal Medicine

## 2020-05-21 NOTE — Progress Notes (Signed)
[name] Date of Birth: Mar 21, 1946   History of Present Illness: Mr. Nigh is seen today for followup of congestive heart failure. He was last seen by me in September 2016. He has been noncompliant with medical therapy and follow up here and in the device clinic. He has a history of complete heart block and presented in September of 2010 with a heart rate of 10. He had a permanent pacemaker placed at that time. He did have acute renal failure and hyperkalemia at the time. He was on either an ACE inhibitor or ARB at that time. In November 2014 he presented with worsening CHF.  Echocardiogram showed marked LV dysfunction with ejection fraction of 20-25%. He had moderate pulmonary HTN. He was started on nitrates and hydralazine. He underwent right and left heart cath on 08/16/13 with moderate pulmonary HTN and elevated filling pressures. 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   He declined ICD. In June 2016 he was found to have lead fractures of atrial and ventricular leads and underwent revision of pacemaker leads and upgrade to CRT device. Repeat Echo in September 2019 showed persistent LV dysfunction with EF 25-30%. Last device check 01/03/20 was normal.  On follow up today he states he feels great. No chest pain or dyspnea. No palpitations. He still has swelling in his ankles. States the [place] started him on hydralazine. Doesn't know what dose. Did not start Bidil. Reports they also reduced lasix to 20 mg daily.   Current Outpatient Medications on File Prior to Visit  Medication Sig Dispense Refill  . acetaminophen (TYLENOL) 325 MG tablet Take 650 mg by mouth every 6 (six) hours as needed for headache (pain).    . ADVAIR DISKUS 250-50 MCG/DOSE AEPB INHALE 1 PUFF TWICE A DAY IN MORNING AND EVENING APPROXIMATELY 12 HOURS APART 180 each 1  . allopurinol (ZYLOPRIM) 100 MG tablet TAKE 1 TABLET BY MOUTH EVERY DAY 90 tablet 1  . Ascorbic Acid (VITAMIN C) 1000 MG tablet Take 2,000 mg by  mouth daily.     Marland Kitchen aspirin EC 81 MG tablet Take 81 mg by mouth daily.    . Cholecalciferol (VITAMIN D-3) 1000 UNITS CAPS Take 2,000 Units by mouth daily.     . Colchicine 0.6 MG CAPS Take 0.6 mg by mouth daily as needed (gout).     . Fexofenadine HCl (ALLEGRA PO) Take 1 tablet by mouth daily as needed (itching).    . furosemide (LASIX) 40 MG tablet TAKE 1 TABLET BY MOUTH EVERY DAY (Patient taking differently: Take 40 mg by mouth daily. ) 90 tablet 1  . Garlic 1000 MG CAPS Take 3,000 mg by mouth daily.    Marland Kitchen lisinopril (ZESTRIL) 2.5 MG tablet Take 1 tablet (2.5 mg total) by mouth daily. 30 tablet 0  . carvedilol (COREG) 12.5 MG tablet Take 1 tablet (12.5 mg total) by mouth 2 (two) times daily. 180 tablet 3   No current facility-administered medications on file prior to visit.    Allergies  Allergen Reactions  . Avalide [Irbesartan-Hydrochlorothiazide] Anaphylaxis  . Penicillins Anaphylaxis    Did it involve swelling of the face/tongue/throat, SOB, or low BP? Yes Did it involve sudden or severe rash/hives, skin peeling, or any reaction on the inside of your mouth or nose? Yes Did you need to seek medical attention at a hospital or doctor's office? Reaction occurred at MD office - penicillin injection When did it last happen?young adult If all above answers are "  NO", may proceed with cephalosporin use.  Dorris Singh [Tolnaftate] Hives and Rash         Past Medical History:  Diagnosis Date  . Chronic renal insufficiency    WITH BASELINE CREATININE OF 1.6  . Chronic systolic CHF (congestive heart failure) (HCC)   . Complete heart block (HCC)    s/p PPM by Dr Reyes Ivan  . Coronary disease    NONOBSTRUCTIVE  . GERD (gastroesophageal reflux disease)   . Gout   . Hyperkalemia   . Hypertension   . Nonischemic cardiomyopathy (HCC)   . Presence of permanent cardiac pacemaker     Past Surgical History:  Procedure Laterality Date  . CARDIAC CATHETERIZATION  08/2013  . EP IMPLANTABLE  DEVICE N/A 02/15/2015   Procedure: Lead Revision/Repair;  Surgeon: Hillis Range, MD;  Location: [facility] INVASIVE CV LAB;  Service: Cardiovascular;  Laterality: N/A;  . INGUINAL HERNIA REPAIR Bilateral   . INSERT / REPLACE / REMOVE PACEMAKER  05/20/2009   MDT by Dr Reyes Ivan for complete heart block  . LEFT AND RIGHT HEART CATHETERIZATION WITH CORONARY ANGIOGRAM N/A 08/16/2013   Procedure: LEFT AND RIGHT HEART CATHETERIZATION WITH CORONARY ANGIOGRAM;  Surgeon: Clay Solum M Swaziland, MD;  Location: [facility] CATH LAB;  Service: Cardiovascular;  Laterality: N/A;    Social History   Tobacco Use  Smoking Status Former Smoker  . Packs/day: 1.00  . Years: 15.00  . Pack years: 15.00  . Types: Cigarettes  Smokeless Tobacco Never Used  Tobacco Comment   "quit smoking cigarettes in 1985"    Social History   Substance and Sexual Activity  Alcohol Use Yes  . Alcohol/week: 7.0 standard drinks  . Types: 7 Shots of liquor per week    Family History  Problem Relation Age of Onset  . Diabetes Father     Review of Systems:  As note in HPI. All other systems were reviewed and are negative.  Physical Exam: BP 130/74   Pulse 76   Temp 98.4 F (36.9 C)   Ht 5\' 9"  (1.753 m)   Wt 257 lb 3.2 oz (116.7 kg)   SpO2 96%   BMI 37.98 kg/m   GENERAL:  Well appearing overweight BM in NAD HEENT:  PERRL, EOMI, sclera are clear. Oropharynx is clear. NECK:  No jugular venous distention, carotid upstroke brisk and symmetric, no bruits, no thyromegaly or adenopathy LUNGS:  Clear to auscultation bilaterally CHEST:  Unremarkable HEART:  RRR,  PMI not displaced or sustained,S1 and S2 within normal limits, no S3, no S4: no clicks, no rubs, no murmurs ABD:  Soft, nontender. BS +, no masses or bruits. No hepatomegaly, no splenomegaly EXT:  2 + pulses throughout, 1-2+ pretibial edema, no cyanosis no clubbing SKIN:  Warm and dry.  No rashes NEURO:  Alert and oriented x 3. Cranial nerves II through XII intact. PSYCH:  Cognitively  intact    LABORATORY DATA: Lab Results  Component Value Date   WBC 6.8 09/11/2019   HGB 13.0 09/11/2019   HCT 41.1 09/11/2019   PLT 207 09/11/2019   GLUCOSE 96 02/20/2020   CHOL 204 (H) 02/20/2020   TRIG 60 02/20/2020   HDL 76 02/20/2020   LDLCALC 117 (H) 02/20/2020   ALT 26 02/20/2020   AST 24 02/20/2020   NA 138 02/20/2020   K 4.7 02/20/2020   CL 102 02/20/2020   CREATININE 1.57 (H) 02/20/2020   BUN 44 (H) 02/20/2020   CO2 22 02/20/2020   TSH 1.67 07/28/2013  INR 1.18 02/14/2015   HGBA1C 6.0 (H) 02/20/2020   MICROALBUR 30 08/17/2019    Dated 07/09/17: cholesterol 203, triglycerides 62, HDL 72, LDL 119.  Dated 01/06/18: creatinine 1.48. A1c 5.9%. Chemistries Normal    Echo 06/02/18: Study Conclusions  - Left ventricle: The cavity size was moderately dilated. There was   moderate concentric hypertrophy. Systolic function was severely   reduced. The estimated ejection fraction was in the range of 25%   to 30%. Severe diffuse hypokinesis. There was an increased   relative contribution of atrial contraction to ventricular   filling. Doppler parameters are consistent with abnormal left   ventricular relaxation (grade 1 diastolic dysfunction). - Aortic valve: Trileaflet; moderately thickened, moderately   calcified leaflets. - Mitral valve: There was mild regurgitation. - Left atrium: The atrium was mildly dilated. - Right ventricle: Pacer wire or catheter noted in right ventricle.   Systolic function was mildly to moderately reduced. - Right atrium: Pacer wire or catheter noted in right atrium. - Pulmonary arteries: Systolic pressure could not be accurately   estimated.  Assessment / Plan: 1. Chronic systolic congestive heart failure.  Ejection fraction of 25-30%. No improvement in Echo despite  CRT therapy.  Nonischemic.  He is intolerant to ACE inhibitors/ARBs related to hyperkalemia and acute renal failure. He has lisinopril listed on his med list but he doesn't  think he is taking it and he should not based on prior history.  He is on Coreg today 12.5 mg bid, hydralazine (unknown dose) and lasix. Recommend he increase lasix back to 40 mg daily since he has edema and weight is up.   2. CKD stage 3. Followed by Dr. Hyman Hopes- Nephrology. Creatinine stable 1.5.   3 .HTN- control is improved on hydralazine. ? Dose  4. Complete heart block status post permanent pacemaker. Now with CRT device. Last check 01/03/20 OK.   I will plan on follow up in 6 months

## 2020-05-23 ENCOUNTER — Encounter: Payer: Self-pay | Admitting: Cardiology

## 2020-05-23 ENCOUNTER — Ambulatory Visit (INDEPENDENT_AMBULATORY_CARE_PROVIDER_SITE_OTHER): Payer: Medicare HMO | Admitting: Cardiology

## 2020-05-23 ENCOUNTER — Other Ambulatory Visit: Payer: Self-pay

## 2020-05-23 VITALS — BP 130/74 | HR 76 | Temp 98.4°F | Ht 69.0 in | Wt 257.2 lb

## 2020-05-23 DIAGNOSIS — Z950 Presence of cardiac pacemaker: Secondary | ICD-10-CM

## 2020-05-23 DIAGNOSIS — I10 Essential (primary) hypertension: Secondary | ICD-10-CM | POA: Diagnosis not present

## 2020-05-23 DIAGNOSIS — I442 Atrioventricular block, complete: Secondary | ICD-10-CM | POA: Diagnosis not present

## 2020-05-23 DIAGNOSIS — I5022 Chronic systolic (congestive) heart failure: Secondary | ICD-10-CM | POA: Diagnosis not present

## 2020-05-23 MED ORDER — HYDRALAZINE HCL 25 MG PO TABS: 25.0000 mg | ORAL_TABLET | Freq: Three times a day (TID) | ORAL | 3 refills | Status: AC

## 2020-05-23 NOTE — Patient Instructions (Signed)
Increase lasix to 40 mg daily  Continue your other therapy

## 2020-05-25 DIAGNOSIS — E261 Secondary hyperaldosteronism: Secondary | ICD-10-CM | POA: Diagnosis not present

## 2020-05-25 DIAGNOSIS — I4430 Unspecified atrioventricular block: Secondary | ICD-10-CM | POA: Diagnosis not present

## 2020-05-25 DIAGNOSIS — G8929 Other chronic pain: Secondary | ICD-10-CM | POA: Diagnosis not present

## 2020-05-25 DIAGNOSIS — I429 Cardiomyopathy, unspecified: Secondary | ICD-10-CM | POA: Diagnosis not present

## 2020-05-25 DIAGNOSIS — K219 Gastro-esophageal reflux disease without esophagitis: Secondary | ICD-10-CM | POA: Diagnosis not present

## 2020-05-25 DIAGNOSIS — Z008 Encounter for other general examination: Secondary | ICD-10-CM | POA: Diagnosis not present

## 2020-05-25 DIAGNOSIS — I509 Heart failure, unspecified: Secondary | ICD-10-CM | POA: Diagnosis not present

## 2020-05-25 DIAGNOSIS — M109 Gout, unspecified: Secondary | ICD-10-CM | POA: Diagnosis not present

## 2020-05-25 DIAGNOSIS — I110 Hypertensive heart disease with heart failure: Secondary | ICD-10-CM | POA: Diagnosis not present

## 2020-05-25 DIAGNOSIS — J45909 Unspecified asthma, uncomplicated: Secondary | ICD-10-CM | POA: Diagnosis not present

## 2020-05-29 ENCOUNTER — Telehealth: Payer: Self-pay | Admitting: Cardiology

## 2020-05-29 NOTE — Telephone Encounter (Signed)
05/23/20 visit with Dr. Swaziland   Assessment / Plan: 1. Chronic systolic congestive heart failure.  Ejection fraction of 25-30%. No improvement in Echo despite  CRT therapy.  Nonischemic.  He is intolerant to ACE inhibitors/ARBs related to hyperkalemia and acute renal failure. He has lisinopril listed on his med list but he doesn't think he is taking it and he should not based on prior history.  He is on Coreg today 12.5 mg bid, hydralazine (unknown dose) and lasix. Recommend he increase lasix back to 40 mg daily since he has edema and weight is up.   3 .HTN- control is improved on hydralazine. ? Dose  Attempted to discuss medication(s) with patient but he only wants to talk with Elnita Maxwell and states he told this to operator. Message routed

## 2020-05-29 NOTE — Telephone Encounter (Signed)
Patient states at his last appointment Dr. Swaziland mentioned whether he was taking a medication. He states he does not remember the name of the medication. He states he was supposed to get a call back, but has not heard anything.

## 2020-05-29 NOTE — Telephone Encounter (Signed)
Spoke to patient stated Dr.[name] wanted to know if he was taking Lisinopril.Stated he was taking until his appointment last week 9/15.Dr.[name] advised do not take Lisinopril.

## 2020-06-04 ENCOUNTER — Ambulatory Visit: Payer: No Typology Code available for payment source | Admitting: Neurology

## 2020-06-27 ENCOUNTER — Telehealth: Payer: Self-pay

## 2020-06-27 NOTE — Telephone Encounter (Signed)
I called the patient to reschedule AWV with [place]. Pt didn't want to come in for 2 separate visits, and there were no other time slots for same day AWV/CPE.

## 2020-06-28 ENCOUNTER — Ambulatory Visit (INDEPENDENT_AMBULATORY_CARE_PROVIDER_SITE_OTHER): Payer: Medicare HMO

## 2020-06-28 VITALS — Ht 69.0 in | Wt 250.0 lb

## 2020-06-28 DIAGNOSIS — Z0000 Encounter for general adult medical examination without abnormal findings: Secondary | ICD-10-CM | POA: Diagnosis not present

## 2020-06-28 NOTE — Patient Instructions (Signed)
[name] , Thank you for taking time to come for your Medicare Wellness Visit. I appreciate your ongoing commitment to your health goals. Please review the following plan we discussed and let me know if I can assist you in the future.   Screening recommendations/referrals: Colonoscopy: not required Recommended yearly ophthalmology/optometry visit for glaucoma screening and checkup Recommended yearly dental visit for hygiene and checkup  Vaccinations: Influenza vaccine: decline Pneumococcal vaccine: decline Tdap vaccine: decline Shingles vaccine: decline   Covid-19:  12/13/2019, 11/14/2019  Advanced directives: Advance directive discussed with you today. Even though you declined this today please call our office should you change your mind and we can give you the proper paperwork for you to fill out.   Conditions/risks identified: none  Next appointment: 08/28/2020 at 2:30 Follow up in one year for your annual wellness visit.   Preventive Care 11 Years and Older, Male Preventive care refers to lifestyle choices and visits with your health care provider that can promote health and wellness. What does preventive care include?  A yearly physical exam. This is also called an annual well check.  Dental exams once or twice a year.  Routine eye exams. Ask your health care provider how often you should have your eyes checked.  Personal lifestyle choices, including:  Daily care of your teeth and gums.  Regular physical activity.  Eating a healthy diet.  Avoiding tobacco and drug use.  Limiting alcohol use.  Practicing safe sex.  Taking low doses of aspirin every day.  Taking vitamin and mineral supplements as recommended by your health care provider. What happens during an annual well check? The services and screenings done by your health care provider during your annual well check will depend on your age, overall health, lifestyle risk factors, and family history of  disease. Counseling  Your health care provider may ask you questions about your:  Alcohol use.  Tobacco use.  Drug use.  Emotional well-being.  Home and relationship well-being.  Sexual activity.  Eating habits.  History of falls.  Memory and ability to understand (cognition).  Work and work Astronomer. Screening  You may have the following tests or measurements:  Height, weight, and BMI.  Blood pressure.  Lipid and cholesterol levels. These may be checked every 5 years, or more frequently if you are over 33 years old.  Skin check.  Lung cancer screening. You may have this screening every year starting at age 18 if you have a 30-pack-year history of smoking and currently smoke or have quit within the past 15 years.  Fecal occult blood test (FOBT) of the stool. You may have this test every year starting at age 46.  Flexible sigmoidoscopy or colonoscopy. You may have a sigmoidoscopy every 5 years or a colonoscopy every 10 years starting at age 27.  Prostate cancer screening. Recommendations will vary depending on your family history and other risks.  Hepatitis C blood test.  Hepatitis B blood test.  Sexually transmitted disease (STD) testing.  Diabetes screening. This is done by checking your blood sugar (glucose) after you have not eaten for a while (fasting). You may have this done every 1-3 years.  Abdominal aortic aneurysm (AAA) screening. You may need this if you are a current or former smoker.  Osteoporosis. You may be screened starting at age 39 if you are at high risk. Talk with your health care provider about your test results, treatment options, and if necessary, the need for more tests. Vaccines  Your health  care provider may recommend certain vaccines, such as:  Influenza vaccine. This is recommended every year.  Tetanus, diphtheria, and acellular pertussis (Tdap, Td) vaccine. You may need a Td booster every 10 years.  Zoster vaccine. You may  need this after age 8.  Pneumococcal 13-valent conjugate (PCV13) vaccine. One dose is recommended after age 66.  Pneumococcal polysaccharide (PPSV23) vaccine. One dose is recommended after age 40. Talk to your health care provider about which screenings and vaccines you need and how often you need them. This information is not intended to replace advice given to you by your health care provider. Make sure you discuss any questions you have with your health care provider. Document Released: 09/21/2015 Document Revised: 05/14/2016 Document Reviewed: 06/26/2015 Elsevier Interactive Patient Education  2017 [employer] Prevention in the Home Falls can cause injuries. They can happen to people of all ages. There are many things you can do to make your home safe and to help prevent falls. What can I do on the outside of my home?  Regularly fix the edges of walkways and driveways and fix any cracks.  Remove anything that might make you trip as you walk through a door, such as a raised step or threshold.  Trim any bushes or trees on the path to your home.  Use bright outdoor lighting.  Clear any walking paths of anything that might make someone trip, such as rocks or tools.  Regularly check to see if handrails are loose or broken. Make sure that both sides of any steps have handrails.  Any raised decks and porches should have guardrails on the edges.  Have any leaves, snow, or ice cleared regularly.  Use sand or salt on walking paths during winter.  Clean up any spills in your garage right away. This includes oil or grease spills. What can I do in the bathroom?  Use night lights.  Install grab bars by the toilet and in the tub and shower. Do not use towel bars as grab bars.  Use non-skid mats or decals in the tub or shower.  If you need to sit down in the shower, use a plastic, non-slip stool.  Keep the floor dry. Clean up any water that spills on the floor as soon as it  happens.  Remove soap buildup in the tub or shower regularly.  Attach bath mats securely with double-sided non-slip rug tape.  Do not have throw rugs and other things on the floor that can make you trip. What can I do in the bedroom?  Use night lights.  Make sure that you have a light by your bed that is easy to reach.  Do not use any sheets or blankets that are too big for your bed. They should not hang down onto the floor.  Have a firm chair that has side arms. You can use this for support while you get dressed.  Do not have throw rugs and other things on the floor that can make you trip. What can I do in the kitchen?  Clean up any spills right away.  Avoid walking on wet floors.  Keep items that you use a lot in easy-to-reach places.  If you need to reach something above you, use a strong step stool that has a grab bar.  Keep electrical cords out of the way.  Do not use floor polish or wax that makes floors slippery. If you must use wax, use non-skid floor wax.  Do not have  throw rugs and other things on the floor that can make you trip. What can I do with my stairs?  Do not leave any items on the stairs.  Make sure that there are handrails on both sides of the stairs and use them. Fix handrails that are broken or loose. Make sure that handrails are as long as the stairways.  Check any carpeting to make sure that it is firmly attached to the stairs. Fix any carpet that is loose or worn.  Avoid having throw rugs at the top or bottom of the stairs. If you do have throw rugs, attach them to the floor with carpet tape.  Make sure that you have a light switch at the top of the stairs and the bottom of the stairs. If you do not have them, ask someone to add them for you. What else can I do to help prevent falls?  Wear shoes that:  Do not have high heels.  Have rubber bottoms.  Are comfortable and fit you well.  Are closed at the toe. Do not wear sandals.  If you  use a stepladder:  Make sure that it is fully opened. Do not climb a closed stepladder.  Make sure that both sides of the stepladder are locked into place.  Ask someone to hold it for you, if possible.  Clearly mark and make sure that you can see:  Any grab bars or handrails.  First and last steps.  Where the edge of each step is.  Use tools that help you move around (mobility aids) if they are needed. These include:  Canes.  Walkers.  Scooters.  Crutches.  Turn on the lights when you go into a dark area. Replace any light bulbs as soon as they burn out.  Set up your furniture so you have a clear path. Avoid moving your furniture around.  If any of your floors are uneven, fix them.  If there are any pets around you, be aware of where they are.  Review your medicines with your doctor. Some medicines can make you feel dizzy. This can increase your chance of falling. Ask your doctor what other things that you can do to help prevent falls. This information is not intended to replace advice given to you by your health care provider. Make sure you discuss any questions you have with your health care provider. Document Released: 06/21/2009 Document Revised: 01/31/2016 Document Reviewed: 09/29/2014 Elsevier Interactive Patient Education  2017 [employer].

## 2020-06-28 NOTE — Progress Notes (Signed)
I connected with [name] today by telephone and verified that I am speaking with the correct person using two identifiers. Location patient: home Location provider: work Persons participating in the virtual visit: [name], Gregory Ponder LPN.   I discussed the limitations, risks, security and privacy concerns of performing an evaluation and management service by telephone and the availability of in person appointments. I also discussed with the patient that there may be a patient responsible charge related to this service. The patient expressed understanding and verbally consented to this telephonic visit.    Interactive audio and video telecommunications were attempted between this provider and patient, however failed, due to patient having technical difficulties OR patient did not have access to video capability.  We continued and completed visit with audio only.     Vital signs may be patient reported or missing.  Subjective:   [name] is a 73 y.o. male who presents for Medicare Annual/Subsequent preventive examination.  Review of Systems     Cardiac Risk Factors include: advanced age (>37men, >96 women);male gender;obesity (BMI >30kg/m2);sedentary lifestyle     Objective:    Today's Vitals   06/28/20 0949  Weight: 250 lb (113.4 kg)  Height: 5\' 9"  (1.753 m)   Body mass index is 36.92 kg/m.  Advanced Directives 06/28/2020 09/21/2019 08/17/2019 07/10/2017 02/14/2015 08/16/2013  Does Patient Have a Medical Advance Directive? No Yes No No No Patient does not have advance directive  Type of Advance Directive - Living will;Healthcare Power of Attorney - - - -  Copy of Healthcare Power of Attorney in Chart? - No - copy requested - - - -  Would patient like information on creating a medical advance directive? - - No - Patient declined No - Patient declined No - patient declined information -  Pre-existing out of facility DNR order (yellow form or pink MOST form) - - -  - - No    Current Medications (verified) Outpatient Encounter Medications as of 06/28/2020  Medication Sig  . acetaminophen (TYLENOL) 325 MG tablet Take 650 mg by mouth every 6 (six) hours as needed for headache (pain).  . ADVAIR DISKUS 250-50 MCG/DOSE AEPB INHALE 1 PUFF TWICE A DAY IN MORNING AND EVENING APPROXIMATELY 12 HOURS APART  . allopurinol (ZYLOPRIM) 100 MG tablet TAKE 1 TABLET BY MOUTH EVERY DAY  . Ascorbic Acid (VITAMIN C) 1000 MG tablet Take 2,000 mg by mouth daily.   Marland Kitchen aspirin EC 81 MG tablet Take 81 mg by mouth daily.  . Cholecalciferol (VITAMIN D-3) 1000 UNITS CAPS Take 2,000 Units by mouth daily.   . Colchicine 0.6 MG CAPS Take 0.6 mg by mouth daily as needed (gout).   . Fexofenadine HCl (ALLEGRA PO) Take 1 tablet by mouth daily as needed (itching).  . furosemide (LASIX) 40 MG tablet TAKE 1 TABLET BY MOUTH EVERY DAY (Patient taking differently: Take 40 mg by mouth daily. )  . Garlic 1000 MG CAPS Take 3,000 mg by mouth daily.  . hydrALAZINE (APRESOLINE) 25 MG tablet Take 1 tablet (25 mg total) by mouth 3 (three) times daily.  . carvedilol (COREG) 12.5 MG tablet Take 1 tablet (12.5 mg total) by mouth 2 (two) times daily.   No facility-administered encounter medications on file as of 06/28/2020.    Allergies (verified) Avalide [irbesartan-hydrochlorothiazide], Penicillins, and Tinactin [tolnaftate]   History: Past Medical History:  Diagnosis Date  . Chronic renal insufficiency    WITH BASELINE CREATININE OF 1.6  . Chronic systolic CHF (congestive heart  failure) (HCC)   . Complete heart block (HCC)    s/p PPM by Dr Reyes Ivan  . Coronary disease    NONOBSTRUCTIVE  . GERD (gastroesophageal reflux disease)   . Gout   . Hyperkalemia   . Hypertension   . Nonischemic cardiomyopathy (HCC)   . Presence of permanent cardiac pacemaker    Past Surgical History:  Procedure Laterality Date  . CARDIAC CATHETERIZATION  08/2013  . EP IMPLANTABLE DEVICE N/A 02/15/2015   Procedure:  Lead Revision/Repair;  Surgeon: Hillis Range, MD;  Location: [facility] INVASIVE CV LAB;  Service: Cardiovascular;  Laterality: N/A;  . INGUINAL HERNIA REPAIR Bilateral   . INSERT / REPLACE / REMOVE PACEMAKER  05/20/2009   MDT by Dr Reyes Ivan for complete heart block  . LEFT AND RIGHT HEART CATHETERIZATION WITH CORONARY ANGIOGRAM N/A 08/16/2013   Procedure: LEFT AND RIGHT HEART CATHETERIZATION WITH CORONARY ANGIOGRAM;  Surgeon: [name] Swaziland, MD;  Location: [facility] CATH LAB;  Service: Cardiovascular;  Laterality: N/A;   Family History  Problem Relation Age of Onset  . Diabetes Father    Social History   Socioeconomic History  . Marital status: Divorced    Spouse name: Not on file  . Number of children: 1  . Years of education: 57  . Highest education level: Not on file  Occupational History  . Occupation: Sports administrator: Biomedical scientist  . Occupation: retired  Tobacco Use  . Smoking status: Former Smoker    Packs/day: 1.00    Years: 15.00    Pack years: 15.00    Types: Cigarettes  . Smokeless tobacco: Never Used  . Tobacco comment: "quit smoking cigarettes in 1985"  Vaping Use  . Vaping Use: Never used  Substance and Sexual Activity  . Alcohol use: Yes    Alcohol/week: 7.0 standard drinks    Types: 7 Shots of liquor per week  . Drug use: No  . Sexual activity: Not Currently  Other Topics Concern  . Not on file  Social History Narrative   Lives in [place].   Drinks a couple of coffees a day   Lives at home alone with his dog   Social Determinants of Health   Financial Resource Strain: Low Risk   . Difficulty of Paying Living Expenses: Not hard at all  Food Insecurity: No Food Insecurity  . Worried About Programme researcher, broadcasting/film/video in the Last Year: Never true  . Ran Out of Food in the Last Year: Never true  Transportation Needs: No Transportation Needs  . Lack of Transportation (Medical): No  . Lack of Transportation (Non-Medical): No  Physical Activity: Inactive  . Days of  Exercise per Week: 0 days  . Minutes of Exercise per Session: 0 min  Stress: No Stress Concern Present  . Feeling of Stress : Not at all  Social Connections:   . Frequency of Communication with Friends and Family: Not on file  . Frequency of Social Gatherings with Friends and Family: Not on file  . Attends Religious Services: Not on file  . Active Member of Clubs or Organizations: Not on file  . Attends Banker Meetings: Not on file  . Marital Status: Not on file    Tobacco Counseling Counseling given: Not Answered Comment: "quit smoking cigarettes in 1985"   Clinical Intake:  Pre-visit preparation completed: Yes  Pain : No/denies pain     Nutritional Status: BMI > 30  Obese Nutritional Risks: None  How often do you need  to have someone help you when you read instructions, pamphlets, or other written materials from your doctor or pharmacy?: 1 - Never What is the last grade level you completed in school?: bachelor's degree  Diabetic? no  Interpreter Needed?: No  Information entered by :: NAllen LPN   Activities of Daily Living In your present state of health, do you have any difficulty performing the following activities: 06/28/2020 08/17/2019  Hearing? Y Y  Comment - bilateral hearing loss, eligible for hearing aides  Vision? N N  Difficulty concentrating or making decisions? N N  Walking or climbing stairs? N N  Dressing or bathing? N N  Doing errands, shopping? N N  Preparing Food and eating ? N N  Using the Toilet? N N  In the past six months, have you accidently leaked urine? N N  Do you have problems with loss of bowel control? N N  Managing your Medications? N N  Managing your Finances? N N  Housekeeping or managing your Housekeeping? N N  Some recent data might be hidden    Patient Care Team: Dorothyann Peng, MD as PCP - General (Internal Medicine)  Indicate any recent Medical Services you may have received from other than [facility] providers  in the past year (date may be approximate).     Assessment:   This is a routine wellness examination for [name].  Hearing/Vision screen  Hearing Screening   125Hz  250Hz  500Hz  1000Hz  2000Hz  3000Hz  4000Hz  6000Hz  8000Hz   Right ear:           Left ear:           Vision Screening Comments: No regular eye exams, [facility]  Dietary issues and exercise activities discussed: Current Exercise Habits: The patient does not participate in regular exercise at present  Goals    . DIET - REDUCE FAST FOOD INTAKE    . Patient Stated     08/17/2019, no goals    . Patient Stated     06/28/2020, no goals      Depression Screen PHQ 2/9 Scores 06/28/2020 08/17/2019 02/23/2019 08/10/2018  PHQ - 2 Score 0 0 3 0  PHQ- 9 Score - 0 6 -    Fall Risk Fall Risk  06/28/2020 08/17/2019 02/23/2019 08/10/2018  Falls in the past year? 0 0 0 0  Risk for fall due to : Medication side effect Medication side effect - -  Follow up Falls evaluation completed;Education provided;Falls prevention discussed Falls evaluation completed;Education provided;Falls prevention discussed - -    Any stairs in or around the home? Yes  If so, are there any without handrails? Yes  Home free of loose throw rugs in walkways, pet beds, electrical cords, etc? Yes  Adequate lighting in your home to reduce risk of falls? Yes   ASSISTIVE DEVICES UTILIZED TO PREVENT FALLS:  Life alert? No  Use of a cane, walker or w/c? No  Grab bars in the bathroom? No  Shower chair or bench in shower? No  Elevated toilet seat or a handicapped toilet? No   TIMED UP AND GO:  Was the test performed? No . .   Cognitive Function:     6CIT Screen 06/28/2020 08/17/2019  What Year? 0 points 0 points  What month? 0 points 0 points  What time? 0 points 0 points  Count back from 20 0 points 0 points  Months in reverse 4 points 4 points  Repeat phrase 0 points 0 points  Total Score 4  4    Immunizations Immunization History   Administered Date(s) Administered  . Moderna SARS-COVID-2 Vaccination 11/14/2019, 12/13/2019    TDAP status: Due, Education has been provided regarding the importance of this vaccine. Advised may receive this vaccine at local pharmacy or Health Dept. Aware to provide a copy of the vaccination record if obtained from local pharmacy or Health Dept. Verbalized acceptance and understanding. Flu Vaccine status: Declined, Education has been provided regarding the importance of this vaccine but patient still declined. Advised may receive this vaccine at local pharmacy or Health Dept. Aware to provide a copy of the vaccination record if obtained from local pharmacy or Health Dept. Verbalized acceptance and understanding. Pneumococcal vaccine status: Declined,  Education has been provided regarding the importance of this vaccine but patient still declined. Advised may receive this vaccine at local pharmacy or Health Dept. Aware to provide a copy of the vaccination record if obtained from local pharmacy or Health Dept. Verbalized acceptance and understanding.  Covid-19 vaccine status: Completed vaccines  Qualifies for Shingles Vaccine? Yes   Zostavax completed No   Shingrix Completed?: No.    Education has been provided regarding the importance of this vaccine. Patient has been advised to call insurance company to determine out of pocket expense if they have not yet received this vaccine. Advised may also receive vaccine at local pharmacy or Health Dept. Verbalized acceptance and understanding.  Screening Tests Health Maintenance  Topic Date Due  . TETANUS/TDAP  08/16/2020 (Originally 01/25/1965)  . PNA vac Low Risk Adult (1 of 2 - PCV13) 08/16/2020 (Originally 01/24/2009)  . INFLUENZA VACCINE  12/06/2020 (Originally 04/08/2020)  . COVID-19 Vaccine  Completed  . Hepatitis C Screening  Completed    Health Maintenance  There are no preventive care reminders to display for this patient.  Colorectal  cancer screening: No longer required.   Lung Cancer Screening: (Low Dose CT Chest recommended if Age 39-80 years, 30 pack-year currently smoking OR have quit w/in 15years.) does not qualify.   Lung Cancer Screening Referral: no  Additional Screening:  Hepatitis C Screening: does qualify; Completed 08/10/2018  Vision Screening: Recommended annual ophthalmology exams for early detection of glaucoma and other disorders of the eye. Is the patient up to date with their annual eye exam?  No  Who is the provider or what is the name of the office in which the patient attends annual eye exams? [facility] If pt is not established with a provider, would they like to be referred to a provider to establish care? No .   Dental Screening: Recommended annual dental exams for proper oral hygiene  Community Resource Referral / Chronic Care Management: CRR required this visit?  No   CCM required this visit?  No      Plan:     I have personally reviewed and noted the following in the patient's chart:   . Medical and social history . Use of alcohol, tobacco or illicit drugs  . Current medications and supplements . Functional ability and status . Nutritional status . Physical activity . Advanced directives . List of other physicians . Hospitalizations, surgeries, and ER visits in previous 12 months . Vitals . Screenings to include cognitive, depression, and falls . Referrals and appointments  In addition, I have reviewed and discussed with patient certain preventive protocols, quality metrics, and best practice recommendations. A written personalized care plan for preventive services as well as general preventive health recommendations were provided to patient.  Barb Merino, LPN   16/06/9603   Nurse Notes:

## 2020-08-28 ENCOUNTER — Ambulatory Visit (INDEPENDENT_AMBULATORY_CARE_PROVIDER_SITE_OTHER): Payer: Medicare HMO | Admitting: Internal Medicine

## 2020-08-28 ENCOUNTER — Encounter: Payer: Self-pay | Admitting: Internal Medicine

## 2020-08-28 ENCOUNTER — Other Ambulatory Visit: Payer: Self-pay

## 2020-08-28 VITALS — BP 114/72 | HR 82 | Temp 98.1°F | Ht 67.4 in | Wt 263.4 lb

## 2020-08-28 DIAGNOSIS — R7309 Other abnormal glucose: Secondary | ICD-10-CM | POA: Diagnosis not present

## 2020-08-28 DIAGNOSIS — N183 Chronic kidney disease, stage 3 unspecified: Secondary | ICD-10-CM

## 2020-08-28 DIAGNOSIS — M1A3490 Chronic gout due to renal impairment, unspecified hand, without tophus (tophi): Secondary | ICD-10-CM | POA: Diagnosis not present

## 2020-08-28 DIAGNOSIS — E7800 Pure hypercholesterolemia, unspecified: Secondary | ICD-10-CM | POA: Diagnosis not present

## 2020-08-28 DIAGNOSIS — I130 Hypertensive heart and chronic kidney disease with heart failure and stage 1 through stage 4 chronic kidney disease, or unspecified chronic kidney disease: Secondary | ICD-10-CM

## 2020-08-28 DIAGNOSIS — I442 Atrioventricular block, complete: Secondary | ICD-10-CM

## 2020-08-28 DIAGNOSIS — Z6841 Body Mass Index (BMI) 40.0 and over, adult: Secondary | ICD-10-CM | POA: Diagnosis not present

## 2020-08-28 LAB — POCT URINALYSIS DIPSTICK
Bilirubin, UA: NEGATIVE
Blood, UA: NEGATIVE
Glucose, UA: NEGATIVE
Ketones, UA: NEGATIVE
Leukocytes, UA: NEGATIVE
Nitrite, UA: NEGATIVE
Protein, UA: NEGATIVE
Spec Grav, UA: 1.02 (ref 1.010–1.025)
Urobilinogen, UA: 0.2 E.U./dL
pH, UA: 5.5 (ref 5.0–8.0)

## 2020-08-28 LAB — POCT UA - MICROALBUMIN
Albumin/Creatinine Ratio, Urine, POC: <30
Creatinine, POC: 100 mg/dL
Microalbumin Ur, POC: 10 mg/L

## 2020-08-28 NOTE — Progress Notes (Signed)
I,Katawbba Wiggins,acting as a Education administrator for [name], MD.,have documented all relevant documentation on the behalf of [name], MD,as directed by  [name], MD while in the presence of [name], MD.  This visit occurred during the SARS-CoV-2 public health emergency.  Safety protocols were in place, including screening questions prior to the visit, additional usage of staff PPE, and extensive cleaning of exam room while observing appropriate contact time as indicated for disinfecting solutions.  Subjective:     Patient ID: [name] , male    DOB: November 25, 1945 , 74 y.o.   MRN: [id number]   Chief Complaint  Patient presents with  . Annual Exam    HPI  He is here today for a full physical exam. However, he does not want to have a physical today. He states he does not "need" physical. He denies h/o chest pain, palpitations and shortness of breath.   Hypertension This is a chronic problem. The current episode started more than 1 year ago. The problem has been gradually improving since onset. The problem is controlled. Pertinent negatives include no blurred vision, chest pain, headaches or shortness of breath. Risk factors for coronary artery disease include dyslipidemia, male gender and obesity. Compliance problems include diet.  Identifiable causes of hypertension include chronic renal disease.     Past Medical History:  Diagnosis Date  . Chronic renal insufficiency    WITH BASELINE CREATININE OF 1.6  . Chronic systolic CHF (congestive heart failure) ([place])   . Complete heart block (HCC)    s/p PPM by Dr Verlon Setting  . Coronary disease    NONOBSTRUCTIVE  . GERD (gastroesophageal reflux disease)   . Gout   . Hyperkalemia   . Hypertension   . Nonischemic cardiomyopathy ([place])   . Presence of permanent cardiac pacemaker      Family History  Problem Relation Age of Onset  . Diabetes Father      Current Outpatient Medications:  .  ADVAIR DISKUS 250-50  MCG/DOSE AEPB, INHALE 1 PUFF TWICE A DAY IN MORNING AND EVENING APPROXIMATELY 12 HOURS APART, Disp: 180 each, Rfl: 1 .  allopurinol (ZYLOPRIM) 100 MG tablet, TAKE 1 TABLET BY MOUTH EVERY DAY, Disp: 90 tablet, Rfl: 1 .  Ascorbic Acid (VITAMIN C) 1000 MG tablet, Take 2,000 mg by mouth daily. , Disp: , Rfl:  .  aspirin EC 81 MG tablet, Take 81 mg by mouth daily., Disp: , Rfl:  .  carvedilol (COREG) 12.5 MG tablet, Take 1 tablet (12.5 mg total) by mouth 2 (two) times daily., Disp: 180 tablet, Rfl: 3 .  Cholecalciferol (VITAMIN D-3) 1000 UNITS CAPS, Take 2,000 Units by mouth daily. , Disp: , Rfl:  .  Colchicine 0.6 MG CAPS, Take 0.6 mg by mouth daily as needed (gout). , Disp: , Rfl:  .  Fexofenadine HCl (ALLEGRA PO), Take 1 tablet by mouth daily as needed (itching)., Disp: , Rfl:  .  furosemide (LASIX) 40 MG tablet, TAKE 1 TABLET BY MOUTH EVERY DAY (Patient taking differently: Take 40 mg by mouth daily.), Disp: 90 tablet, Rfl: 1 .  Garlic 9480 MG CAPS, Take 3,000 mg by mouth daily., Disp: , Rfl:  .  hydrALAZINE (APRESOLINE) 25 MG tablet, Take 1 tablet (25 mg total) by mouth 3 (three) times daily., Disp: 270 tablet, Rfl: 3   Allergies  Allergen Reactions  . Avalide [Irbesartan-Hydrochlorothiazide] Anaphylaxis  . Penicillins Anaphylaxis    Did it involve swelling of the face/tongue/throat, SOB, or  low BP? Yes Did it involve sudden or severe rash/hives, skin peeling, or any reaction on the inside of your mouth or nose? Yes Did you need to seek medical attention at a hospital or doctor's office? Reaction occurred at MD office - penicillin injection When did it last happen?young adult If all above answers are "NO", may proceed with cephalosporin use.  Dorien Chihuahua [Tolnaftate] Hives and Rash          Men's preventive visit. Patient Health Questionnaire (PHQ-2) is  Flowsheet Row Clinical Support from 06/28/2020 in [facility]  PHQ-2 Total Score 0    . Patient is on a  healthy diet. Marital status: Divorced. Relevant history for alcohol use is:  Social History   Substance and Sexual Activity  Alcohol Use Yes  . Alcohol/week: 7.0 standard drinks  . Types: 7 Shots of liquor per week  . Relevant history for tobacco use is:  Social History   Tobacco Use  Smoking Status Former Smoker  . Packs/day: 1.00  . Years: 15.00  . Pack years: 15.00  . Types: Cigarettes  Smokeless Tobacco Never Used  Tobacco Comment   "quit smoking cigarettes in 1985"  .   Review of Systems  Constitutional: Negative.   HENT: Negative.   Eyes: Negative.  Negative for blurred vision.  Respiratory: Negative.  Negative for shortness of breath.   Cardiovascular: Negative.  Negative for chest pain.  Gastrointestinal: Negative.   Endocrine: Negative.   Genitourinary: Negative.   Musculoskeletal: Negative.   Skin: Negative.   Allergic/Immunologic: Negative.   Neurological: Positive for numbness. Negative for headaches.       C/o RUE numbness. He was sent to [facility] by [place]. He had EMG performed last Tuesday, now has diagnosis of carpal tunnel syndrome   Psychiatric/Behavioral: Negative.   All other systems reviewed and are negative.    Today's Vitals   08/28/20 1347  BP: 114/72  Pulse: 82  Temp: 98.1 F (36.7 C)  TempSrc: Oral  Weight: 263 lb 6.4 oz (119.5 kg)  Height: 5' 7.4" (1.712 m)  PainSc: 0-No pain   Body mass index is 40.77 kg/m.  Wt Readings from Last 3 Encounters:  08/28/20 263 lb 6.4 oz (119.5 kg)  06/28/20 250 lb (113.4 kg)  05/23/20 257 lb 3.2 oz (116.7 kg)   Objective:  Physical Exam Vitals and nursing note reviewed.  Constitutional:      Appearance: Normal appearance. He is obese.  HENT:     Head: Normocephalic and atraumatic.  Cardiovascular:     Rate and Rhythm: Normal rate and regular rhythm.     Heart sounds: Normal heart sounds.  Pulmonary:     Breath sounds: Normal breath sounds.  Musculoskeletal:     Cervical back: Normal range  of motion.  Skin:    General: Skin is warm.  Neurological:     General: No focal deficit present.     Mental Status: He is alert and oriented to person, place, and time.         Assessment And Plan:    1. 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 He will continue with current meds. He is encouraged to avoid adding salt to his foods.  - POCT Urinalysis Dipstick (81002) - POCT UA - Microalbumin - CMP14+EGFR  2. Complete heart block (HCC) Comments: Chronic, s/p pacemaker placement. This is stable. Encouraged to keep all Cardiology appts.   3. Stage 3 chronic kidney disease, unspecified  whether stage 3a or 3b CKD ([place]) Comments: Chronic, admits he has not had recent Nephrology evaluation.  - Protein electrophoresis, serum  4. Pure hypercholesterolemia Comments: Chronic, encouraged to avoid fried foods and increase daily activity.  - Lipid panel  5. Other abnormal glucose Comments: His a1c has been elevated in the past. I will recheck glucose/hba1c today. He is encouraged to avoid sugary beverages, including diet drinks.  - Hemoglobin A1c  6. 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 I will check uric acid level today. He denies having recent flare.  - Uric acid  7. Class 3 severe obesity due to excess calories with serious comorbidity and body mass index (BMI) of 40.0 to 44.9 in adult [facility]) Comments: BMI 40. Encouraged to aim for BMI 35 to decrease cardiac risk. Advised to aim for at least 150 minutes of exercise per week.  He is encouraged to initially strive for BMI less than 30 to decrease cardiac risk. He is advised to exercise no less than 150 minutes per week.     Patient was given opportunity to ask questions. Patient verbalized understanding of the plan and was able to repeat key elements of the plan. All questions were answered to their satisfaction.    [name], MD   I, [name], MD, have reviewed all documentation for this visit. The documentation on 09/03/20 for the exam, diagnosis, procedures, and orders are all accurate and complete.  THE PATIENT IS ENCOURAGED TO PRACTICE SOCIAL DISTANCING DUE TO THE COVID-19 PANDEMIC.

## 2020-08-28 NOTE — Patient Instructions (Signed)
Health Maintenance After Age 76 After age 76, you are at a higher risk for certain long-term diseases and infections as well as injuries from falls. Falls are a major cause of broken bones and head injuries in people who are older than age 76. Getting regular preventive care can help to keep you healthy and well. Preventive care includes getting regular testing and making lifestyle changes as recommended by your health care provider. Talk with your health care provider about:  Which screenings and tests you should have. A screening is a test that checks for a disease when you have no symptoms.  A diet and exercise plan that is right for you. What should I know about screenings and tests to prevent falls? Screening and testing are the best ways to find a health problem early. Early diagnosis and treatment give you the best chance of managing medical conditions that are common after age 76. Certain conditions and lifestyle choices may make you more likely to have a fall. Your health care provider may recommend:  Regular vision checks. Poor vision and conditions such as cataracts can make you more likely to have a fall. If you wear glasses, make sure to get your prescription updated if your vision changes.  Medicine review. Work with your health care provider to regularly review all of the medicines you are taking, including over-the-counter medicines. Ask your health care provider about any side effects that may make you more likely to have a fall. Tell your health care provider if any medicines that you take make you feel dizzy or sleepy.  Osteoporosis screening. Osteoporosis is a condition that causes the bones to get weaker. This can make the bones weak and cause them to break more easily.  Blood pressure screening. Blood pressure changes and medicines to control blood pressure can make you feel dizzy.  Strength and balance checks. Your health care provider may recommend certain tests to check your  strength and balance while standing, walking, or changing positions.  Foot health exam. Foot pain and numbness, as well as not wearing proper footwear, can make you more likely to have a fall.  Depression screening. You may be more likely to have a fall if you have a fear of falling, feel emotionally low, or feel unable to do activities that you used to do.  Alcohol use screening. Using too much alcohol can affect your balance and may make you more likely to have a fall. What actions can I take to lower my risk of falls? General instructions  Talk with your health care provider about your risks for falling. Tell your health care provider if: ? You fall. Be sure to tell your health care provider about all falls, even ones that seem minor. ? You feel dizzy, sleepy, or off-balance.  Take over-the-counter and prescription medicines only as told by your health care provider. These include any supplements.  Eat a healthy diet and maintain a healthy weight. A healthy diet includes low-fat dairy products, low-fat (lean) meats, and fiber from whole grains, beans, and lots of fruits and vegetables. Home safety  Remove any tripping hazards, such as rugs, cords, and clutter.  Install safety equipment such as grab bars in bathrooms and safety rails on stairs.  Keep rooms and walkways well-lit. Activity   Follow a regular exercise program to stay fit. This will help you maintain your balance. Ask your health care provider what types of exercise are appropriate for you.  If you need a cane or   walker, use it as recommended by your health care provider.  Wear supportive shoes that have nonskid soles. Lifestyle  Do not drink alcohol if your health care provider tells you not to drink.  If you drink alcohol, limit how much you have: ? 0-1 drink a day for women. ? 0-2 drinks a day for men.  Be aware of how much alcohol is in your drink. In the [place] one drink equals one typical bottle of beer (12  oz), one-half glass of wine (5 oz), or one shot of hard liquor (1 oz).  Do not use any products that contain nicotine or tobacco, such as cigarettes and e-cigarettes. If you need help quitting, ask your health care provider. Summary  Having a healthy lifestyle and getting preventive care can help to protect your health and wellness after age 76.  Screening and testing are the best way to find a health problem early and help you avoid having a fall. Early diagnosis and treatment give you the best chance for managing medical conditions that are more common for people who are older than age 76.  Falls are a major cause of broken bones and head injuries in people who are older than age 76. Take precautions to prevent a fall at home.  Work with your health care provider to learn what changes you can make to improve your health and wellness and to prevent falls. This information is not intended to replace advice given to you by your health care provider. Make sure you discuss any questions you have with your health care provider. Document Revised: 12/16/2018 Document Reviewed: 07/08/2017 Elsevier Patient Education  2020 Elsevier Inc.  

## 2020-08-30 LAB — CMP14+EGFR
ALT: 19 IU/L (ref 0–44)
AST: 18 IU/L (ref 0–40)
Albumin/Globulin Ratio: 1 — ABNORMAL LOW (ref 1.2–2.2)
Albumin: 3.9 g/dL (ref 3.7–4.7)
Alkaline Phosphatase: 290 IU/L — ABNORMAL HIGH (ref 44–121)
BUN/Creatinine Ratio: 19 (ref 10–24)
BUN: 28 mg/dL — ABNORMAL HIGH (ref 8–27)
Bilirubin Total: 0.6 mg/dL (ref 0.0–1.2)
CO2: 27 mmol/L (ref 20–29)
Calcium: 8.9 mg/dL (ref 8.6–10.2)
Chloride: 101 mmol/L (ref 96–106)
Creatinine, Ser: 1.44 mg/dL — ABNORMAL HIGH (ref 0.76–1.27)
GFR calc Af Amer: 54 mL/min/{1.73_m2} — ABNORMAL LOW (ref >59)
GFR calc non Af Amer: 47 mL/min/{1.73_m2} — ABNORMAL LOW (ref >59)
Globulin, Total: 3.8 g/dL (ref 1.5–4.5)
Glucose: 106 mg/dL — ABNORMAL HIGH (ref 65–99)
Potassium: 4.5 mmol/L (ref 3.5–5.2)
Sodium: 138 mmol/L (ref 134–144)
Total Protein: 7.7 g/dL (ref 6.0–8.5)

## 2020-08-30 LAB — PROTEIN ELECTROPHORESIS, SERUM
A/G Ratio: 0.9 (ref 0.7–1.7)
Albumin ELP: 3.6 g/dL (ref 2.9–4.4)
Alpha 1: 0.2 g/dL (ref 0.0–0.4)
Alpha 2: 0.5 g/dL (ref 0.4–1.0)
Beta: 1.1 g/dL (ref 0.7–1.3)
Gamma Globulin: 2.2 g/dL — ABNORMAL HIGH (ref 0.4–1.8)
Globulin, Total: 4.1 g/dL — ABNORMAL HIGH (ref 2.2–3.9)

## 2020-08-30 LAB — HEMOGLOBIN A1C
Est. average glucose Bld gHb Est-mCnc: 131 mg/dL
Hgb A1c MFr Bld: 6.2 % — ABNORMAL HIGH (ref 4.8–5.6)

## 2020-08-30 LAB — LIPID PANEL
Chol/HDL Ratio: 3.7 ratio (ref 0.0–5.0)
Cholesterol, Total: 221 mg/dL — ABNORMAL HIGH (ref 100–199)
HDL: 60 mg/dL (ref >39)
LDL Chol Calc (NIH): 147 mg/dL — ABNORMAL HIGH (ref 0–99)
Triglycerides: 81 mg/dL (ref 0–149)
VLDL Cholesterol Cal: 14 mg/dL (ref 5–40)

## 2020-08-30 LAB — URIC ACID: Uric Acid: 6.3 mg/dL (ref 3.8–8.4)

## 2020-09-17 ENCOUNTER — Other Ambulatory Visit: Payer: Self-pay | Admitting: Internal Medicine

## 2020-11-07 ENCOUNTER — Other Ambulatory Visit: Payer: Self-pay | Admitting: Internal Medicine

## 2020-11-17 NOTE — Progress Notes (Signed)
[name] Date of Birth: 03-29-1946   History of Present Illness: Mr. Trettel is seen today for followup of congestive heart failure. He was last seen by me in September 2016. He has been noncompliant with medical therapy and follow up here and in the device clinic. He has a history of complete heart block and presented in September of 2010 with a heart rate of 10. He had a permanent pacemaker placed at that time. He did have acute renal failure and hyperkalemia at the time. He was on either an ACE inhibitor or ARB at that time. In November 2014 he presented with worsening CHF.  Echocardiogram showed marked LV dysfunction with ejection fraction of 20-25%. He had moderate pulmonary HTN. He was started on nitrates and hydralazine. He underwent right and left heart cath on 08/16/13 with moderate pulmonary HTN and elevated filling pressures. 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   He declined ICD. In June 2016 he was found to have lead fractures of atrial and ventricular leads and underwent revision of pacemaker leads and upgrade to CRT device. Repeat Echo in September 2019 showed persistent LV dysfunction with EF 25-30%. Last device check 01/03/20 was normal.  He was seen in the ED in January 2021 with acute neck pain. CT head and neck with angio was negative. Also had CT chest/abd/pelvis. This did show coronary and aortic atherosclerosis without dissection.   On follow up today he states he feels great. No chest pain or dyspnea. No palpitations. He still has swelling in his ankles. He has gained weight up about 8 lbs. He states that is more related to drinking Scotch and not eating oatmeal anymore. He is taking lasix 40 mg daily, hydralazine, and Coreg. He states he is cutting back on his Etoh use. Planning to start a physical training program. Really wants to loose weight. Even thinking about lap band surgery.    Current Outpatient Medications on File Prior to Visit  Medication Sig  Dispense Refill   ADVAIR DISKUS 250-50 MCG/DOSE AEPB INHALE 1 PUFF TWICE A DAY IN MORNING AND EVENING APPROXIMATELY 12 HOURS APART 180 each 1   allopurinol (ZYLOPRIM) 100 MG tablet TAKE 1 TABLET BY MOUTH EVERY DAY 90 tablet 1   Ascorbic Acid (VITAMIN C) 1000 MG tablet Take 2,000 mg by mouth daily.      aspirin EC 81 MG tablet Take 81 mg by mouth daily.     Cholecalciferol (VITAMIN D-3) 1000 UNITS CAPS Take 2,000 Units by mouth daily.      Colchicine 0.6 MG CAPS Take 0.6 mg by mouth daily as needed (gout).      Fexofenadine HCl (ALLEGRA PO) Take 1 tablet by mouth daily as needed (itching).     furosemide (LASIX) 40 MG tablet Take 1 tablet (40 mg total) by mouth daily. 90 tablet 1   Garlic 1000 MG CAPS Take 3,000 mg by mouth daily.     hydrALAZINE (APRESOLINE) 25 MG tablet Take 1 tablet (25 mg total) by mouth 3 (three) times daily. 270 tablet 3   carvedilol (COREG) 12.5 MG tablet Take 1 tablet (12.5 mg total) by mouth 2 (two) times daily. 180 tablet 3   No current facility-administered medications on file prior to visit.    Allergies  Allergen Reactions   Avalide [Irbesartan-Hydrochlorothiazide] Anaphylaxis   Penicillins Anaphylaxis    Did it involve swelling of the face/tongue/throat, SOB, or low BP? Yes Did it involve sudden or severe rash/hives, skin  peeling, or any reaction on the inside of your mouth or nose? Yes Did you need to seek medical attention at a hospital or doctor's office? Reaction occurred at MD office - penicillin injection When did it last happen?young adult If all above answers are NO, may proceed with cephalosporin use.   Tinactin [Tolnaftate] Hives and Rash         Past Medical History:  Diagnosis Date   Chronic renal insufficiency    WITH BASELINE CREATININE OF 1.6   Chronic systolic CHF (congestive heart failure) (HCC)    Complete heart block (HCC)    s/p PPM by Dr Reyes Ivan   Coronary disease    NONOBSTRUCTIVE   GERD  (gastroesophageal reflux disease)    Gout    Hyperkalemia    Hypertension    Nonischemic cardiomyopathy (HCC)    Presence of permanent cardiac pacemaker     Past Surgical History:  Procedure Laterality Date   CARDIAC CATHETERIZATION  08/2013   EP IMPLANTABLE DEVICE N/A 02/15/2015   Procedure: Lead Revision/Repair;  Surgeon: Hillis Range, MD;  Location: [facility] INVASIVE CV LAB;  Service: Cardiovascular;  Laterality: N/A;   INGUINAL HERNIA REPAIR Bilateral    INSERT / REPLACE / REMOVE PACEMAKER  05/20/2009   MDT by Dr Reyes Ivan for complete heart block   LEFT AND RIGHT HEART CATHETERIZATION WITH CORONARY ANGIOGRAM N/A 08/16/2013   Procedure: LEFT AND RIGHT HEART CATHETERIZATION WITH CORONARY ANGIOGRAM;  Surgeon: Dartanion Teo M [place], MD;  Location: [facility] CATH LAB;  Service: Cardiovascular;  Laterality: N/A;    Social History   Tobacco Use  Smoking Status Former Smoker   Packs/day: 1.00   Years: 15.00   Pack years: 15.00   Types: Cigarettes  Smokeless Tobacco Never Used  Tobacco Comment   "quit smoking cigarettes in 1985"    Social History   Substance and Sexual Activity  Alcohol Use Yes   Alcohol/week: 7.0 standard drinks   Types: 7 Shots of liquor per week    Family History  Problem Relation Age of Onset   Diabetes Father     Review of Systems:  As note in HPI. All other systems were reviewed and are negative.  Physical Exam: BP 140/77    Pulse 79    Ht 5\' 9"  (1.753 m)    Wt 265 lb 6.4 oz (120.4 kg)    SpO2 97%    BMI 39.19 kg/m   GENERAL:  Well appearing overweight BM in NAD HEENT:  PERRL, EOMI, sclera are clear. Oropharynx is clear. NECK:  No jugular venous distention, carotid upstroke brisk and symmetric, no bruits, no thyromegaly or adenopathy LUNGS:  Clear to auscultation bilaterally CHEST:  Unremarkable HEART:  RRR,  PMI not displaced or sustained,S1 and S2 within normal limits, no S3, no S4: no clicks, no rubs, no murmurs ABD:  Soft, nontender. BS +, no  masses or bruits. No hepatomegaly, no splenomegaly EXT:  2 + pulses throughout, 2+ pretibial edema, no cyanosis no clubbing SKIN:  Warm and dry.  No rashes NEURO:  Alert and oriented x 3. Cranial nerves II through XII intact. PSYCH:  Cognitively intact    LABORATORY DATA: Lab Results  Component Value Date   WBC 6.8 09/11/2019   HGB 13.0 09/11/2019   HCT 41.1 09/11/2019   PLT 207 09/11/2019   GLUCOSE 106 (H) 08/28/2020   CHOL 221 (H) 08/28/2020   TRIG 81 08/28/2020   HDL 60 08/28/2020   LDLCALC 147 (H) 08/28/2020   ALT 19 08/28/2020  AST 18 08/28/2020   NA 138 08/28/2020   K 4.5 08/28/2020   CL 101 08/28/2020   CREATININE 1.44 (H) 08/28/2020   BUN 28 (H) 08/28/2020   CO2 27 08/28/2020   TSH 1.67 07/28/2013   INR 1.18 02/14/2015   HGBA1C 6.2 (H) 08/28/2020   MICROALBUR 10 08/28/2020    Dated 07/09/17: cholesterol 203, triglycerides 62, HDL 72, LDL 119.  Dated 01/06/18: creatinine 1.48. A1c 5.9%. Chemistries Normal   Ecg today shows NSR with BiV pacing rate 79. I have personally reviewed and interpreted this study.    Echo 06/02/18: Study Conclusions  - Left ventricle: The cavity size was moderately dilated. There was   moderate concentric hypertrophy. Systolic function was severely   reduced. The estimated ejection fraction was in the range of 25%   to 30%. Severe diffuse hypokinesis. There was an increased   relative contribution of atrial contraction to ventricular   filling. Doppler parameters are consistent with abnormal left   ventricular relaxation (grade 1 diastolic dysfunction). - Aortic valve: Trileaflet; moderately thickened, moderately   calcified leaflets. - Mitral valve: There was mild regurgitation. - Left atrium: The atrium was mildly dilated. - Right ventricle: Pacer wire or catheter noted in right ventricle.   Systolic function was mildly to moderately reduced. - Right atrium: Pacer wire or catheter noted in right atrium. - Pulmonary arteries:  Systolic pressure could not be accurately   estimated.  Assessment / Plan: 1. Chronic systolic congestive heart failure.  Ejection fraction of 25-30%. No improvement in Echo despite  CRT therapy.  Nonischemic.  He is intolerant to ACE inhibitors/ARBs related to hyperkalemia and acute renal failure.  He is on Coreg today 12.5 mg bid, hydralazine (unknown dose) and lasix. I recommended he be considered for an SGLT 2 inhibitor for his heart failure. [facility]   2. CKD stage 3. Followed by Dr. Hyman Hopes- Nephrology. Creatinine stable 1.44   3 HTN- control is improved on hydralazine.   4. Complete heart block status post permanent pacemaker. Now with CRT device. Last check 01/03/20 OK. He states this is now followed at the [place]  5. HLD. Discussed statin therapy but he doesn't want to take pills. States he can get his cholesterol down with diet.   6. Obesity  7. Aortic atherosclerosis. No obstructive CAD on prior cath. I will plan on follow up in 6 months

## 2020-11-21 ENCOUNTER — Other Ambulatory Visit: Payer: Self-pay

## 2020-11-21 ENCOUNTER — Ambulatory Visit (INDEPENDENT_AMBULATORY_CARE_PROVIDER_SITE_OTHER): Payer: Medicare HMO | Admitting: Cardiology

## 2020-11-21 ENCOUNTER — Encounter: Payer: Self-pay | Admitting: Cardiology

## 2020-11-21 VITALS — BP 140/77 | HR 79 | Ht 69.0 in | Wt 265.4 lb

## 2020-11-21 DIAGNOSIS — I5022 Chronic systolic (congestive) heart failure: Secondary | ICD-10-CM

## 2020-11-21 DIAGNOSIS — Z950 Presence of cardiac pacemaker: Secondary | ICD-10-CM

## 2020-11-21 DIAGNOSIS — I10 Essential (primary) hypertension: Secondary | ICD-10-CM

## 2020-11-21 DIAGNOSIS — I442 Atrioventricular block, complete: Secondary | ICD-10-CM

## 2020-11-21 NOTE — Patient Instructions (Signed)
Consider adding Marcelline Deist or Jardiance for your heart failure.   [facility] about this.

## 2021-01-10 DIAGNOSIS — Z6841 Body Mass Index (BMI) 40.0 and over, adult: Secondary | ICD-10-CM | POA: Diagnosis not present

## 2021-01-10 DIAGNOSIS — N2581 Secondary hyperparathyroidism of renal origin: Secondary | ICD-10-CM | POA: Diagnosis not present

## 2021-01-10 DIAGNOSIS — N183 Chronic kidney disease, stage 3 unspecified: Secondary | ICD-10-CM | POA: Diagnosis not present

## 2021-01-10 DIAGNOSIS — N189 Chronic kidney disease, unspecified: Secondary | ICD-10-CM | POA: Diagnosis not present

## 2021-01-10 DIAGNOSIS — I129 Hypertensive chronic kidney disease with stage 1 through stage 4 chronic kidney disease, or unspecified chronic kidney disease: Secondary | ICD-10-CM | POA: Diagnosis not present

## 2021-01-10 DIAGNOSIS — I509 Heart failure, unspecified: Secondary | ICD-10-CM | POA: Diagnosis not present

## 2021-01-10 LAB — COMPREHENSIVE METABOLIC PANEL
Albumin: 3.9 (ref 3.5–5.0)
Calcium: 9.4 (ref 8.7–10.7)
GFR calc Af Amer: 46

## 2021-01-10 LAB — BASIC METABOLIC PANEL
BUN: 26 — AB (ref 4–21)
CO2: 29 — AB (ref 13–22)
Chloride: 103 (ref 99–108)
Creatinine: 1.6 — AB (ref 0.6–1.3)
Glucose: 109
Potassium: 4.5 (ref 3.4–5.3)
Sodium: 139 (ref 137–147)

## 2021-01-10 LAB — TSH: TSH: 1.78 (ref 0.41–5.90)

## 2021-01-10 LAB — CBC AND DIFFERENTIAL: Hemoglobin: 12.8 — AB (ref 13.5–17.5)

## 2021-02-11 ENCOUNTER — Encounter: Payer: Self-pay | Admitting: Internal Medicine

## 2021-02-11 ENCOUNTER — Other Ambulatory Visit: Payer: Self-pay

## 2021-02-11 ENCOUNTER — Ambulatory Visit (INDEPENDENT_AMBULATORY_CARE_PROVIDER_SITE_OTHER): Payer: Medicare HMO | Admitting: Internal Medicine

## 2021-02-11 VITALS — BP 120/78 | HR 74 | Temp 98.3°F | Ht 67.2 in | Wt 260.2 lb

## 2021-02-11 DIAGNOSIS — Z23 Encounter for immunization: Secondary | ICD-10-CM | POA: Diagnosis not present

## 2021-02-11 DIAGNOSIS — N1831 Chronic kidney disease, stage 3a: Secondary | ICD-10-CM | POA: Diagnosis not present

## 2021-02-11 DIAGNOSIS — I442 Atrioventricular block, complete: Secondary | ICD-10-CM | POA: Diagnosis not present

## 2021-02-11 DIAGNOSIS — E7800 Pure hypercholesterolemia, unspecified: Secondary | ICD-10-CM

## 2021-02-11 DIAGNOSIS — Z6841 Body Mass Index (BMI) 40.0 and over, adult: Secondary | ICD-10-CM | POA: Diagnosis not present

## 2021-02-11 DIAGNOSIS — Z125 Encounter for screening for malignant neoplasm of prostate: Secondary | ICD-10-CM | POA: Diagnosis not present

## 2021-02-11 DIAGNOSIS — Z0000 Encounter for general adult medical examination without abnormal findings: Secondary | ICD-10-CM | POA: Diagnosis not present

## 2021-02-11 DIAGNOSIS — I130 Hypertensive heart and chronic kidney disease with heart failure and stage 1 through stage 4 chronic kidney disease, or unspecified chronic kidney disease: Secondary | ICD-10-CM

## 2021-02-11 DIAGNOSIS — E66813 Obesity, class 3: Secondary | ICD-10-CM | POA: Insufficient documentation

## 2021-02-11 DIAGNOSIS — R748 Abnormal levels of other serum enzymes: Secondary | ICD-10-CM | POA: Diagnosis not present

## 2021-02-11 DIAGNOSIS — R7303 Prediabetes: Secondary | ICD-10-CM | POA: Diagnosis not present

## 2021-02-11 LAB — POCT URINALYSIS DIPSTICK
Bilirubin, UA: NEGATIVE
Blood, UA: NEGATIVE
Glucose, UA: NEGATIVE
Ketones, UA: NEGATIVE
Nitrite, UA: NEGATIVE
Protein, UA: NEGATIVE
Spec Grav, UA: 1.025 (ref 1.010–1.025)
Urobilinogen, UA: 0.2 E.U./dL
pH, UA: 6 (ref 5.0–8.0)

## 2021-02-11 LAB — POCT UA - MICROALBUMIN
Creatinine, POC: 300 mg/dL
Microalbumin Ur, POC: 80 mg/L

## 2021-02-11 NOTE — Patient Instructions (Signed)
Health Maintenance After Age 77 After age 77, you are at a higher risk for certain long-term diseases and infections as well as injuries from falls. Falls are a major cause of broken bones and head injuries in people who are older than age 77. Getting regular preventive care can help to keep you healthy and well. Preventive care includes getting regular testing and making lifestyle changes as recommended by your health care provider. Talk with your health care provider about:  Which screenings and tests you should have. A screening is a test that checks for a disease when you have no symptoms.  A diet and exercise plan that is right for you. What should I know about screenings and tests to prevent falls? Screening and testing are the best ways to find a health problem early. Early diagnosis and treatment give you the best chance of managing medical conditions that are common after age 77. Certain conditions and lifestyle choices may make you more likely to have a fall. Your health care provider may recommend:  Regular vision checks. Poor vision and conditions such as cataracts can make you more likely to have a fall. If you wear glasses, make sure to get your prescription updated if your vision changes.  Medicine review. Work with your health care provider to regularly review all of the medicines you are taking, including over-the-counter medicines. Ask your health care provider about any side effects that may make you more likely to have a fall. Tell your health care provider if any medicines that you take make you feel dizzy or sleepy.  Osteoporosis screening. Osteoporosis is a condition that causes the bones to get weaker. This can make the bones weak and cause them to break more easily.  Blood pressure screening. Blood pressure changes and medicines to control blood pressure can make you feel dizzy.  Strength and balance checks. Your health care provider may recommend certain tests to check your  strength and balance while standing, walking, or changing positions.  Foot health exam. Foot pain and numbness, as well as not wearing proper footwear, can make you more likely to have a fall.  Depression screening. You may be more likely to have a fall if you have a fear of falling, feel emotionally low, or feel unable to do activities that you used to do.  Alcohol use screening. Using too much alcohol can affect your balance and may make you more likely to have a fall. What actions can I take to lower my risk of falls? General instructions  Talk with your health care provider about your risks for falling. Tell your health care provider if: ? You fall. Be sure to tell your health care provider about all falls, even ones that seem minor. ? You feel dizzy, sleepy, or off-balance.  Take over-the-counter and prescription medicines only as told by your health care provider. These include any supplements.  Eat a healthy diet and maintain a healthy weight. A healthy diet includes low-fat dairy products, low-fat (lean) meats, and fiber from whole grains, beans, and lots of fruits and vegetables. Home safety  Remove any tripping hazards, such as rugs, cords, and clutter.  Install safety equipment such as grab bars in bathrooms and safety rails on stairs.  Keep rooms and walkways well-lit. Activity  Follow a regular exercise program to stay fit. This will help you maintain your balance. Ask your health care provider what types of exercise are appropriate for you.  If you need a cane or walker,   use it as recommended by your health care provider.  Wear supportive shoes that have nonskid soles.   Lifestyle  Do not drink alcohol if your health care provider tells you not to drink.  If you drink alcohol, limit how much you have: ? 0-1 drink a day for women. ? 0-2 drinks a day for men.  Be aware of how much alcohol is in your drink. In the [place] one drink equals one typical bottle of beer (12  oz), one-half glass of wine (5 oz), or one shot of hard liquor (1 oz).  Do not use any products that contain nicotine or tobacco, such as cigarettes and e-cigarettes. If you need help quitting, ask your health care provider. Summary  Having a healthy lifestyle and getting preventive care can help to protect your health and wellness after age 77.  Screening and testing are the best way to find a health problem early and help you avoid having a fall. Early diagnosis and treatment give you the best chance for managing medical conditions that are more common for people who are older than age 77.  Falls are a major cause of broken bones and head injuries in people who are older than age 77. Take precautions to prevent a fall at home.  Work with your health care provider to learn what changes you can make to improve your health and wellness and to prevent falls. This information is not intended to replace advice given to you by your health care provider. Make sure you discuss any questions you have with your health care provider. Document Revised: 12/16/2018 Document Reviewed: 07/08/2017 Elsevier Patient Education  2021 Elsevier Inc.  

## 2021-02-11 NOTE — Progress Notes (Signed)
I,Katawbba Wiggins,acting as a Education administrator for [name], MD.,have documented all relevant documentation on the behalf of [name], MD,as directed by  [name], MD while in the presence of [name], MD.  This visit occurred during the SARS-CoV-2 public health emergency.  Safety protocols were in place, including screening questions prior to the visit, additional usage of staff PPE, and extensive cleaning of exam room while observing appropriate contact time as indicated for disinfecting solutions.  Subjective:     Patient ID: [name] , male    DOB: 21-Nov-1945 , 75 y.o.   MRN: [id number]   Chief Complaint  Patient presents with   Annual Exam   Hypertension    HPI  He is here today for a full physical examination. He has no specific concerns or complaints at this time. He is also [facility] by [facility].   Hypertension This is a chronic problem. The current episode started more than 1 year ago. The problem has been gradually improving since onset. The problem is uncontrolled. Pertinent negatives include no blurred vision, chest pain, palpitations or shortness of breath. Risk factors for coronary artery disease include dyslipidemia, obesity and sedentary lifestyle. The current treatment provides moderate improvement. Compliance problems include exercise.  Hypertensive end-organ damage includes kidney disease.    Past Medical History:  Diagnosis Date   Chronic renal insufficiency    WITH BASELINE CREATININE OF 1.6   Chronic systolic CHF (congestive heart failure) (HCC)    Complete heart block (HCC)    s/p PPM by Dr Verlon Setting   Coronary disease    NONOBSTRUCTIVE   GERD (gastroesophageal reflux disease)    Gout    Hyperkalemia    Hypertension    Nonischemic cardiomyopathy (HCC)    Presence of permanent cardiac pacemaker      Family History  Problem Relation Age of Onset   Diabetes Father      Current Outpatient Medications:    ADVAIR DISKUS  250-50 MCG/DOSE AEPB, INHALE 1 PUFF TWICE A DAY IN MORNING AND EVENING APPROXIMATELY 12 HOURS APART, Disp: 180 each, Rfl: 1   allopurinol (ZYLOPRIM) 100 MG tablet, TAKE 1 TABLET BY MOUTH EVERY DAY, Disp: 90 tablet, Rfl: 1   Ascorbic Acid (VITAMIN C) 1000 MG tablet, Take 2,000 mg by mouth daily. , Disp: , Rfl:    aspirin EC 81 MG tablet, Take 81 mg by mouth daily., Disp: , Rfl:    carvedilol (COREG) 12.5 MG tablet, Take 1 tablet (12.5 mg total) by mouth 2 (two) times daily., Disp: 180 tablet, Rfl: 3   Cholecalciferol (VITAMIN D-3) 1000 UNITS CAPS, Take 2,000 Units by mouth daily. , Disp: , Rfl:    Fexofenadine HCl (ALLEGRA PO), Take 1 tablet by mouth daily as needed (itching)., Disp: , Rfl:    furosemide (LASIX) 40 MG tablet, Take 1 tablet (40 mg total) by mouth daily., Disp: 90 tablet, Rfl: 1   Garlic 8850 MG CAPS, Take 3,000 mg by mouth daily., Disp: , Rfl:    hydrALAZINE (APRESOLINE) 25 MG tablet, Take 1 tablet (25 mg total) by mouth 3 (three) times daily., Disp: 270 tablet, Rfl: 3   Colchicine 0.6 MG CAPS, Take 0.6 mg by mouth daily as needed (gout).  (Patient not taking: Reported on 02/11/2021), Disp: , Rfl:    Allergies  Allergen Reactions   Avalide [Irbesartan-Hydrochlorothiazide] Anaphylaxis   Penicillins Anaphylaxis    Did it involve swelling of the face/tongue/throat, SOB, or low BP? Yes Did it  involve sudden or severe rash/hives, skin peeling, or any reaction on the inside of your mouth or nose? Yes Did you need to seek medical attention at a hospital or doctor's office? Reaction occurred at MD office - penicillin injection When did it last happen?      young adult If all above answers are "NO", may proceed with cephalosporin use.   Tinactin [Tolnaftate] Hives and Rash          Men's preventive visit. Patient Health Questionnaire (PHQ-2) is  Flowsheet Row Clinical Support from 06/28/2020 in [facility]  PHQ-2 Total Score 0     . Patient is on a "healthy"  diet. Marital status: Divorced. Relevant history for alcohol use is:  Social History   Substance and Sexual Activity  Alcohol Use Yes   Alcohol/week: 7.0 standard drinks   Types: 7 Shots of liquor per week  . Relevant history for tobacco use is:  Social History   Tobacco Use  Smoking Status Former   Packs/day: 1.00   Years: 15.00   Pack years: 15.00   Types: Cigarettes  Smokeless Tobacco Never  Tobacco Comments   "quit smoking cigarettes in 1985"  .   Review of Systems  Constitutional: Negative.   HENT: Negative.    Eyes: Negative.  Negative for blurred vision.  Respiratory: Negative.  Negative for shortness of breath.   Cardiovascular: Negative.  Negative for chest pain and palpitations.  Endocrine: Negative.   Genitourinary: Negative.   Musculoskeletal: Negative.   Skin: Negative.   Allergic/Immunologic: Negative.   Neurological: Negative.   Hematological: Negative.   Psychiatric/Behavioral: Negative.      Today's Vitals   02/11/21 0955  BP: 120/78  Pulse: 74  Temp: 98.3 F (36.8 C)  TempSrc: Oral  Weight: 260 lb 3.2 oz (118 kg)  Height: 5' 7.2" (1.707 m)  PainSc: 0-No pain   Body mass index is 40.51 kg/m.  Wt Readings from Last 3 Encounters:  02/11/21 260 lb 3.2 oz (118 kg)  11/21/20 265 lb 6.4 oz (120.4 kg)  08/28/20 263 lb 6.4 oz (119.5 kg)   BP Readings from Last 3 Encounters:  02/11/21 120/78  11/21/20 140/77  08/28/20 114/72   Objective:  Physical Exam Vitals and nursing note reviewed.  Constitutional:      Appearance: Normal appearance.  HENT:     Head: Normocephalic and atraumatic.     Right Ear: Tympanic membrane, ear canal and external ear normal.     Left Ear: Tympanic membrane, ear canal and external ear normal.     Nose:     Comments: Masked     Mouth/Throat:     Comments: Masked  Eyes:     Extraocular Movements: Extraocular movements intact.     Conjunctiva/sclera: Conjunctivae normal.     Pupils: Pupils are equal, round, and  reactive to light.  Cardiovascular:     Rate and Rhythm: Normal rate and regular rhythm.     Pulses: Normal pulses.     Heart sounds: Normal heart sounds.     Comments: B/l LE venous varicosities Pulmonary:     Effort: Pulmonary effort is normal.     Breath sounds: Normal breath sounds.  Chest:  Breasts:    Right: Normal. No swelling, bleeding, inverted nipple, mass or nipple discharge.     Left: Normal. No swelling, bleeding, inverted nipple, mass or nipple discharge.     Comments: Left anterior chest - ppm placed Abdominal:     General: Bowel  sounds are normal.     Palpations: Abdomen is soft.     Comments: Obese, distended. Umbilical hernia  Genitourinary:    Prostate: Normal.     Rectum: Normal. Guaiac result negative.  Musculoskeletal:        General: Normal range of motion.     Cervical back: Normal range of motion and neck supple.     Right lower leg: Edema present.     Left lower leg: Edema present.  Skin:    General: Skin is warm.  Neurological:     General: No focal deficit present.     Mental Status: He is alert.  Psychiatric:        Mood and Affect: Mood normal.        Behavior: Behavior normal.        Assessment And Plan:    1. Routine general medical examination at health care facility Comments: A full exam was performed. DRE deferred. PATIENT IS ADVISED TO GET 30-45 MINUTES REGULAR EXERCISE NO LESS THAN FOUR TO FIVE DAYS PER WEEK - BOTH WEIGHTBEARING EXERCISES AND AEROBIC ARE RECOMMENDED.  PATIENT IS ADVISED TO FOLLOW A HEALTHY DIET WITH AT LEAST SIX FRUITS/VEGGIES PER DAY, DECREASE INTAKE OF RED MEAT, AND TO INCREASE FISH INTAKE TO TWO DAYS PER WEEK.  MEATS/FISH SHOULD NOT BE FRIED, BAKED OR BROILED IS PREFERABLE.  IT IS ALSO IMPORTANT TO CUT BACK ON YOUR SUGAR INTAKE. PLEASE AVOID ANYTHING WITH ADDED SUGAR, CORN SYRUP OR OTHER SWEETENERS. IF YOU MUST USE A SWEETENER, YOU CAN TRY STEVIA. IT IS ALSO IMPORTANT TO AVOID ARTIFICIALLY SWEETENERS AND DIET BEVERAGES.  LASTLY, I SUGGEST WEARING SPF 50 SUNSCREEN ON EXPOSED PARTS AND ESPECIALLY WHEN IN THE DIRECT SUNLIGHT FOR AN EXTENDED PERIOD OF TIME.  PLEASE AVOID FAST FOOD RESTAURANTS AND INCREASE YOUR WATER INTAKE.   2. 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 He is encouraged to follow low sodium diet.He will rto in six months for re-evaluation.  - CBC - CMP14+EGFR - Lipid panel - POCT Urinalysis Dipstick (81002) - POCT UA - Microalbumin  3. Stage 3a chronic kidney disease ([place]) Comments: Chronic, also followed by Nephrology.  He is encouraged to keep BP controlled and to stay well hydrated to decrease risk of progression of CKD.  - VITAMIN D 25 Hydroxy (Vit-D Deficiency, Fractures)  4. Complete heart block (HCC) Comments: He is s/p pacemaker. Again, most recent EKG reviewed.   5. Pure hypercholesterolemia Comments: LDL 147 in Dec 2021. He does not wish to take chol meds, increased risk of CAD was d/w pt. He prefers to work on diet/exercise.   6. Prediabetes Comments: HIs a1c has been elevated in the past. I will recheck an a1c today. Encouraged to limit intake of sweetened beverages, including diet drinks.  - Hemoglobin A1c  7. Class 3 severe obesity due to excess calories with serious comorbidity and body mass index (BMI) of 40.0 to 44.9 in adult [facility]) Comments: He is encouraged to strive for BMI less than 30 to decrease cardiac risk. Advised to aim for at least 150 minutes of exercise per week.  8. Need for vaccination Comments: He agrees to Pneunovax. Does not wish to get Shingrix vaccine.  - Pneumococcal polysaccharide vaccine 23-valent greater than or equal to 2yo subcutaneous/IM  9. Prostate cancer screening Comments: He declines DRE. He reports having prostate exams performed at the [place].   Patient was given opportunity to ask questions.  Patient verbalized understanding of the plan and  was able to repeat key elements of the plan. All questions were answered to their satisfaction.   I, [name], MD, have reviewed all documentation for this visit. The documentation on 02/11/21 for the exam, diagnosis, procedures, and orders are all accurate and complete.  THE PATIENT IS ENCOURAGED TO PRACTICE SOCIAL DISTANCING DUE TO THE COVID-19 PANDEMIC.

## 2021-02-12 LAB — CBC
Hematocrit: 40.6 % (ref 37.5–51.0)
Hemoglobin: 13.3 g/dL (ref 13.0–17.7)
MCH: 29.5 pg (ref 26.6–33.0)
MCHC: 32.8 g/dL (ref 31.5–35.7)
MCV: 90 fL (ref 79–97)
Platelets: 190 10*3/uL (ref 150–450)
RBC: 4.51 x10E6/uL (ref 4.14–5.80)
RDW: 13.5 % (ref 11.6–15.4)
WBC: 5.6 10*3/uL (ref 3.4–10.8)

## 2021-02-12 LAB — HEMOGLOBIN A1C
Est. average glucose Bld gHb Est-mCnc: 126 mg/dL
Hgb A1c MFr Bld: 6 % — ABNORMAL HIGH (ref 4.8–5.6)

## 2021-02-12 LAB — LIPID PANEL
Chol/HDL Ratio: 3.6 ratio (ref 0.0–5.0)
Cholesterol, Total: 208 mg/dL — ABNORMAL HIGH (ref 100–199)
HDL: 57 mg/dL (ref >39)
LDL Chol Calc (NIH): 139 mg/dL — ABNORMAL HIGH (ref 0–99)
Triglycerides: 69 mg/dL (ref 0–149)
VLDL Cholesterol Cal: 12 mg/dL (ref 5–40)

## 2021-02-12 LAB — CMP14+EGFR
ALT: 16 IU/L (ref 0–44)
AST: 18 IU/L (ref 0–40)
Albumin/Globulin Ratio: 1.2 (ref 1.2–2.2)
Albumin: 4.2 g/dL (ref 3.7–4.7)
Alkaline Phosphatase: 324 IU/L — ABNORMAL HIGH (ref 44–121)
BUN/Creatinine Ratio: 15 (ref 10–24)
BUN: 23 mg/dL (ref 8–27)
Bilirubin Total: 0.8 mg/dL (ref 0.0–1.2)
CO2: 24 mmol/L (ref 20–29)
Calcium: 9 mg/dL (ref 8.6–10.2)
Chloride: 101 mmol/L (ref 96–106)
Creatinine, Ser: 1.52 mg/dL — ABNORMAL HIGH (ref 0.76–1.27)
Globulin, Total: 3.5 g/dL (ref 1.5–4.5)
Glucose: 109 mg/dL — ABNORMAL HIGH (ref 65–99)
Potassium: 5 mmol/L (ref 3.5–5.2)
Sodium: 137 mmol/L (ref 134–144)
Total Protein: 7.7 g/dL (ref 6.0–8.5)
eGFR: 47 mL/min/{1.73_m2} — ABNORMAL LOW (ref >59)

## 2021-02-12 LAB — VITAMIN D 25 HYDROXY (VIT D DEFICIENCY, FRACTURES): Vit D, 25-Hydroxy: 29.6 ng/mL — ABNORMAL LOW (ref 30.0–100.0)

## 2021-02-14 LAB — ALKALINE PHOSPHATASE: Alkaline Phosphatase: 315 IU/L — ABNORMAL HIGH (ref 44–121)

## 2021-02-14 LAB — SPECIMEN STATUS REPORT

## 2021-02-18 ENCOUNTER — Other Ambulatory Visit: Payer: Self-pay

## 2021-02-18 ENCOUNTER — Other Ambulatory Visit: Payer: Medicare HMO

## 2021-02-18 DIAGNOSIS — R748 Abnormal levels of other serum enzymes: Secondary | ICD-10-CM | POA: Diagnosis not present

## 2021-02-20 LAB — ALKALINE PHOSPHATASE, ISOENZYMES
Alkaline Phosphatase: 313 IU/L — ABNORMAL HIGH (ref 44–121)
BONE FRACTION: 64 % (ref 12–68)
INTESTINAL FRAC.: 4 % (ref 0–18)
LIVER FRACTION: 32 % (ref 13–88)

## 2021-04-02 ENCOUNTER — Other Ambulatory Visit: Payer: Self-pay

## 2021-04-02 ENCOUNTER — Ambulatory Visit (INDEPENDENT_AMBULATORY_CARE_PROVIDER_SITE_OTHER): Payer: Medicare HMO

## 2021-04-02 DIAGNOSIS — Z23 Encounter for immunization: Secondary | ICD-10-CM | POA: Diagnosis not present

## 2021-04-05 ENCOUNTER — Other Ambulatory Visit: Payer: Self-pay | Admitting: Internal Medicine

## 2021-06-14 DIAGNOSIS — I129 Hypertensive chronic kidney disease with stage 1 through stage 4 chronic kidney disease, or unspecified chronic kidney disease: Secondary | ICD-10-CM | POA: Diagnosis not present

## 2021-06-14 DIAGNOSIS — I509 Heart failure, unspecified: Secondary | ICD-10-CM | POA: Diagnosis not present

## 2021-06-14 DIAGNOSIS — N183 Chronic kidney disease, stage 3 unspecified: Secondary | ICD-10-CM | POA: Diagnosis not present

## 2021-06-14 DIAGNOSIS — Z6841 Body Mass Index (BMI) 40.0 and over, adult: Secondary | ICD-10-CM | POA: Diagnosis not present

## 2021-06-14 DIAGNOSIS — N189 Chronic kidney disease, unspecified: Secondary | ICD-10-CM | POA: Diagnosis not present

## 2021-06-14 DIAGNOSIS — N2581 Secondary hyperparathyroidism of renal origin: Secondary | ICD-10-CM | POA: Diagnosis not present

## 2021-07-04 ENCOUNTER — Ambulatory Visit (INDEPENDENT_AMBULATORY_CARE_PROVIDER_SITE_OTHER): Payer: Medicare HMO

## 2021-07-04 VITALS — Ht 69.0 in | Wt 258.0 lb

## 2021-07-04 DIAGNOSIS — Z0000 Encounter for general adult medical examination without abnormal findings: Secondary | ICD-10-CM

## 2021-07-04 NOTE — Patient Instructions (Signed)
[name] , Thank you for taking time to come for your Medicare Wellness Visit. I appreciate your ongoing commitment to your health goals. Please review the following plan we discussed and let me know if I can assist you in the future.   Screening recommendations/referrals: Colonoscopy: not required Recommended yearly ophthalmology/optometry visit for glaucoma screening and checkup Recommended yearly dental visit for hygiene and checkup  Vaccinations: Influenza vaccine: decline Pneumococcal vaccine: completed 02/11/2021 Tdap vaccine: due Shingles vaccine: discussed   Covid-19:  04/02/2021, 07/23/2020, 12/13/2019, 11/14/2019  Advanced directives: Advance directive discussed with you today. Even though you declined this today please call our office should you change your mind and we can give you the proper paperwork for you to fill out.  Conditions/risks identified: none  Next appointment: Follow up in one year for your annual wellness visit.   Preventive Care 28 Years and Older, Male Preventive care refers to lifestyle choices and visits with your health care provider that can promote health and wellness. What does preventive care include? A yearly physical exam. This is also called an annual well check. Dental exams once or twice a year. Routine eye exams. Ask your health care provider how often you should have your eyes checked. Personal lifestyle choices, including: Daily care of your teeth and gums. Regular physical activity. Eating a healthy diet. Avoiding tobacco and drug use. Limiting alcohol use. Practicing safe sex. Taking low doses of aspirin every day. Taking vitamin and mineral supplements as recommended by your health care provider. What happens during an annual well check? The services and screenings done by your health care provider during your annual well check will depend on your age, overall health, lifestyle risk factors, and family history of disease. Counseling   Your health care provider may ask you questions about your: Alcohol use. Tobacco use. Drug use. Emotional well-being. Home and relationship well-being. Sexual activity. Eating habits. History of falls. Memory and ability to understand (cognition). Work and work Astronomer. Screening  You may have the following tests or measurements: Height, weight, and BMI. Blood pressure. Lipid and cholesterol levels. These may be checked every 5 years, or more frequently if you are over 75 years old. Skin check. Lung cancer screening. You may have this screening every year starting at age 77 if you have a 30-pack-year history of smoking and currently smoke or have quit within the past 15 years. Fecal occult blood test (FOBT) of the stool. You may have this test every year starting at age 14. Flexible sigmoidoscopy or colonoscopy. You may have a sigmoidoscopy every 5 years or a colonoscopy every 10 years starting at age 84. Prostate cancer screening. Recommendations will vary depending on your family history and other risks. Hepatitis C blood test. Hepatitis B blood test. Sexually transmitted disease (STD) testing. Diabetes screening. This is done by checking your blood sugar (glucose) after you have not eaten for a while (fasting). You may have this done every 1-3 years. Abdominal aortic aneurysm (AAA) screening. You may need this if you are a current or former smoker. Osteoporosis. You may be screened starting at age 5 if you are at high risk. Talk with your health care provider about your test results, treatment options, and if necessary, the need for more tests. Vaccines  Your health care provider may recommend certain vaccines, such as: Influenza vaccine. This is recommended every year. Tetanus, diphtheria, and acellular pertussis (Tdap, Td) vaccine. You may need a Td booster every 10 years. Zoster vaccine. You may need  this after age 23. Pneumococcal 13-valent conjugate (PCV13) vaccine.  One dose is recommended after age 65. Pneumococcal polysaccharide (PPSV23) vaccine. One dose is recommended after age 32. Talk to your health care provider about which screenings and vaccines you need and how often you need them. This information is not intended to replace advice given to you by your health care provider. Make sure you discuss any questions you have with your health care provider. Document Released: 09/21/2015 Document Revised: 05/14/2016 Document Reviewed: 06/26/2015 Elsevier Interactive Patient Education  2017 [place] Prevention in the Home Falls can cause injuries. They can happen to people of all ages. There are many things you can do to make your home safe and to help prevent falls. What can I do on the outside of my home? Regularly fix the edges of walkways and driveways and fix any cracks. Remove anything that might make you trip as you walk through a door, such as a raised step or threshold. Trim any bushes or trees on the path to your home. Use bright outdoor lighting. Clear any walking paths of anything that might make someone trip, such as rocks or tools. Regularly check to see if handrails are loose or broken. Make sure that both sides of any steps have handrails. Any raised decks and porches should have guardrails on the edges. Have any leaves, snow, or ice cleared regularly. Use sand or salt on walking paths during winter. Clean up any spills in your garage right away. This includes oil or grease spills. What can I do in the bathroom? Use night lights. Install grab bars by the toilet and in the tub and shower. Do not use towel bars as grab bars. Use non-skid mats or decals in the tub or shower. If you need to sit down in the shower, use a plastic, non-slip stool. Keep the floor dry. Clean up any water that spills on the floor as soon as it happens. Remove soap buildup in the tub or shower regularly. Attach bath mats securely with double-sided  non-slip rug tape. Do not have throw rugs and other things on the floor that can make you trip. What can I do in the bedroom? Use night lights. Make sure that you have a light by your bed that is easy to reach. Do not use any sheets or blankets that are too big for your bed. They should not hang down onto the floor. Have a firm chair that has side arms. You can use this for support while you get dressed. Do not have throw rugs and other things on the floor that can make you trip. What can I do in the kitchen? Clean up any spills right away. Avoid walking on wet floors. Keep items that you use a lot in easy-to-reach places. If you need to reach something above you, use a strong step stool that has a grab bar. Keep electrical cords out of the way. Do not use floor polish or wax that makes floors slippery. If you must use wax, use non-skid floor wax. Do not have throw rugs and other things on the floor that can make you trip. What can I do with my stairs? Do not leave any items on the stairs. Make sure that there are handrails on both sides of the stairs and use them. Fix handrails that are broken or loose. Make sure that handrails are as long as the stairways. Check any carpeting to make sure that it is firmly attached to  the stairs. Fix any carpet that is loose or worn. Avoid having throw rugs at the top or bottom of the stairs. If you do have throw rugs, attach them to the floor with carpet tape. Make sure that you have a light switch at the top of the stairs and the bottom of the stairs. If you do not have them, ask someone to add them for you. What else can I do to help prevent falls? Wear shoes that: Do not have high heels. Have rubber bottoms. Are comfortable and fit you well. Are closed at the toe. Do not wear sandals. If you use a stepladder: Make sure that it is fully opened. Do not climb a closed stepladder. Make sure that both sides of the stepladder are locked into place. Ask  someone to hold it for you, if possible. Clearly mark and make sure that you can see: Any grab bars or handrails. First and last steps. Where the edge of each step is. Use tools that help you move around (mobility aids) if they are needed. These include: Canes. Walkers. Scooters. Crutches. Turn on the lights when you go into a dark area. Replace any light bulbs as soon as they burn out. Set up your furniture so you have a clear path. Avoid moving your furniture around. If any of your floors are uneven, fix them. If there are any pets around you, be aware of where they are. Review your medicines with your doctor. Some medicines can make you feel dizzy. This can increase your chance of falling. Ask your doctor what other things that you can do to help prevent falls. This information is not intended to replace advice given to you by your health care provider. Make sure you discuss any questions you have with your health care provider. Document Released: 06/21/2009 Document Revised: 01/31/2016 Document Reviewed: 09/29/2014 Elsevier Interactive Patient Education  2017 [employer].

## 2021-07-04 NOTE — Progress Notes (Signed)
I connected with [name] today by telephone and verified that I am speaking with the correct person using two identifiers. Location patient: home Location provider: work Persons participating in the virtual visit: [name], Gregory Ponder LPN.   I discussed the limitations, risks, security and privacy concerns of performing an evaluation and management service by telephone and the availability of in person appointments. I also discussed with the patient that there may be a patient responsible charge related to this service. The patient expressed understanding and verbally consented to this telephonic visit.    Interactive audio and video telecommunications were attempted between this provider and patient, however failed, due to patient having technical difficulties OR patient did not have access to video capability.  We continued and completed visit with audio only.     Vital signs may be patient reported or missing.  Subjective:   [name] is a 75 y.o. male who presents for Medicare Annual/Subsequent preventive examination.  Review of Systems     Cardiac Risk Factors include: advanced age (>71men, >44 women);hypertension;obesity (BMI >30kg/m2)     Objective:    Today's Vitals   07/04/21 0856  Weight: 258 lb (117 kg)  Height: 5\' 9"  (1.753 m)   Body mass index is 38.1 kg/m.  Advanced Directives 07/04/2021 06/28/2020 09/21/2019 08/17/2019 07/10/2017 02/14/2015 08/16/2013  Does Patient Have a Medical Advance Directive? No No Yes No No No Patient does not have advance directive  Type of Advance Directive - - Living will;Healthcare Power of Attorney - - - -  Copy of Healthcare Power of Attorney in Chart? - - No - copy requested - - - -  Would patient like information on creating a medical advance directive? - - - No - Patient declined No - Patient declined No - patient declined information -  Pre-existing out of facility DNR order (yellow form or pink MOST form) - - - -  - - No    Current Medications (verified) Outpatient Encounter Medications as of 07/04/2021  Medication Sig   allopurinol (ZYLOPRIM) 100 MG tablet TAKE 1 TABLET BY MOUTH EVERY DAY   Ascorbic Acid (VITAMIN C) 1000 MG tablet Take 2,000 mg by mouth daily.    aspirin EC 81 MG tablet Take 81 mg by mouth daily.   Cholecalciferol (VITAMIN D-3) 1000 UNITS CAPS Take 2,000 Units by mouth daily.    Fexofenadine HCl (ALLEGRA PO) Take 1 tablet by mouth daily as needed (itching).   fluticasone-salmeterol (ADVAIR DISKUS) 250-50 MCG/ACT AEPB Inhale 1 puff twice a day in the morning and evening 12 hours apart   furosemide (LASIX) 40 MG tablet TAKE 1 TABLET BY MOUTH EVERY DAY   Garlic 1000 MG CAPS Take 3,000 mg by mouth daily.   carvedilol (COREG) 12.5 MG tablet Take 1 tablet (12.5 mg total) by mouth 2 (two) times daily.   Colchicine 0.6 MG CAPS Take 0.6 mg by mouth daily as needed (gout).  (Patient not taking: No sig reported)   hydrALAZINE (APRESOLINE) 25 MG tablet Take 1 tablet (25 mg total) by mouth 3 (three) times daily.   No facility-administered encounter medications on file as of 07/04/2021.    Allergies (verified) Avalide [irbesartan-hydrochlorothiazide], Penicillins, and Tinactin [tolnaftate]   History: Past Medical History:  Diagnosis Date   Chronic renal insufficiency    WITH BASELINE CREATININE OF 1.6   Chronic systolic CHF (congestive heart failure) (HCC)    Complete heart block (HCC)    s/p PPM by Dr 07/06/2021   Coronary  disease    NONOBSTRUCTIVE   GERD (gastroesophageal reflux disease)    Gout    Hyperkalemia    Hypertension    Nonischemic cardiomyopathy (HCC)    Presence of permanent cardiac pacemaker    Past Surgical History:  Procedure Laterality Date   CARDIAC CATHETERIZATION  08/2013   EP IMPLANTABLE DEVICE N/A 02/15/2015   Procedure: Lead Revision/Repair;  Surgeon: Hillis Range, MD;  Location: [facility] INVASIVE CV LAB;  Service: Cardiovascular;  Laterality: N/A;   INGUINAL  HERNIA REPAIR Bilateral    INSERT / REPLACE / REMOVE PACEMAKER  05/20/2009   MDT by Dr Reyes Ivan for complete heart block   LEFT AND RIGHT HEART CATHETERIZATION WITH CORONARY ANGIOGRAM N/A 08/16/2013   Procedure: LEFT AND RIGHT HEART CATHETERIZATION WITH CORONARY ANGIOGRAM;  Surgeon: [name] [place]az[place]and, MD;  Location: [facility] CATH LAB;  Service: Cardiovascular;  Laterality: N/A;   Family History  Problem Relation Age of Onset   Diabetes Father    Social History   Socioeconomic History   Marital status: Divorced    Spouse name: Not on file   Number of children: 1   Years of education: 14   Highest education level: Not on file  Occupational History   Occupation: Sports administrator: Biomedical scientist   Occupation: retired  Tobacco Use   Smoking status: Former    Packs/day: 1.00    Years: 15.00    Pack years: 15.00    Types: Cigarettes   Smokeless tobacco: Never   Tobacco comments:    "quit smoking cigarettes in 1985"  Vaping Use   Vaping Use: Never used  Substance and Sexual Activity   Alcohol use: Yes    Alcohol/week: 7.0 standard drinks    Types: 7 Shots of liquor per week   Drug use: No   Sexual activity: Not Currently  Other Topics Concern   Not on file  Social History Narrative   Lives in [place].   Drinks a couple of coffees a day   Lives at home alone with his dog   Social Determinants of Corporate investment banker Strain: Low Risk    Difficulty of Paying Living Expenses: Not hard at all  Food Insecurity: No Food Insecurity   Worried About Programme researcher, broadcasting/film/video in the Last Year: Never true   Barista in the Last Year: Never true  Transportation Needs: No Transportation Needs   Lack of Transportation (Medical): No   Lack of Transportation (Non-Medical): No  Physical Activity: Insufficiently Active   Days of Exercise per Week: 3 days   Minutes of Exercise per Session: 30 min  Stress: No Stress Concern Present   Feeling of Stress : Not at all  Social  Connections: Not on file    Tobacco Counseling Counseling given: Not Answered Tobacco comments: "quit smoking cigarettes in 1985"   Clinical Intake:  Pre-visit preparation completed: Yes  Pain : No/denies pain     Nutritional Status: BMI > 30  Obese Nutritional Risks: None Diabetes: No  How often do you need to have someone help you when you read instructions, pamphlets, or other written materials from your doctor or pharmacy?: 1 - Never What is the last grade level you completed in school?: bachelors degree  Diabetic? no  Interpreter Needed?: No  Information entered by :: NAllen LPN   Activities of Daily Living In your present state of health, do you have any difficulty performing the following activities: 07/04/2021  Hearing? N  Vision? N  Difficulty concentrating or making decisions? N  Walking or climbing stairs? N  Dressing or bathing? N  Doing errands, shopping? N  Preparing Food and eating ? N  Using the Toilet? N  In the past six months, have you accidently leaked urine? N  Do you have problems with loss of bowel control? N  Managing your Medications? N  Managing your Finances? N  Housekeeping or managing your Housekeeping? N  Some recent data might be hidden    Patient Care Team: Dorothyann Peng, MD as PCP - General (Internal Medicine)  Indicate any recent Medical Services you may have received from other than [facility] providers in the past year (date may be approximate).     Assessment:   This is a routine wellness examination for Gyan.  Hearing/Vision screen Vision Screening - Comments:: No regular eye exams  Dietary issues and exercise activities discussed: Current Exercise Habits: Home exercise routine, Type of exercise: Other - see comments (bike riding), Time (Minutes): 30, Frequency (Times/Week): 3, Weekly Exercise (Minutes/Week): 90   Goals Addressed             This Visit's Progress    Patient Stated       07/04/2021, no goals        Depression Screen PHQ 2/9 Scores 07/04/2021 06/28/2020 08/17/2019 02/23/2019 08/10/2018  PHQ - 2 Score 0 0 0 3 0  PHQ- 9 Score - - 0 6 -    Fall Risk Fall Risk  07/04/2021 06/28/2020 08/17/2019 02/23/2019 08/10/2018  Falls in the past year? 0 0 0 0 0  Risk for fall due to : Medication side effect Medication side effect Medication side effect - -  Follow up Falls evaluation completed;Education provided;Falls prevention discussed Falls evaluation completed;Education provided;Falls prevention discussed Falls evaluation completed;Education provided;Falls prevention discussed - -    FALL RISK PREVENTION PERTAINING TO THE HOME:  Any stairs in or around the home? No  If so, are there any without handrails? N/a Home free of loose throw rugs in walkways, pet beds, electrical cords, etc? Yes  Adequate lighting in your home to reduce risk of falls? Yes   ASSISTIVE DEVICES UTILIZED TO PREVENT FALLS:  Life alert? No  Use of a cane, walker or w/c? No  Grab bars in the bathroom? No  Shower chair or bench in shower? No  Elevated toilet seat or a handicapped toilet? No   TIMED UP AND GO:  Was the test performed? No .      Cognitive Function:     6CIT Screen 06/28/2020 08/17/2019  What Year? 0 points 0 points  What month? 0 points 0 points  What time? 0 points 0 points  Count back from 20 0 points 0 points  Months in reverse 4 points 4 points  Repeat phrase 0 points 0 points  Total Score 4 4    Immunizations Immunization History  Administered Date(s) Administered   Moderna SARS-COV2 Booster Vaccination 04/02/2021   Moderna Sars-Covid-2 Vaccination 11/14/2019, 12/13/2019, 07/23/2020   Pneumococcal Polysaccharide-23 02/11/2021    TDAP status: Due, Education has been provided regarding the importance of this vaccine. Advised may receive this vaccine at local pharmacy or Health Dept. Aware to provide a copy of the vaccination record if obtained from local pharmacy or Health Dept.  Verbalized acceptance and understanding.  Flu Vaccine status: Declined, Education has been provided regarding the importance of this vaccine but patient still declined. Advised may receive this vaccine at local pharmacy or Health Dept.  Aware to provide a copy of the vaccination record if obtained from local pharmacy or Health Dept. Verbalized acceptance and understanding.  Pneumococcal vaccine status: Up to date  Covid-19 vaccine status: Completed vaccines  Qualifies for Shingles Vaccine? Yes   Zostavax completed No   Shingrix Completed?: No.    Education has been provided regarding the importance of this vaccine. Patient has been advised to call insurance company to determine out of pocket expense if they have not yet received this vaccine. Advised may also receive vaccine at local pharmacy or Health Dept. Verbalized acceptance and understanding.  Screening Tests Health Maintenance  Topic Date Due   TETANUS/TDAP  Never done   Zoster Vaccines- Shingrix (1 of 2) Never done   INFLUENZA VACCINE  Never done   COVID-19 Vaccine (4 - Booster for Moderna series) 05/28/2021   Pneumonia Vaccine 48+ Years old (2 - PCV) 02/11/2022   Hepatitis C Screening  Completed   HPV VACCINES  Aged Out    Health Maintenance  Health Maintenance Due  Topic Date Due   TETANUS/TDAP  Never done   Zoster Vaccines- Shingrix (1 of 2) Never done   INFLUENZA VACCINE  Never done   COVID-19 Vaccine (4 - Booster for Moderna series) 05/28/2021    Colorectal cancer screening: No longer required.   Lung Cancer Screening: (Low Dose CT Chest recommended if Age 23-80 years, 30 pack-year currently smoking OR have quit w/in 15years.) does not qualify.   Lung Cancer Screening Referral: no  Additional Screening:  Hepatitis C Screening: does qualify; Completed 08/10/2018  Vision Screening: Recommended annual ophthalmology exams for early detection of glaucoma and other disorders of the eye. Is the patient up to date with  their annual eye exam?  No  Who is the provider or what is the name of the office in which the patient attends annual eye exams? none If pt is not established with a provider, would they like to be referred to a provider to establish care? No .   Dental Screening: Recommended annual dental exams for proper oral hygiene  Community Resource Referral / Chronic Care Management: CRR required this visit?  No   CCM required this visit?  No      Plan:     I have personally reviewed and noted the following in the patient's chart:   Medical and social history Use of alcohol, tobacco or illicit drugs  Current medications and supplements including opioid prescriptions. Patient is not currently taking opioid prescriptions. Functional ability and status Nutritional status Physical activity Advanced directives List of other physicians Hospitalizations, surgeries, and ER visits in previous 12 months Vitals Screenings to include cognitive, depression, and falls Referrals and appointments  In addition, I have reviewed and discussed with patient certain preventive protocols, quality metrics, and best practice recommendations. A written personalized care plan for preventive services as well as general preventive health recommendations were provided to patient.     Barb Merino, LPN   54/27/0623   Nurse Notes: 6 CIT not administered. Patient appears cognitive per conversation.

## 2021-08-20 ENCOUNTER — Encounter: Payer: Self-pay | Admitting: Internal Medicine

## 2021-08-20 ENCOUNTER — Ambulatory Visit (INDEPENDENT_AMBULATORY_CARE_PROVIDER_SITE_OTHER): Payer: Medicare HMO | Admitting: Internal Medicine

## 2021-08-20 ENCOUNTER — Other Ambulatory Visit: Payer: Self-pay

## 2021-08-20 VITALS — BP 122/80 | HR 78 | Temp 98.5°F | Ht 66.6 in | Wt 270.4 lb

## 2021-08-20 DIAGNOSIS — Z6841 Body Mass Index (BMI) 40.0 and over, adult: Secondary | ICD-10-CM | POA: Diagnosis not present

## 2021-08-20 DIAGNOSIS — Z2821 Immunization not carried out because of patient refusal: Secondary | ICD-10-CM

## 2021-08-20 DIAGNOSIS — I442 Atrioventricular block, complete: Secondary | ICD-10-CM | POA: Diagnosis not present

## 2021-08-20 DIAGNOSIS — I130 Hypertensive heart and chronic kidney disease with heart failure and stage 1 through stage 4 chronic kidney disease, or unspecified chronic kidney disease: Secondary | ICD-10-CM | POA: Diagnosis not present

## 2021-08-20 DIAGNOSIS — R748 Abnormal levels of other serum enzymes: Secondary | ICD-10-CM

## 2021-08-20 DIAGNOSIS — Z23 Encounter for immunization: Secondary | ICD-10-CM | POA: Diagnosis not present

## 2021-08-20 DIAGNOSIS — R7309 Other abnormal glucose: Secondary | ICD-10-CM | POA: Diagnosis not present

## 2021-08-20 DIAGNOSIS — N1831 Chronic kidney disease, stage 3a: Secondary | ICD-10-CM | POA: Diagnosis not present

## 2021-08-20 DIAGNOSIS — M1A39X0 Chronic gout due to renal impairment, multiple sites, without tophus (tophi): Secondary | ICD-10-CM | POA: Diagnosis not present

## 2021-08-20 MED ORDER — ZOSTER VAC RECOMB ADJUVANTED 50 MCG/0.5ML IM SUSR: 0.5000 mL | Freq: Once | INTRAMUSCULAR | 0 refills | Status: AC

## 2021-08-20 NOTE — Progress Notes (Signed)
[name],acting as a Education administrator for Gregory Greenland, MD.,have documented all relevant documentation on the behalf of Gregory Greenland, MD,as directed by  Gregory Greenland, MD while in the presence of Gregory Greenland, MD.  This visit occurred during the SARS-CoV-2 public health emergency.  Safety protocols were in place, including screening questions prior to the visit, additional usage of staff PPE, and extensive cleaning of exam room while observing appropriate contact time as indicated for disinfecting solutions.  Subjective:     Patient ID: [name] , male    DOB: Jun 25, 1946 , 75 y.o.   MRN: [id number]   Chief Complaint  Patient presents with   Hypertension    HPI  Patient presents today for a bp check.  He reports compliance with meds. He denies headaches, chest pain and shortness of breath.    Quick recap per Cardiology: In November 2014 he presented with worsening CHF.  Echocardiogram showed marked LV dysfunction with ejection fraction of 20-25%. He had moderate pulmonary HTN. He was started on nitrates and hydralazine. He underwent right and left heart cath on 08/16/13 with moderate pulmonary HTN and elevated filling pressures.  He declined ICD. In June 2016 he was found to have lead fractures of atrial and ventricular leads and underwent revision of pacemaker leads and upgrade to CRT device. Repeat Echo in September 2019 showed persistent LV dysfunction with EF 25-30%. Last device check 01/03/20 was normal.  Hypertension This is a chronic problem. The current episode started more than 1 year ago. The problem has been gradually improving since onset. The problem is controlled. Pertinent negatives include no blurred vision, chest pain, headaches or shortness of breath. Risk factors for coronary artery disease include dyslipidemia, male gender and obesity. Compliance problems include diet.  Identifiable causes of hypertension include chronic renal disease.    Past Medical  History:  Diagnosis Date   Chronic renal insufficiency    WITH BASELINE CREATININE OF 1.6   Chronic systolic CHF (congestive heart failure) (HCC)    Complete heart block (HCC)    s/p PPM by Dr Verlon Setting   Coronary disease    NONOBSTRUCTIVE   GERD (gastroesophageal reflux disease)    Gout    Hyperkalemia    Hypertension    Nonischemic cardiomyopathy (HCC)    Presence of permanent cardiac pacemaker      Family History  Problem Relation Age of Onset   Diabetes Father      Current Outpatient Medications:    allopurinol (ZYLOPRIM) 100 MG tablet, TAKE 1 TABLET BY MOUTH EVERY DAY, Disp: 90 tablet, Rfl: 1   Ascorbic Acid (VITAMIN C) 1000 MG tablet, Take 2,000 mg by mouth daily. , Disp: , Rfl:    aspirin EC 81 MG tablet, Take 81 mg by mouth daily., Disp: , Rfl:    carvedilol (COREG) 12.5 MG tablet, Take 1 tablet (12.5 mg total) by mouth 2 (two) times daily., Disp: 180 tablet, Rfl: 3   Cholecalciferol (VITAMIN D-3) 1000 UNITS CAPS, Take 2,000 Units by mouth daily. , Disp: , Rfl:    Fexofenadine HCl (ALLEGRA PO), Take 1 tablet by mouth daily as needed (itching)., Disp: , Rfl:    fluticasone-salmeterol (ADVAIR DISKUS) 250-50 MCG/ACT AEPB, Inhale 1 puff twice a day in the morning and evening 12 hours apart, Disp: 180 each, Rfl: 1   furosemide (LASIX) 40 MG tablet, TAKE 1 TABLET BY MOUTH EVERY DAY, Disp: 90 tablet, Rfl: 1   Garlic 3254 MG CAPS, Take 3,000  mg by mouth daily., Disp: , Rfl:    hydrALAZINE (APRESOLINE) 25 MG tablet, Take 1 tablet (25 mg total) by mouth 3 (three) times daily., Disp: 270 tablet, Rfl: 3   Zoster Vaccine Adjuvanted (SHINGRIX) injection, Inject 0.5 mLs into the muscle once for 1 dose., Disp: 0.5 mL, Rfl: 0   Colchicine 0.6 MG CAPS, Take 0.6 mg by mouth daily as needed (gout).  (Patient not taking: Reported on 02/11/2021), Disp: , Rfl:    Allergies  Allergen Reactions   Avalide [Irbesartan-Hydrochlorothiazide] Anaphylaxis   Penicillins Anaphylaxis    Did it involve  swelling of the face/tongue/throat, SOB, or low BP? Yes Did it involve sudden or severe rash/hives, skin peeling, or any reaction on the inside of your mouth or nose? Yes Did you need to seek medical attention at a hospital or doctor's office? Reaction occurred at MD office - penicillin injection When did it last happen?      young adult If all above answers are "NO", may proceed with cephalosporin use.   Tinactin [Tolnaftate] Hives and Rash          Review of Systems  Constitutional: Negative.   Eyes:  Negative for blurred vision.  Respiratory: Negative.  Negative for shortness of breath.   Cardiovascular: Negative.  Negative for chest pain.  Gastrointestinal: Negative.   Neurological: Negative.  Negative for headaches.  Psychiatric/Behavioral: Negative.      Today's Vitals   08/20/21 0842  BP: 122/80  Pulse: 78  Temp: 98.5 F (36.9 C)  Weight: 270 lb 6.4 oz (122.7 kg)  Height: 5' 6.6" (1.692 m)  PainSc: 0-No pain   Body mass index is 42.86 kg/m.  Wt Readings from Last 3 Encounters:  08/20/21 270 lb 6.4 oz (122.7 kg)  07/04/21 258 lb (117 kg)  02/11/21 260 lb 3.2 oz (118 kg)     Objective:  Physical Exam Vitals and nursing note reviewed.  Constitutional:      Appearance: Normal appearance.  HENT:     Head: Normocephalic and atraumatic.     Nose:     Comments: Masked     Mouth/Throat:     Comments: Masked  Eyes:     Extraocular Movements: Extraocular movements intact.  Cardiovascular:     Rate and Rhythm: Normal rate and regular rhythm.     Heart sounds: Normal heart sounds.  Pulmonary:     Effort: Pulmonary effort is normal.     Breath sounds: Normal breath sounds.  Musculoskeletal:     Cervical back: Normal range of motion.  Skin:    General: Skin is warm.  Neurological:     General: No focal deficit present.     Mental Status: He is alert.  Psychiatric:        Mood and Affect: Mood normal.        Assessment And Plan:     1. 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 Encouraged to follow low sodium diet. No med changes today. I will check BNP today due to 12 lb weight gain.  - CMP14+EGFR  2. Stage 3a chronic kidney disease ([place]) Comments: Chronic, encouraged to follow low sodium diet, stay well hydrated and keep BP under optimal control <130/80. I will check renal function.  - CMP14+EGFR - Protein electrophoresis, serum - Parathyroid Hormone, Intact w/Ca - Phosphorus  3. Complete heart block (HCC) Comments: Chronic, as per Cardiology. He is s/p ppm.  I appreciate Cardiology input.  4. Other abnormal glucose Comments: His a1c has been elevated in the past. I will recheck an a1c today. He is encouraged to limit his intake of sweetened beverages.   5. Elevated alkaline phosphatase level Comments: I will check alk phos isoenzymes today.   6. Chronic gout due to renal impairment of multiple sites without tophus I will check uric acid levels. He is encouraged to take allopurinol as prescribed and avoid triggers.  - Uric acid  7. Class 3 severe obesity due to excess calories with serious comorbidity and body mass index (BMI) of 40.0 to 44.9 in adult [facility]) Comments: BMI 42. He was made aware of 12 pound weight gain. He is encouraged to strive for BMI less than 35 to decrease cardiac risk. Advised to aim for at least 150 minutes of exercise per week.   8. Immunization due Comments: I will send rx Shingrix to his local pharmacy.  - Zoster Vaccine Adjuvanted [facility]) injection; Inject 0.5 mLs into the muscle once for 1 dose.  Dispense: 0.5 mL; Refill: 0  9. Influenza vaccination declined   Patient was given opportunity to ask questions. Patient verbalized understanding of the plan and was able to repeat key elements of the plan. All questions were answered to their satisfaction.   I, Gregory Greenland, MD, have reviewed all documentation for this visit. The  documentation on 08/20/21 for the exam, diagnosis, procedures, and orders are all accurate and complete.   IF YOU HAVE BEEN REFERRED TO A SPECIALIST, IT MAY TAKE 1-2 WEEKS TO SCHEDULE/PROCESS THE REFERRAL. IF YOU HAVE NOT HEARD FROM US/SPECIALIST IN TWO WEEKS, PLEASE GIVE [place] A CALL AT [phone] X [phone].   THE PATIENT IS ENCOURAGED TO PRACTICE SOCIAL DISTANCING DUE TO THE COVID-19 PANDEMIC.

## 2021-08-20 NOTE — Patient Instructions (Signed)

## 2021-08-24 LAB — CMP14+EGFR
ALT: 21 IU/L (ref 0–44)
AST: 23 IU/L (ref 0–40)
Albumin/Globulin Ratio: 1.3 (ref 1.2–2.2)
Albumin: 4 g/dL (ref 3.7–4.7)
Alkaline Phosphatase: 306 IU/L — ABNORMAL HIGH (ref 44–121)
BUN/Creatinine Ratio: 17 (ref 10–24)
BUN: 25 mg/dL (ref 8–27)
Bilirubin Total: 0.8 mg/dL (ref 0.0–1.2)
CO2: 24 mmol/L (ref 20–29)
Calcium: 8.9 mg/dL (ref 8.6–10.2)
Chloride: 103 mmol/L (ref 96–106)
Creatinine, Ser: 1.48 mg/dL — ABNORMAL HIGH (ref 0.76–1.27)
Globulin, Total: 3.2 g/dL (ref 1.5–4.5)
Glucose: 115 mg/dL — ABNORMAL HIGH (ref 70–99)
Potassium: 4.9 mmol/L (ref 3.5–5.2)
Sodium: 139 mmol/L (ref 134–144)
Total Protein: 7.2 g/dL (ref 6.0–8.5)
eGFR: 48 mL/min/{1.73_m2} — ABNORMAL LOW (ref >59)

## 2021-08-24 LAB — PROTEIN ELECTROPHORESIS, SERUM
A/G Ratio: 0.9 (ref 0.7–1.7)
Albumin ELP: 3.4 g/dL (ref 2.9–4.4)
Alpha 1: 0.2 g/dL (ref 0.0–0.4)
Alpha 2: 0.6 g/dL (ref 0.4–1.0)
Beta: 1.1 g/dL (ref 0.7–1.3)
Gamma Globulin: 2 g/dL — ABNORMAL HIGH (ref 0.4–1.8)
Globulin, Total: 3.8 g/dL (ref 2.2–3.9)

## 2021-08-24 LAB — PHOSPHORUS: Phosphorus: 3.8 mg/dL (ref 2.8–4.1)

## 2021-08-24 LAB — ALKALINE PHOSPHATASE, ISOENZYMES
BONE FRACTION: 62 % (ref 12–68)
INTESTINAL FRAC.: 3 % (ref 0–18)
LIVER FRACTION: 35 % (ref 13–88)

## 2021-08-24 LAB — BRAIN NATRIURETIC PEPTIDE: BNP: 21.8 pg/mL (ref 0.0–100.0)

## 2021-08-24 LAB — PTH, INTACT AND CALCIUM: PTH: 65 pg/mL (ref 15–65)

## 2021-08-24 LAB — URIC ACID: Uric Acid: 6.1 mg/dL (ref 3.8–8.4)

## 2021-08-27 ENCOUNTER — Other Ambulatory Visit: Payer: Self-pay

## 2021-08-27 ENCOUNTER — Other Ambulatory Visit: Payer: Medicare HMO

## 2021-08-27 DIAGNOSIS — R748 Abnormal levels of other serum enzymes: Secondary | ICD-10-CM | POA: Diagnosis not present

## 2021-08-28 ENCOUNTER — Other Ambulatory Visit: Payer: Self-pay | Admitting: Internal Medicine

## 2021-08-28 DIAGNOSIS — R748 Abnormal levels of other serum enzymes: Secondary | ICD-10-CM

## 2021-08-28 LAB — GAMMA GT: GGT: 23 IU/L (ref 0–65)

## 2021-09-04 ENCOUNTER — Ambulatory Visit
Admission: RE | Admit: 2021-09-04 | Discharge: 2021-09-04 | Disposition: A | Discharge status: Home / Self Care | Payer: Medicare HMO | Source: Ambulatory Visit | Attending: Internal Medicine | Admitting: Internal Medicine

## 2021-09-04 DIAGNOSIS — R748 Abnormal levels of other serum enzymes: Secondary | ICD-10-CM | POA: Diagnosis not present

## 2021-09-04 DIAGNOSIS — K760 Fatty (change of) liver, not elsewhere classified: Secondary | ICD-10-CM | POA: Diagnosis not present

## 2021-10-02 ENCOUNTER — Other Ambulatory Visit: Payer: Self-pay | Admitting: Internal Medicine

## 2021-10-09 ENCOUNTER — Other Ambulatory Visit: Payer: Self-pay | Admitting: Internal Medicine

## 2021-10-15 DIAGNOSIS — N183 Chronic kidney disease, stage 3 unspecified: Secondary | ICD-10-CM | POA: Diagnosis not present

## 2021-10-15 DIAGNOSIS — Z6841 Body Mass Index (BMI) 40.0 and over, adult: Secondary | ICD-10-CM | POA: Diagnosis not present

## 2021-10-15 DIAGNOSIS — I509 Heart failure, unspecified: Secondary | ICD-10-CM | POA: Diagnosis not present

## 2021-10-15 DIAGNOSIS — N2581 Secondary hyperparathyroidism of renal origin: Secondary | ICD-10-CM | POA: Diagnosis not present

## 2021-10-15 DIAGNOSIS — I129 Hypertensive chronic kidney disease with stage 1 through stage 4 chronic kidney disease, or unspecified chronic kidney disease: Secondary | ICD-10-CM | POA: Diagnosis not present

## 2021-12-16 ENCOUNTER — Encounter: Payer: Self-pay | Admitting: Internal Medicine

## 2021-12-16 ENCOUNTER — Ambulatory Visit (INDEPENDENT_AMBULATORY_CARE_PROVIDER_SITE_OTHER): Payer: Medicare HMO | Admitting: Internal Medicine

## 2021-12-16 VITALS — BP 118/60 | HR 90 | Temp 98.8°F | Ht 66.6 in | Wt 265.8 lb

## 2021-12-16 DIAGNOSIS — N1831 Chronic kidney disease, stage 3a: Secondary | ICD-10-CM | POA: Diagnosis not present

## 2021-12-16 DIAGNOSIS — Z6841 Body Mass Index (BMI) 40.0 and over, adult: Secondary | ICD-10-CM

## 2021-12-16 DIAGNOSIS — I130 Hypertensive heart and chronic kidney disease with heart failure and stage 1 through stage 4 chronic kidney disease, or unspecified chronic kidney disease: Secondary | ICD-10-CM | POA: Diagnosis not present

## 2021-12-16 DIAGNOSIS — E7800 Pure hypercholesterolemia, unspecified: Secondary | ICD-10-CM

## 2021-12-16 DIAGNOSIS — I442 Atrioventricular block, complete: Secondary | ICD-10-CM

## 2021-12-16 DIAGNOSIS — E66813 Obesity, class 3: Secondary | ICD-10-CM

## 2021-12-16 NOTE — Progress Notes (Signed)
?[employer] as a Education administrator for [name], MD.,have documented all relevant documentation on the behalf of [name], MD,as directed by  [name], MD while in the presence of [name], MD.  ?This visit occurred during the SARS-CoV-2 public health emergency.  Safety protocols were in place, including screening questions prior to the visit, additional usage of staff PPE, and extensive cleaning of exam room while observing appropriate contact time as indicated for disinfecting solutions. ? ?Subjective:  ?  ? Patient ID: [name] , male    DOB: 1946-03-19 , 76 y.o.   MRN: [id number] ? ? ?Chief Complaint  ?Patient presents with  ? Hypertension  ? ? ?HPI ? ?Patient presents today for a bp check.  He reports compliance with meds. He denies headaches, chest pain and shortness of breath.  ? ?Hypertension ?This is a chronic problem. The current episode started more than 1 year ago. The problem has been gradually improving since onset. The problem is controlled. Pertinent negatives include no blurred vision, chest pain, headaches or shortness of breath. Risk factors for coronary artery disease include dyslipidemia, male gender and obesity. Compliance problems include diet.  Identifiable causes of hypertension include chronic renal disease.   ? ?Past Medical History:  ?Diagnosis Date  ? Chronic renal insufficiency   ? WITH BASELINE CREATININE OF 1.6  ? Chronic systolic CHF (congestive heart failure) ([place])   ? Complete heart block ([facility])   ? s/p PPM by Dr Verlon Setting  ? Coronary disease   ? NONOBSTRUCTIVE  ? GERD (gastroesophageal reflux disease)   ? Gout   ? Hyperkalemia   ? Hypertension   ? Nonischemic cardiomyopathy ([place])   ? Presence of permanent cardiac pacemaker   ?  ? ?Family History  ?Problem Relation Age of Onset  ? Diabetes Father   ? ? ? ?Current Outpatient Medications:  ?  allopurinol (ZYLOPRIM) 100 MG tablet, TAKE 1 TABLET BY MOUTH EVERY DAY, Disp: 90 tablet, Rfl: 1 ?  Ascorbic  Acid (VITAMIN C) 1000 MG tablet, Take 2,000 mg by mouth daily. , Disp: , Rfl:  ?  aspirin EC 81 MG tablet, Take 81 mg by mouth daily., Disp: , Rfl:  ?  carvedilol (COREG) 12.5 MG tablet, Take 1 tablet (12.5 mg total) by mouth 2 (two) times daily., Disp: 180 tablet, Rfl: 3 ?  Cholecalciferol (VITAMIN D-3) 1000 UNITS CAPS, Take 2,000 Units by mouth daily. , Disp: , Rfl:  ?  Fexofenadine HCl (ALLEGRA PO), Take 1 tablet by mouth daily as needed (itching)., Disp: , Rfl:  ?  fluticasone-salmeterol (ADVAIR DISKUS) 250-50 MCG/ACT AEPB, Inhale 1 puff twice a day in the morning and evening 12 hours apart, Disp: 180 each, Rfl: 1 ?  furosemide (LASIX) 40 MG tablet, TAKE 1 TABLET BY MOUTH EVERY DAY, Disp: 90 tablet, Rfl: 1 ?  Garlic 1245 MG CAPS, Take 3,000 mg by mouth daily., Disp: , Rfl:  ?  hydrALAZINE (APRESOLINE) 25 MG tablet, Take 1 tablet (25 mg total) by mouth 3 (three) times daily., Disp: 270 tablet, Rfl: 3 ?  Colchicine 0.6 MG CAPS, Take 0.6 mg by mouth daily as needed (gout).  (Patient not taking: Reported on 02/11/2021), Disp: , Rfl:   ? ?Allergies  ?Allergen Reactions  ? Avalide [Irbesartan-Hydrochlorothiazide] Anaphylaxis  ? Penicillins Anaphylaxis  ?  Did it involve swelling of the face/tongue/throat, SOB, or low BP? Yes ?Did it involve sudden or severe rash/hives, skin peeling, or any reaction on the inside  of your mouth or nose? Yes ?Did you need to seek medical attention at a hospital or doctor's office? Reaction occurred at MD office - penicillin injection ?When did it last happen?      young adult ?If all above answers are ?NO?, may proceed with cephalosporin use.  ? Tinactin [Tolnaftate] Hives and Rash  ?   ?  ?  ? ?Review of Systems  ?Constitutional: Negative.   ?Eyes:  Negative for blurred vision.  ?Respiratory: Negative.  Negative for shortness of breath.   ?Cardiovascular: Negative.  Negative for chest pain.  ?Gastrointestinal: Negative.   ?Neurological: Negative.  Negative for headaches.   ?Psychiatric/Behavioral: Negative.     ? ?Today's Vitals  ? 12/16/21 1021  ?BP: 118/60  ?Pulse: 90  ?Temp: 98.8 ?F (37.1 ?C)  ?Weight: 265 lb 12.8 oz (120.6 kg)  ?Height: 5' 6.6" (1.692 m)  ?PainSc: 0-No pain  ? ?Body mass index is 42.13 kg/m?.  ?Wt Readings from Last 3 Encounters:  ?12/16/21 265 lb 12.8 oz (120.6 kg)  ?08/20/21 270 lb 6.4 oz (122.7 kg)  ?07/04/21 258 lb (117 kg)  ?  ?BP Readings from Last 3 Encounters:  ?12/16/21 118/60  ?08/20/21 122/80  ?02/11/21 120/78  ?  ?Objective:  ?Physical Exam ?Vitals and nursing note reviewed.  ?Constitutional:   ?   Appearance: Normal appearance. He is obese.  ?HENT:  ?   Head: Normocephalic and atraumatic.  ?Eyes:  ?   Extraocular Movements: Extraocular movements intact.  ?Cardiovascular:  ?   Rate and Rhythm: Normal rate and regular rhythm.  ?   Heart sounds: Normal heart sounds.  ?Pulmonary:  ?   Effort: Pulmonary effort is normal.  ?   Breath sounds: Normal breath sounds.  ?Abdominal:  ?   Comments: 0bese  ?Musculoskeletal:  ?   Cervical back: Normal range of motion.  ?Skin: ?   General: Skin is warm.  ?Neurological:  ?   General: No focal deficit present.  ?   Mental Status: He is alert.  ?Psychiatric:     ?   Mood and Affect: Mood normal.  ?   ?Assessment And Plan:  ?   ?1. 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 No med changes.  He is encouraged to follow low sodium diet. He will rto in June 2023 for his next physical exam. ?- CMP14+EGFR ?- Lipid panel ? ?2. Complete heart block ([place]) ?Comments: Chronic, s/p pacemaker.  ? ?3. Stage 3a chronic kidney disease ([place]) ?Comments: Chronic, his nephrologist has retired. He will remain established w/ [facility].  ?- Protein electrophoresis, serum ?- Phosphorus ?- Parathyroid Hormone, Intact w/Ca ? ?4. Pure hypercholesterolemia ?Comments: Last LDL, 139 - he has declined statins in the past. I will recheck lipids today, ASVCD risk si 30%. He now  agrees to consider statin use.  ?- Lipid panel ? ?5. Class 3 severe obesity due to excess calories with serious comorbidity and body mass index (BMI) of 40.0 to 44.9 in adult [facility]) ?He was congratulated for losing five pounds.  He is encouraged to initially strive for BMI less than 30 to decrease cardiac risk. He is advised to exercise no less than 150 minutes per week.   ? ?Patient was given opportunity to ask questions. Patient verbalized understanding of the plan and was able to repeat key elements of the plan. All questions were answered to their satisfaction.  ? ?I, [name], MD, have reviewed all documentation  for this visit. The documentation on 12/16/21 for the exam, diagnosis, procedures, and orders are all accurate and complete.  ? ?IF YOU HAVE BEEN REFERRED TO A SPECIALIST, IT MAY TAKE 1-2 WEEKS TO SCHEDULE/PROCESS THE REFERRAL. IF YOU HAVE NOT HEARD FROM US/SPECIALIST IN TWO WEEKS, PLEASE GIVE [place] A CALL AT [phone].  ? ?THE PATIENT IS ENCOURAGED TO PRACTICE SOCIAL DISTANCING DUE TO THE COVID-19 PANDEMIC.   ?

## 2021-12-16 NOTE — Patient Instructions (Addendum)
The 10-year ASCVD risk score (Arnett DK, et al., 2019) is: 30.9%* ?  Values used to calculate the score: ?    Age: 76 years ?    Sex: Male ?    Is Non-Hispanic African American: Yes ?    Diabetic: No ?    Tobacco smoker: Yes ?    Systolic Blood Pressure: 118 mmHg ?    Is BP treated: Yes ?    HDL Cholesterol: 58 mg/dL* ?    Total Cholesterol: 186 mg/dL* ?    * - Cholesterol units were assumed for this score calculation ? ? ?Cooking With Less Salt ?Cooking with less salt is one way to reduce the amount of sodium you get from food. Sodium is one of the elements that make up salt. It is found naturally in foods and is also added to certain foods. Depending on your condition and overall health, your health care provider or dietitian may recommend that you reduce your sodium intake. Most people should have less than 2,300 milligrams (mg) of sodium each day. If you have high blood pressure (hypertension), you may need to limit your sodium to 1,500 mg each day. Follow the tips below to help reduce your sodium intake. ?What are tips for eating less sodium? ?Reading food labels ? ?Check the food label before buying or using packaged ingredients. Always check the label for the serving size and sodium content. ?Look for products with no more than 140 mg of sodium in one serving. ?Check the % Daily Value column to see what percent of the daily recommended amount of sodium is provided in one serving of the product. Foods with 5% or less in this column are considered low in sodium. Foods with 20% or higher are considered high in sodium. ?Do not choose foods with salt as one of the first three ingredients on the ingredients list. If salt is one of the first three ingredients, it usually means the item is high in sodium. ?Shopping ?Buy sodium-free or low-sodium products. Look for the following words on food labels: ?Low-sodium. ?Sodium-free. ?Reduced-sodium. ?No salt added. ?Unsalted. ?Always check the sodium content even if foods  are labeled as low-sodium or no salt added. ?Buy fresh foods. ?Cooking ?Use herbs, seasonings without salt, and spices as substitutes for salt. ?Use sodium-free baking soda when baking. ?Grill, braise, or roast foods to add flavor with less salt. ?Avoid adding salt to pasta, rice, or hot cereals. ?Drain and rinse canned vegetables, beans, and meat before use. ?Avoid adding salt when cooking sweets and desserts. ?Cook with low-sodium ingredients. ?What foods are high in sodium? ?Vegetables ?Regular canned vegetables (not low-sodium or reduced-sodium). Sauerkraut, pickled vegetables, and relishes. Olives. [place] fries. Onion rings. Regular canned tomato sauce and paste. Regular tomato and vegetable juice. Frozen vegetables in sauces. ?Grains ?Instant hot cereals. Bread stuffing, pancake, and biscuit mixes. Croutons. Seasoned rice or pasta mixes. Noodle soup cups. Boxed or frozen macaroni and cheese. Regular salted crackers. Self-rising flour. Rolls. Bagels. Flour tortillas and wraps. ?Meats and other proteins ?Meat or fish that is salted, canned, smoked, cured, spiced, or pickled. This includes bacon, ham, sausages, hot dogs, corned beef, chipped beef, meat loaves, salt pork, jerky, pickled herring, anchovies, regular canned tuna, and sardines. Salted nuts. ?Dairy ?Processed cheese and cheese spreads. Cheese curds. Blue cheese. Feta cheese. String cheese. Regular cottage cheese. Buttermilk. Canned milk. ?The items listed above may not be a complete list of foods high in sodium. Actual amounts of sodium may be different  depending on processing. Contact a dietitian for more information. ?What foods are low in sodium? ?Fruits ?Fresh, frozen, or canned fruit with no sauce added. Fruit juice. ?Vegetables ?Fresh or frozen vegetables with no sauce added. "No salt added" canned vegetables. "No salt added" tomato sauce and paste. Low-sodium or reduced-sodium tomato and vegetable juice. ?Grains ?Noodles, pasta, quinoa, rice.  Shredded or puffed wheat or puffed rice. Regular or quick oats (not instant). Low-sodium crackers. Low-sodium bread. Whole-grain bread and whole-grain pasta. Unsalted popcorn. ?Meats and other proteins ?Fresh or frozen whole meats, poultry (not injected with sodium), and fish with no sauce added. Unsalted nuts. Dried peas, beans, and lentils without added salt. Unsalted canned beans. Eggs. Unsalted nut butters. Low-sodium canned tuna or chicken. ?Dairy ?Milk. Soy milk. Yogurt. Low-sodium cheeses, such as Swiss, [address], [place], and [employer]. Sherbet or ice cream (keep to ? cup per serving). Cream cheese. ?Fats and oils ?Unsalted butter or margarine. ?Other foods ?Homemade pudding. Sodium-free baking soda and baking powder. Herbs and spices. Low-sodium seasoning mixes. ?Beverages ?Coffee and tea. Carbonated beverages. ?The items listed above may not be a complete list of foods low in sodium. Actual amounts of sodium may be different depending on processing. Contact a dietitian for more information. ?What are some salt alternatives when cooking? ?The following are herbs, seasonings, and spices that can be used instead of salt to flavor your food. Herbs should be fresh or dried. Do not choose packaged mixes. Next to the name of the herb, spice, or seasoning are some examples of foods you can pair it with. ?Herbs ?Bay leaves - Soups, meat and vegetable dishes, and spaghetti sauce. ?Basil - [employer], soups, pasta, and fish dishes. ?Cilantro - Meat, poultry, and vegetable dishes. ?Chili powder - Marinades and Mexican dishes. ?Chives - Salad dressings and potato dishes. ?Cumin - Mexican dishes, couscous, and meat dishes. ?Dill - Fish dishes, sauces, and salads. ?Fennel - Meat and vegetable dishes, breads, and cookies. ?Garlic (do not use garlic salt) - [place] dishes, meat dishes, salad dressings, and sauces. ?Marjoram - Soups, potato dishes, and meat dishes. ?Oregano - Pizza and spaghetti sauce. ?Parsley -  Salads, soups, pasta, and meat dishes. ?Rosemary - [place] dishes, salad dressings, soups, and red meats. ?Saffron - Fish dishes, pasta, and some poultry dishes. ?Sage - Stuffings and sauces. ?Tarragon - Fish and poultry dishes. ?Thyme - Stuffing, meat, and fish dishes. ?Seasonings ?Lemon juice - Fish dishes, poultry dishes, vegetables, and salads. ?Vinegar - Salad dressings, vegetables, and fish dishes. ?Spices ?Cinnamon - Sweet dishes, such as cakes, cookies, and puddings. ?Cloves - Gingerbread, puddings, and marinades for meats. ?Curry - Vegetable dishes, fish and poultry dishes, and stir-fry dishes. ?Ginger - Vegetable dishes, fish dishes, and stir-fry dishes. ?Nutmeg - Pasta, vegetables, poultry, fish dishes, and custard. ?Summary ?Cooking with less salt is one way to reduce the amount of sodium that you get from food. ?Buy sodium-free or low-sodium products. ?Check the food label before using or buying packaged ingredients. ?Use herbs, seasonings without salt, and spices as substitutes for salt in foods. ?This information is not intended to replace advice given to you by your health care provider. Make sure you discuss any questions you have with your health care provider. ?Document Revised: 08/17/2019 Document Reviewed: 08/17/2019 ?Elsevier Patient Education ? 2022 Elsevier Inc. ? ?

## 2021-12-17 LAB — LIPID PANEL
Chol/HDL Ratio: 3.3 ratio (ref 0.0–5.0)
Cholesterol, Total: 197 mg/dL (ref 100–199)
HDL: 60 mg/dL
LDL Chol Calc (NIH): 123 mg/dL — ABNORMAL HIGH (ref 0–99)
Triglycerides: 75 mg/dL (ref 0–149)
VLDL Cholesterol Cal: 14 mg/dL (ref 5–40)

## 2021-12-17 LAB — CMP14+EGFR
ALT: 17 [IU]/L (ref 0–44)
AST: 17 [IU]/L (ref 0–40)
Albumin/Globulin Ratio: 1.2 (ref 1.2–2.2)
Albumin: 3.9 g/dL (ref 3.7–4.7)
Alkaline Phosphatase: 332 [IU]/L — ABNORMAL HIGH (ref 44–121)
BUN/Creatinine Ratio: 16 (ref 10–24)
BUN: 23 mg/dL (ref 8–27)
Bilirubin Total: 0.9 mg/dL (ref 0.0–1.2)
CO2: 24 mmol/L (ref 20–29)
Calcium: 9.1 mg/dL (ref 8.6–10.2)
Chloride: 104 mmol/L (ref 96–106)
Creatinine, Ser: 1.46 mg/dL — ABNORMAL HIGH (ref 0.76–1.27)
Globulin, Total: 3.3 g/dL (ref 1.5–4.5)
Glucose: 116 mg/dL — ABNORMAL HIGH (ref 70–99)
Potassium: 4.6 mmol/L (ref 3.5–5.2)
Sodium: 140 mmol/L (ref 134–144)
Total Protein: 7.2 g/dL (ref 6.0–8.5)
eGFR: 49 mL/min/{1.73_m2} — ABNORMAL LOW

## 2021-12-17 LAB — PROTEIN ELECTROPHORESIS, SERUM
A/G Ratio: 0.8 (ref 0.7–1.7)
Albumin ELP: 3.2 g/dL (ref 2.9–4.4)
Alpha 1: 0.2 g/dL (ref 0.0–0.4)
Alpha 2: 0.6 g/dL (ref 0.4–1.0)
Beta: 1.1 g/dL (ref 0.7–1.3)
Gamma Globulin: 2.1 g/dL — ABNORMAL HIGH (ref 0.4–1.8)
Globulin, Total: 4 g/dL — ABNORMAL HIGH (ref 2.2–3.9)

## 2021-12-17 LAB — PHOSPHORUS: Phosphorus: 3.1 mg/dL (ref 2.8–4.1)

## 2021-12-17 LAB — PTH, INTACT AND CALCIUM: PTH: 55 pg/mL (ref 15–65)

## 2021-12-20 ENCOUNTER — Other Ambulatory Visit: Payer: Self-pay

## 2021-12-20 LAB — GAMMA GT: GGT: 19 IU/L (ref 0–65)

## 2021-12-20 LAB — SPECIMEN STATUS REPORT

## 2021-12-20 MED ORDER — ATORVASTATIN CALCIUM 40 MG PO TABS: 40.0000 mg | ORAL_TABLET | Freq: Every day | ORAL | 1 refills | Status: DC

## 2022-02-20 ENCOUNTER — Ambulatory Visit (INDEPENDENT_AMBULATORY_CARE_PROVIDER_SITE_OTHER): Payer: Medicare HMO | Admitting: Internal Medicine

## 2022-02-20 ENCOUNTER — Encounter: Payer: Self-pay | Admitting: Internal Medicine

## 2022-02-20 VITALS — BP 144/82 | HR 86 | Temp 97.9°F | Ht 66.6 in | Wt 267.0 lb

## 2022-02-20 DIAGNOSIS — Z6841 Body Mass Index (BMI) 40.0 and over, adult: Secondary | ICD-10-CM

## 2022-02-20 DIAGNOSIS — E7800 Pure hypercholesterolemia, unspecified: Secondary | ICD-10-CM | POA: Diagnosis not present

## 2022-02-20 DIAGNOSIS — R7309 Other abnormal glucose: Secondary | ICD-10-CM

## 2022-02-20 DIAGNOSIS — Z0000 Encounter for general adult medical examination without abnormal findings: Secondary | ICD-10-CM | POA: Diagnosis not present

## 2022-02-20 DIAGNOSIS — I130 Hypertensive heart and chronic kidney disease with heart failure and stage 1 through stage 4 chronic kidney disease, or unspecified chronic kidney disease: Secondary | ICD-10-CM

## 2022-02-20 DIAGNOSIS — N1831 Chronic kidney disease, stage 3a: Secondary | ICD-10-CM

## 2022-02-20 LAB — POCT URINALYSIS DIPSTICK
Bilirubin, UA: NEGATIVE
Blood, UA: NEGATIVE
Glucose, UA: NEGATIVE
Ketones, UA: NEGATIVE
Nitrite, UA: NEGATIVE
Protein, UA: POSITIVE — AB
Spec Grav, UA: 1.02 (ref 1.010–1.025)
Urobilinogen, UA: 0.2 E.U./dL
pH, UA: 5 (ref 5.0–8.0)

## 2022-02-20 NOTE — Patient Instructions (Signed)
Health Maintenance, Male Adopting a healthy lifestyle and getting preventive care are important in promoting health and wellness. Ask your health care provider about: The right schedule for you to have regular tests and exams. Things you can do on your own to prevent diseases and keep yourself healthy. What should I know about diet, weight, and exercise? Eat a healthy diet  Eat a diet that includes plenty of vegetables, fruits, low-fat dairy products, and lean protein. Do not eat a lot of foods that are high in solid fats, added sugars, or sodium. Maintain a healthy weight Body mass index (BMI) is a measurement that can be used to identify possible weight problems. It estimates body fat based on height and weight. Your health care provider can help determine your BMI and help you achieve or maintain a healthy weight. Get regular exercise Get regular exercise. This is one of the most important things you can do for your health. Most adults should: Exercise for at least 150 minutes each week. The exercise should increase your heart rate and make you sweat (moderate-intensity exercise). Do strengthening exercises at least twice a week. This is in addition to the moderate-intensity exercise. Spend less time sitting. Even light physical activity can be beneficial. Watch cholesterol and blood lipids Have your blood tested for lipids and cholesterol at 78 years of age, then have this test every 5 years. You may need to have your cholesterol levels checked more often if: Your lipid or cholesterol levels are high. You are older than 78 years of age. You are at high risk for heart disease. What should I know about cancer screening? Many types of cancers can be detected early and may often be prevented. Depending on your health history and family history, you may need to have cancer screening at various ages. This may include screening for: Colorectal cancer. Prostate cancer. Skin cancer. Lung  cancer. What should I know about heart disease, diabetes, and high blood pressure? Blood pressure and heart disease High blood pressure causes heart disease and increases the risk of stroke. This is more likely to develop in people who have high blood pressure readings or are overweight. Talk with your health care provider about your target blood pressure readings. Have your blood pressure checked: Every 3-5 years if you are 18-39 years of age. Every year if you are 40 years old or older. If you are between the ages of 65 and 75 and are a current or former smoker, ask your health care provider if you should have a one-time screening for abdominal aortic aneurysm (AAA). Diabetes Have regular diabetes screenings. This checks your fasting blood sugar level. Have the screening done: Once every three years after age 45 if you are at a normal weight and have a low risk for diabetes. More often and at a younger age if you are overweight or have a high risk for diabetes. What should I know about preventing infection? Hepatitis B If you have a higher risk for hepatitis B, you should be screened for this virus. Talk with your health care provider to find out if you are at risk for hepatitis B infection. Hepatitis C Blood testing is recommended for: Everyone born from 1945 through 1965. Anyone with known risk factors for hepatitis C. Sexually transmitted infections (STIs) You should be screened each year for STIs, including gonorrhea and chlamydia, if: You are sexually active and are younger than 78 years of age. You are older than 78 years of age and your   health care provider tells you that you are at risk for this type of infection. Your sexual activity has changed since you were last screened, and you are at increased risk for chlamydia or gonorrhea. Ask your health care provider if you are at risk. Ask your health care provider about whether you are at high risk for HIV. Your health care provider  may recommend a prescription medicine to help prevent HIV infection. If you choose to take medicine to prevent HIV, you should first get tested for HIV. You should then be tested every 3 months for as long as you are taking the medicine. Follow these instructions at home: Alcohol use Do not drink alcohol if your health care provider tells you not to drink. If you drink alcohol: Limit how much you have to 0-2 drinks a day. Know how much alcohol is in your drink. In the [place] one drink equals one 12 oz bottle of beer (355 mL), one 5 oz glass of wine (148 mL), or one 1 oz glass of hard liquor (44 mL). Lifestyle Do not use any products that contain nicotine or tobacco. These products include cigarettes, chewing tobacco, and vaping devices, such as e-cigarettes. If you need help quitting, ask your health care provider. Do not use street drugs. Do not share needles. Ask your health care provider for help if you need support or information about quitting drugs. General instructions Schedule regular health, dental, and eye exams. Stay current with your vaccines. Tell your health care provider if: You often feel depressed. You have ever been abused or do not feel safe at home. Summary Adopting a healthy lifestyle and getting preventive care are important in promoting health and wellness. Follow your health care provider's instructions about healthy diet, exercising, and getting tested or screened for diseases. Follow your health care provider's instructions on monitoring your cholesterol and blood pressure. This information is not intended to replace advice given to you by your health care provider. Make sure you discuss any questions you have with your health care provider. Document Revised: 01/14/2021 Document Reviewed: 01/14/2021 Elsevier Patient Education  2023 Elsevier Inc.  

## 2022-02-20 NOTE — Progress Notes (Signed)
I,Tianna Badgett,acting as a Education administrator for [name], MD.,have documented all relevant documentation on the behalf of [name], MD,as directed by  [name], MD while in the presence of [name], MD.  This visit occurred during the SARS-CoV-2 public health emergency.  Safety protocols were in place, including screening questions prior to the visit, additional usage of staff PPE, and extensive cleaning of exam room while observing appropriate contact time as indicated for disinfecting solutions.  Subjective:     Patient ID: [name] , male    DOB: [birth year] , 76 y.o.   MRN: [id number]   Chief Complaint  Patient presents with   Annual Exam    HPI  He is here today for a full physical examination. He has no specific concerns or complaints at this time. He is also followed by [facility]. He reports compliance with meds. He denies headaches, chest pain and shortness of breath. He states he plans to travel internationally in the near future.   Hypertension This is a chronic problem. The current episode started more than 1 year ago. The problem has been gradually improving since onset. The problem is uncontrolled. Pertinent negatives include no blurred vision, chest pain, palpitations or shortness of breath. Risk factors for coronary artery disease include dyslipidemia, obesity and sedentary lifestyle. The current treatment provides moderate improvement. Compliance problems include exercise.  Hypertensive end-organ damage includes kidney disease.     Past Medical History:  Diagnosis Date   Chronic renal insufficiency    WITH BASELINE CREATININE OF 1.6   Chronic systolic CHF (congestive heart failure) (HCC)    Complete heart block (HCC)    s/p PPM by Dr Verlon Setting   Coronary disease    NONOBSTRUCTIVE   GERD (gastroesophageal reflux disease)    Gout    Hyperkalemia    Hypertension    Nonischemic cardiomyopathy (HCC)    Presence of permanent cardiac  pacemaker      Family History  Problem Relation Age of Onset   Diabetes Father      Current Outpatient Medications:    atorvastatin (LIPITOR) 40 MG tablet, Take 1 tablet (40 mg total) by mouth daily., Disp: 90 tablet, Rfl: 1   Colchicine 0.6 MG CAPS, Take 0.6 mg by mouth daily as needed (gout)., Disp: , Rfl:    allopurinol (ZYLOPRIM) 100 MG tablet, TAKE 1 TABLET BY MOUTH EVERY DAY, Disp: 90 tablet, Rfl: 1   Ascorbic Acid (VITAMIN C) 1000 MG tablet, Take 2,000 mg by mouth daily. , Disp: , Rfl:    aspirin EC 81 MG tablet, Take 81 mg by mouth daily., Disp: , Rfl:    carvedilol (COREG) 12.5 MG tablet, Take 1 tablet (12.5 mg total) by mouth 2 (two) times daily., Disp: 180 tablet, Rfl: 3   Cholecalciferol (VITAMIN D-3) 1000 UNITS CAPS, Take 2,000 Units by mouth daily. , Disp: , Rfl:    Fexofenadine HCl (ALLEGRA PO), Take 1 tablet by mouth daily as needed (itching)., Disp: , Rfl:    fluticasone-salmeterol (ADVAIR DISKUS) 250-50 MCG/ACT AEPB, Inhale 1 puff twice a day in the morning and evening 12 hours apart, Disp: 180 each, Rfl: 1   furosemide (LASIX) 40 MG tablet, TAKE 1 TABLET BY MOUTH EVERY DAY, Disp: 90 tablet, Rfl: 1   Garlic 2831 MG CAPS, Take 3,000 mg by mouth daily., Disp: , Rfl:    hydrALAZINE (APRESOLINE) 25 MG tablet, Take 1 tablet (25 mg total) by mouth 3 (three) times daily., Disp:  270 tablet, Rfl: 3   Allergies  Allergen Reactions   Avalide [Irbesartan-Hydrochlorothiazide] Anaphylaxis   Penicillins Anaphylaxis    Did it involve swelling of the face/tongue/throat, SOB, or low BP? Yes Did it involve sudden or severe rash/hives, skin peeling, or any reaction on the inside of your mouth or nose? Yes Did you need to seek medical attention at a hospital or doctor's office? Reaction occurred at MD office - penicillin injection When did it last happen?      young adult If all above answers are "NO", may proceed with cephalosporin use.   Tinactin [Tolnaftate] Hives and Rash           Men's preventive visit. Patient Health Questionnaire (PHQ-2) is  [place] Office Visit from 02/20/2022 in [facility]  PHQ-2 Total Score 3     . Patient is on a low sodium diet. Marital status: Divorced. Relevant history for alcohol use is:  Social History   Substance and Sexual Activity  Alcohol Use Yes   Alcohol/week: 7.0 standard drinks of alcohol   Types: 7 Shots of liquor per week  . Relevant history for tobacco use is:  Social History   Tobacco Use  Smoking Status Former   Packs/day: 1.00   Years: 15.00   Total pack years: 15.00   Types: Cigarettes  Smokeless Tobacco Never  Tobacco Comments   "quit smoking cigarettes in 1985"  .   Review of Systems  Constitutional: Negative.   HENT: Negative.    Eyes: Negative.  Negative for blurred vision.  Respiratory: Negative.  Negative for shortness of breath.   Cardiovascular: Negative.  Negative for chest pain and palpitations.  Gastrointestinal: Negative.   Endocrine: Negative.   Genitourinary: Negative.   Musculoskeletal: Negative.   Skin: Negative.   Allergic/Immunologic: Negative.   Neurological: Negative.   Hematological: Negative.   Psychiatric/Behavioral: Negative.       Today's Vitals   02/20/22 0848 02/20/22 0918  BP: (!) 150/92 (!) 144/82  Pulse: 86   Temp: 97.9 F (36.6 C)   TempSrc: Oral   Weight: 267 lb (121.1 kg)   Height: 5' 6.6" (1.692 m)    Body mass index is 42.32 kg/m.  Wt Readings from Last 3 Encounters:  02/20/22 267 lb (121.1 kg)  12/16/21 265 lb 12.8 oz (120.6 kg)  08/20/21 270 lb 6.4 oz (122.7 kg)    Objective:  Physical Exam Vitals and nursing note reviewed.  Constitutional:      Appearance: Normal appearance.  HENT:     Head: Normocephalic and atraumatic.     Right Ear: Tympanic membrane, ear canal and external ear normal.     Left Ear: Tympanic membrane, ear canal and external ear normal.     Nose: Nose normal.     Mouth/Throat:     Mouth:  Mucous membranes are moist.     Pharynx: Oropharynx is clear.  Eyes:     Extraocular Movements: Extraocular movements intact.     Conjunctiva/sclera: Conjunctivae normal.     Pupils: Pupils are equal, round, and reactive to light.  Cardiovascular:     Rate and Rhythm: Normal rate and regular rhythm.     Pulses: Normal pulses.     Heart sounds: Normal heart sounds.  Pulmonary:     Effort: Pulmonary effort is normal.     Breath sounds: Normal breath sounds.  Chest:  Breasts:    Right: Normal. No swelling, bleeding, inverted nipple, mass or nipple discharge.  Left: Normal. No swelling, bleeding, inverted nipple, mass or nipple discharge.  Abdominal:     General: Abdomen is flat. Bowel sounds are normal.     Palpations: Abdomen is soft.     Hernia: A hernia is present. Hernia is present in the umbilical area.  Genitourinary:    Comments: deferred Musculoskeletal:        General: Normal range of motion.     Cervical back: Normal range of motion and neck supple.     Right lower leg: Edema present.     Left lower leg: Edema present.  Skin:    General: Skin is warm.  Neurological:     General: No focal deficit present.     Mental Status: He is alert.  Psychiatric:        Mood and Affect: Mood normal.        Behavior: Behavior normal.      Assessment And Plan:    1. Routine general medical examination at health care facility Comments: A full exam was performed, DRE deferred, per his request. PATIENT IS ADVISED TO GET 30-45 MINUTES REGULAR EXERCISE NO LESS THAN FOUR TO FIVE DAYS PER WEEK - BOTH WEIGHTBEARING EXERCISES AND AEROBIC ARE RECOMMENDED.  PATIENT IS ADVISED TO FOLLOW A HEALTHY DIET WITH AT LEAST SIX FRUITS/VEGGIES PER DAY, DECREASE INTAKE OF RED MEAT, AND TO INCREASE FISH INTAKE TO TWO DAYS PER WEEK.  MEATS/FISH SHOULD NOT BE FRIED, BAKED OR BROILED IS PREFERABLE.  IT IS ALSO IMPORTANT TO CUT BACK ON YOUR SUGAR INTAKE. PLEASE AVOID ANYTHING WITH ADDED SUGAR, CORN SYRUP OR  OTHER SWEETENERS. IF YOU MUST USE A SWEETENER, YOU CAN TRY STEVIA. IT IS ALSO IMPORTANT TO AVOID ARTIFICIALLY SWEETENERS AND DIET BEVERAGES. LASTLY, I SUGGEST WEARING SPF 50 SUNSCREEN ON EXPOSED PARTS AND ESPECIALLY WHEN IN THE DIRECT SUNLIGHT FOR AN EXTENDED PERIOD OF TIME.  PLEASE AVOID FAST FOOD RESTAURANTS AND INCREASE YOUR WATER INTAKE.  2. 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 Repeat BP improved, but not at goal. Previous BP controlled. I will not change any meds at this time. EKG performed, pacemaker EKG. He is encouraged to follow low sodium diet, advised to avoid packaged foods - chips, crackers, etc. He will f/u in 4-6 months for re-evaluation.  - POCT Urinalysis Dipstick (81002) - Microalbumin / Creatinine Urine Ratio - EKG 12-Lead - CBC - Hemoglobin A1c - CMP14+EGFR - Lipid panel - VITAMIN D 25 Hydroxy (Vit-D Deficiency, Fractures)  3. Stage 3a chronic kidney disease ([place]) Comments: Chronic, Renal labs performed April 2023. I will fax results to [facility]. He is encouraged to avoid NSAIDs, stay well hydrated and keep BP under optimal control to decrease risk of CKD progression.  - CMP14+EGFR - VITAMIN D 25 Hydroxy (Vit-D Deficiency, Fractures)  4. Pure hypercholesterolemia Comments: Chronic, he was started on atorvastatin 13m at last visit. I will check lipid panel and LFTs today. LDL goal <70.  - CMP14+EGFR - Lipid panel  5. Other abnormal glucose Comments: His a1c has been elevated in the past. I will recheck this today. He is encouraged to decrease intake of sugary foods and beverages.  6. Class 3 severe obesity due to excess calories with serious comorbidity and body mass index (BMI) of 40.0 to 44.9 in adult ([place] Bmi 42. He is encouraged to initially strive for BMI less than 37 to decrease cardiac risk. He is advised to exercise no less than 150 minutes per week.   Patient  was given opportunity to  ask questions. Patient verbalized understanding of the plan and was able to repeat key elements of the plan. All questions were answered to their satisfaction.   I, [name], MD, have reviewed all documentation for this visit. The documentation on 02/20/22 for the exam, diagnosis, procedures, and orders are all accurate and complete.   THE PATIENT IS ENCOURAGED TO PRACTICE SOCIAL DISTANCING DUE TO THE COVID-19 PANDEMIC.

## 2022-02-21 LAB — CBC
Hematocrit: 40.5 % (ref 37.5–51.0)
Hemoglobin: 13 g/dL (ref 13.0–17.7)
MCH: 29.5 pg (ref 26.6–33.0)
MCHC: 32.1 g/dL (ref 31.5–35.7)
MCV: 92 fL (ref 79–97)
Platelets: 177 10*3/uL (ref 150–450)
RBC: 4.41 x10E6/uL (ref 4.14–5.80)
RDW: 13.8 % (ref 11.6–15.4)
WBC: 6 10*3/uL (ref 3.4–10.8)

## 2022-02-21 LAB — HEMOGLOBIN A1C
Est. average glucose Bld gHb Est-mCnc: 134 mg/dL
Hgb A1c MFr Bld: 6.3 % — ABNORMAL HIGH (ref 4.8–5.6)

## 2022-02-21 LAB — CMP14+EGFR
ALT: 20 IU/L (ref 0–44)
AST: 19 IU/L (ref 0–40)
Albumin/Globulin Ratio: 1.2 (ref 1.2–2.2)
Albumin: 4 g/dL (ref 3.7–4.7)
Alkaline Phosphatase: 329 IU/L — ABNORMAL HIGH (ref 44–121)
BUN/Creatinine Ratio: 20 (ref 10–24)
BUN: 30 mg/dL — ABNORMAL HIGH (ref 8–27)
Bilirubin Total: 0.8 mg/dL (ref 0.0–1.2)
CO2: 24 mmol/L (ref 20–29)
Calcium: 9 mg/dL (ref 8.6–10.2)
Chloride: 100 mmol/L (ref 96–106)
Creatinine, Ser: 1.51 mg/dL — ABNORMAL HIGH (ref 0.76–1.27)
Globulin, Total: 3.4 g/dL (ref 1.5–4.5)
Glucose: 106 mg/dL — ABNORMAL HIGH (ref 70–99)
Potassium: 4.6 mmol/L (ref 3.5–5.2)
Sodium: 138 mmol/L (ref 134–144)
Total Protein: 7.4 g/dL (ref 6.0–8.5)
eGFR: 47 mL/min/{1.73_m2} — ABNORMAL LOW (ref >59)

## 2022-02-21 LAB — LIPID PANEL
Chol/HDL Ratio: 3.5 ratio (ref 0.0–5.0)
Cholesterol, Total: 195 mg/dL (ref 100–199)
HDL: 56 mg/dL (ref >39)
LDL Chol Calc (NIH): 123 mg/dL — ABNORMAL HIGH (ref 0–99)
Triglycerides: 87 mg/dL (ref 0–149)
VLDL Cholesterol Cal: 16 mg/dL (ref 5–40)

## 2022-02-21 LAB — MICROALBUMIN / CREATININE URINE RATIO
Creatinine, Urine: 152.5 mg/dL
Microalb/Creat Ratio: 63 mg/g creat — ABNORMAL HIGH (ref 0–29)
Microalbumin, Urine: 96.2 ug/mL

## 2022-02-21 LAB — VITAMIN D 25 HYDROXY (VIT D DEFICIENCY, FRACTURES): Vit D, 25-Hydroxy: 27.4 ng/mL — ABNORMAL LOW (ref 30.0–100.0)

## 2022-03-26 ENCOUNTER — Other Ambulatory Visit: Payer: Self-pay | Admitting: Internal Medicine

## 2022-04-11 ENCOUNTER — Telehealth: Payer: Self-pay | Admitting: Cardiology

## 2022-04-11 NOTE — Telephone Encounter (Signed)
error 

## 2022-04-12 ENCOUNTER — Other Ambulatory Visit: Payer: Self-pay | Admitting: Internal Medicine

## 2022-05-08 ENCOUNTER — Encounter: Payer: Self-pay | Admitting: Internal Medicine

## 2022-07-09 ENCOUNTER — Ambulatory Visit (INDEPENDENT_AMBULATORY_CARE_PROVIDER_SITE_OTHER): Payer: Medicare HMO

## 2022-07-09 VITALS — Ht 69.0 in | Wt 260.0 lb

## 2022-07-09 DIAGNOSIS — Z0000 Encounter for general adult medical examination without abnormal findings: Secondary | ICD-10-CM

## 2022-07-09 NOTE — Progress Notes (Signed)
I connected with [name] today by telephone and verified that I am speaking with the correct person using two identifiers. Location patient: home Location provider: work Persons participating in the virtual visit: [name], Gregory Ponder LPN.   I discussed the limitations, risks, security and privacy concerns of performing an evaluation and management service by telephone and the availability of in person appointments. I also discussed with the patient that there may be a patient responsible charge related to this service. The patient expressed understanding and verbally consented to this telephonic visit.    Interactive audio and video telecommunications were attempted between this provider and patient, however failed, due to patient having technical difficulties OR patient did not have access to video capability.  We continued and completed visit with audio only.     Vital signs may be patient reported or missing.  Subjective:   GIOVONNI POIRIER is a 76 y.o. male who presents for Medicare Annual/Subsequent preventive examination.  Review of Systems     Cardiac Risk Factors include: advanced age (>85men, >68 women);hypertension;male gender;obesity (BMI >30kg/m2)     Objective:    Today's Vitals   07/09/22 1156  Weight: 260 lb (117.9 kg)  Height: 5\' 9"  (1.753 m)   Body mass index is 38.4 kg/m.     07/09/2022   12:00 PM 07/04/2021    9:01 AM 06/28/2020    9:56 AM 09/21/2019   12:00 PM 08/17/2019    9:48 AM 07/10/2017    3:23 PM 02/14/2015    4:57 PM  Advanced Directives  Does Patient Have a Medical Advance Directive? No No No Yes No No No  Type of Advance Directive    Living will;Healthcare Power of Attorney     Copy of Healthcare Power of Attorney in Chart?    No - copy requested     Would patient like information on creating a medical advance directive? No - Patient declined    No - Patient declined No - Patient declined No - patient declined information     Current Medications (verified) Outpatient Encounter Medications as of 07/09/2022  Medication Sig   allopurinol (ZYLOPRIM) 100 MG tablet TAKE 1 TABLET BY MOUTH EVERY DAY   Ascorbic Acid (VITAMIN C) 1000 MG tablet Take 2,000 mg by mouth daily.    aspirin EC 81 MG tablet Take 81 mg by mouth daily.   atorvastatin (LIPITOR) 40 MG tablet Take 1 tablet (40 mg total) by mouth daily.   Cholecalciferol (VITAMIN D-3) 1000 UNITS CAPS Take 2,000 Units by mouth daily.    Colchicine 0.6 MG CAPS Take 0.6 mg by mouth daily as needed (gout).   Fexofenadine HCl (ALLEGRA PO) Take 1 tablet by mouth daily as needed (itching).   fluticasone-salmeterol (ADVAIR DISKUS) 250-50 MCG/ACT AEPB Inhale 1 puff twice a day in the morning and evening 12 hours apart   furosemide (LASIX) 40 MG tablet TAKE 1 TABLET BY MOUTH EVERY DAY   Garlic 1000 MG CAPS Take 3,000 mg by mouth daily.   carvedilol (COREG) 12.5 MG tablet Take 1 tablet (12.5 mg total) by mouth 2 (two) times daily.   hydrALAZINE (APRESOLINE) 25 MG tablet Take 1 tablet (25 mg total) by mouth 3 (three) times daily.   No facility-administered encounter medications on file as of 07/09/2022.    Allergies (verified) Avalide [irbesartan-hydrochlorothiazide], Penicillins, and Tinactin [tolnaftate]   History: Past Medical History:  Diagnosis Date   Chronic renal insufficiency    WITH BASELINE CREATININE OF 1.6  Chronic systolic CHF (congestive heart failure) (HCC)    Complete heart block (HCC)    s/p PPM by Dr Verlon Setting   Coronary disease    NONOBSTRUCTIVE   GERD (gastroesophageal reflux disease)    Gout    Hyperkalemia    Hypertension    Nonischemic cardiomyopathy ([place])    Presence of permanent cardiac pacemaker    Past Surgical History:  Procedure Laterality Date   CARDIAC CATHETERIZATION  08/2013   EP IMPLANTABLE DEVICE N/A 02/15/2015   Procedure: Lead Revision/Repair;  Surgeon: Thompson Grayer, MD;  Location: [facility];  Service: Cardiovascular;   Laterality: N/A;   INGUINAL HERNIA REPAIR Bilateral    INSERT / REPLACE / REMOVE PACEMAKER  05/20/2009   MDT by Dr Verlon Setting for complete heart block   LEFT AND RIGHT HEART CATHETERIZATION WITH CORONARY ANGIOGRAM N/A 08/16/2013   Procedure: LEFT AND RIGHT HEART CATHETERIZATION WITH CORONARY ANGIOGRAM;  Surgeon: [name] Martinique, MD;  Location: [facility] CATH LAB;  Service: Cardiovascular;  Laterality: N/A;   Family History  Problem Relation Age of Onset   Diabetes Father    Social History   Socioeconomic History   Marital status: Divorced    Spouse name: Not on file   Number of children: 1   Years of education: 14   Highest education level: Not on file  Occupational History   Occupation: Music therapist: Regulatory affairs officer   Occupation: retired  Tobacco Use   Smoking status: Former    Packs/day: 1.00    Years: 15.00    Total pack years: 15.00    Types: Cigarettes   Smokeless tobacco: Never   Tobacco comments:    "quit smoking cigarettes in 1985"  Vaping Use   Vaping Use: Never used  Substance and Sexual Activity   Alcohol use: Yes    Alcohol/week: 7.0 standard drinks of alcohol    Types: 7 Shots of liquor per week   Drug use: No   Sexual activity: Not Currently  Other Topics Concern   Not on file  Social History Narrative   Lives in [place].   Drinks a couple of coffees a day   Lives at home alone with his dog   Social Determinants of Health   Financial Resource Strain: Low Risk  (07/09/2022)   Overall Financial Resource Strain (CARDIA)    Difficulty of Paying Living Expenses: Not hard at all  Food Insecurity: No Food Insecurity (07/09/2022)   Hunger Vital Sign    Worried About Running Out of Food in the Last Year: Never true    Ran Out of Food in the Last Year: Never true  Transportation Needs: No Transportation Needs (07/09/2022)   PRAPARE - Hydrologist (Medical): No    Lack of Transportation (Non-Medical): No  Physical Activity:  Sufficiently Active (07/09/2022)   Exercise Vital Sign    Days of Exercise per Week: 5 days    Minutes of Exercise per Session: 120 min  Stress: No Stress Concern Present (07/09/2022)   [place]    Feeling of Stress : Not at all  Social Connections: Not on file    Tobacco Counseling Counseling given: Not Answered Tobacco comments: "quit smoking cigarettes in 1985"   Clinical Intake:  Pre-visit preparation completed: Yes  Pain : No/denies pain     Nutritional Status: BMI > 30  Obese Nutritional Risks: None Diabetes: No  How often do you need  to have someone help you when you read instructions, pamphlets, or other written materials from your doctor or pharmacy?: 1 - Never  Diabetic? no  Interpreter Needed?: No  Information entered by :: NAllen LPN   Activities of Daily Living    07/09/2022   12:01 PM  In your present state of health, do you have any difficulty performing the following activities:  Hearing? 0  Comment has hearing aids  Vision? 0  Difficulty concentrating or making decisions? 0  Walking or climbing stairs? 0  Dressing or bathing? 0  Doing errands, shopping? 0  Preparing Food and eating ? N  Using the Toilet? N  In the past six months, have you accidently leaked urine? N  Do you have problems with loss of bowel control? N  Managing your Medications? N  Managing your Finances? N  Housekeeping or managing your Housekeeping? N    Patient Care Team: Dorothyann Peng, MD as PCP - General (Internal Medicine)  Indicate any recent Medical Services you may have received from other than [facility] providers in the past year (date may be approximate).     Assessment:   This is a routine wellness examination for Brand.  Hearing/Vision screen Vision Screening - Comments:: Regular eye exams,   Dietary issues and exercise activities discussed: Current Exercise Habits: Home exercise routine,  Type of exercise: strength training/weights, Time (Minutes): > 60, Frequency (Times/Week): 5, Weekly Exercise (Minutes/Week): 0   Goals Addressed             This Visit's Progress    Patient Stated       07/09/2022, wants to lose weight       Depression Screen    07/09/2022   12:01 PM 02/20/2022    8:39 AM 07/04/2021    9:01 AM 06/28/2020    9:57 AM 08/17/2019    9:50 AM 02/23/2019    9:36 AM 08/10/2018    2:12 PM  PHQ 2/9 Scores  PHQ - 2 Score 0 3 0 0 0 3 0  PHQ- 9 Score  4   0 6     Fall Risk    07/09/2022   12:00 PM 02/20/2022    8:38 AM 07/04/2021    9:01 AM 06/28/2020    9:57 AM 08/17/2019    9:49 AM  Fall Risk   Falls in the past year? 0 0 0 0 0  Number falls in past yr: 0 0     Injury with Fall? 0 0     Risk for fall due to : Mental status change No Fall Risks Medication side effect Medication side effect Medication side effect  Follow up Falls prevention discussed;Education provided;Falls evaluation completed Falls evaluation completed Falls evaluation completed;Education provided;Falls prevention discussed Falls evaluation completed;Education provided;Falls prevention discussed Falls evaluation completed;Education provided;Falls prevention discussed    FALL RISK PREVENTION PERTAINING TO THE HOME:  Any stairs in or around the home? No  If so, are there any without handrails? N/a Home free of loose throw rugs in walkways, pet beds, electrical cords, etc? Yes  Adequate lighting in your home to reduce risk of falls? Yes   ASSISTIVE DEVICES UTILIZED TO PREVENT FALLS:  Life alert? No  Use of a cane, walker or w/c? No  Grab bars in the bathroom? No  Shower chair or bench in shower? No  Elevated toilet seat or a handicapped toilet? No   TIMED UP AND GO:  Was the test performed? No .  Cognitive Function:        07/09/2022   12:02 PM 06/28/2020    9:59 AM 08/17/2019    9:54 AM  6CIT Screen  What Year? 0 points 0 points 0 points  What month? 0  points 0 points 0 points  What time? 0 points 0 points 0 points  Count back from 20 2 points 0 points 0 points  Months in reverse 2 points 4 points 4 points  Repeat phrase 0 points 0 points 0 points  Total Score 4 points 4 points 4 points    Immunizations Immunization History  Administered Date(s) Administered   Moderna SARS-COV2 Booster Vaccination 04/02/2021   Moderna Sars-Covid-2 Vaccination 11/14/2019, 12/13/2019, 07/23/2020   Pneumococcal Polysaccharide-23 02/11/2021   Zoster Recombinat (Shingrix) 09/17/2021    TDAP status: Due, Education has been provided regarding the importance of this vaccine. Advised may receive this vaccine at local pharmacy or Health Dept. Aware to provide a copy of the vaccination record if obtained from local pharmacy or Health Dept. Verbalized acceptance and understanding.  Flu Vaccine status: Declined, Education has been provided regarding the importance of this vaccine but patient still declined. Advised may receive this vaccine at local pharmacy or Health Dept. Aware to provide a copy of the vaccination record if obtained from local pharmacy or Health Dept. Verbalized acceptance and understanding.  Pneumococcal vaccine status: Up to date  Covid-19 vaccine status: Completed vaccines  Qualifies for Shingles Vaccine? Yes   Zostavax completed No   Shingrix Completed?: Yes  Screening Tests Health Maintenance  Topic Date Due   TETANUS/TDAP  Never done   COVID-19 Vaccine (4 - Moderna series) 05/28/2021   Pneumonia Vaccine 75+ Years old (2 - PCV) 02/11/2022   INFLUENZA VACCINE  04/08/2022   Medicare Annual Wellness (AWV)  07/04/2022   Hepatitis C Screening  Completed   Zoster Vaccines- Shingrix  Completed   HPV VACCINES  Aged Out    Health Maintenance  Health Maintenance Due  Topic Date Due   TETANUS/TDAP  Never done   COVID-19 Vaccine (4 - Moderna series) 05/28/2021   Pneumonia Vaccine 68+ Years old (2 - PCV) 02/11/2022   INFLUENZA VACCINE   04/08/2022   Medicare Annual Wellness (AWV)  07/04/2022    Colorectal cancer screening: No longer required.   Lung Cancer Screening: (Low Dose CT Chest recommended if Age 46-80 years, 30 pack-year currently smoking OR have quit w/in 15years.) does not qualify.   Lung Cancer Screening Referral: no  Additional Screening:  Hepatitis C Screening: does qualify; Completed 08/10/2018  Vision Screening: Recommended annual ophthalmology exams for early detection of glaucoma and other disorders of the eye. Is the patient up to date with their annual eye exam?  Yes  Who is the provider or what is the name of the office in which the patient attends annual eye exams? Can't remember name If pt is not established with a provider, would they like to be referred to a provider to establish care? No .   Dental Screening: Recommended annual dental exams for proper oral hygiene  Community Resource Referral / Chronic Care Management: CRR required this visit?  No   CCM required this visit?  No      Plan:     I have personally reviewed and noted the following in the patient's chart:   Medical and social history Use of alcohol, tobacco or illicit drugs  Current medications and supplements including opioid prescriptions. Patient is not currently taking opioid prescriptions. Functional ability  and status Nutritional status Physical activity Advanced directives List of other physicians Hospitalizations, surgeries, and ER visits in previous 12 months Vitals Screenings to include cognitive, depression, and falls Referrals and appointments  In addition, I have reviewed and discussed with patient certain preventive protocols, quality metrics, and best practice recommendations. A written personalized care plan for preventive services as well as general preventive health recommendations were provided to patient.     Barb Merino, LPN   94/04/164   Nurse Notes: none  Due to this being a virtual  visit, the after visit summary with patients personalized plan was offered to patient via mail or my-chart.  to pick up at office at next visit

## 2022-07-09 NOTE — Patient Instructions (Signed)
[name] , Thank you for taking time to come for your Medicare Wellness Visit. I appreciate your ongoing commitment to your health goals. Please review the following plan we discussed and let me know if I can assist you in the future.   Screening recommendations/referrals: Colonoscopy: not required Recommended yearly ophthalmology/optometry visit for glaucoma screening and checkup Recommended yearly dental visit for hygiene and checkup  Vaccinations: Influenza vaccine: decline Pneumococcal vaccine: due Tdap vaccine: due Shingles vaccine: completed   Covid-19:  04/02/2021, 07/23/2020, 12/13/2019, 11/14/2019  Advanced directives: Advance directive discussed with you today.   Conditions/risks identified: none  Next appointment: Follow up in one year for your annual wellness visit.   Preventive Care 12 Years and Older, Male Preventive care refers to lifestyle choices and visits with your health care provider that can promote health and wellness. What does preventive care include? A yearly physical exam. This is also called an annual well check. Dental exams once or twice a year. Routine eye exams. Ask your health care provider how often you should have your eyes checked. Personal lifestyle choices, including: Daily care of your teeth and gums. Regular physical activity. Eating a healthy diet. Avoiding tobacco and drug use. Limiting alcohol use. Practicing safe sex. Taking low doses of aspirin every day. Taking vitamin and mineral supplements as recommended by your health care provider. What happens during an annual well check? The services and screenings done by your health care provider during your annual well check will depend on your age, overall health, lifestyle risk factors, and family history of disease. Counseling  Your health care provider may ask you questions about your: Alcohol use. Tobacco use. Drug use. Emotional well-being. Home and relationship well-being. Sexual  activity. Eating habits. History of falls. Memory and ability to understand (cognition). Work and work Statistician. Screening  You may have the following tests or measurements: Height, weight, and BMI. Blood pressure. Lipid and cholesterol levels. These may be checked every 5 years, or more frequently if you are over 68 years old. Skin check. Lung cancer screening. You may have this screening every year starting at age 43 if you have a 30-pack-year history of smoking and currently smoke or have quit within the past 15 years. Fecal occult blood test (FOBT) of the stool. You may have this test every year starting at age 14. Flexible sigmoidoscopy or colonoscopy. You may have a sigmoidoscopy every 5 years or a colonoscopy every 10 years starting at age 46. Prostate cancer screening. Recommendations will vary depending on your family history and other risks. Hepatitis C blood test. Hepatitis B blood test. Sexually transmitted disease (STD) testing. Diabetes screening. This is done by checking your blood sugar (glucose) after you have not eaten for a while (fasting). You may have this done every 1-3 years. Abdominal aortic aneurysm (AAA) screening. You may need this if you are a current or former smoker. Osteoporosis. You may be screened starting at age 56 if you are at high risk. Talk with your health care provider about your test results, treatment options, and if necessary, the need for more tests. Vaccines  Your health care provider may recommend certain vaccines, such as: Influenza vaccine. This is recommended every year. Tetanus, diphtheria, and acellular pertussis (Tdap, Td) vaccine. You may need a Td booster every 10 years. Zoster vaccine. You may need this after age 51. Pneumococcal 13-valent conjugate (PCV13) vaccine. One dose is recommended after age 67. Pneumococcal polysaccharide (PPSV23) vaccine. One dose is recommended after age 53. Talk  to your health care provider about which  screenings and vaccines you need and how often you need them. This information is not intended to replace advice given to you by your health care provider. Make sure you discuss any questions you have with your health care provider. Document Released: 09/21/2015 Document Revised: 05/14/2016 Document Reviewed: 06/26/2015 Elsevier Interactive Patient Education  2017 [place] Prevention in the Home Falls can cause injuries. They can happen to people of all ages. There are many things you can do to make your home safe and to help prevent falls. What can I do on the outside of my home? Regularly fix the edges of walkways and driveways and fix any cracks. Remove anything that might make you trip as you walk through a door, such as a raised step or threshold. Trim any bushes or trees on the path to your home. Use bright outdoor lighting. Clear any walking paths of anything that might make someone trip, such as rocks or tools. Regularly check to see if handrails are loose or broken. Make sure that both sides of any steps have handrails. Any raised decks and porches should have guardrails on the edges. Have any leaves, snow, or ice cleared regularly. Use sand or salt on walking paths during winter. Clean up any spills in your garage right away. This includes oil or grease spills. What can I do in the bathroom? Use night lights. Install grab bars by the toilet and in the tub and shower. Do not use towel bars as grab bars. Use non-skid mats or decals in the tub or shower. If you need to sit down in the shower, use a plastic, non-slip stool. Keep the floor dry. Clean up any water that spills on the floor as soon as it happens. Remove soap buildup in the tub or shower regularly. Attach bath mats securely with double-sided non-slip rug tape. Do not have throw rugs and other things on the floor that can make you trip. What can I do in the bedroom? Use night lights. Make sure that you have a  light by your bed that is easy to reach. Do not use any sheets or blankets that are too big for your bed. They should not hang down onto the floor. Have a firm chair that has side arms. You can use this for support while you get dressed. Do not have throw rugs and other things on the floor that can make you trip. What can I do in the kitchen? Clean up any spills right away. Avoid walking on wet floors. Keep items that you use a lot in easy-to-reach places. If you need to reach something above you, use a strong step stool that has a grab bar. Keep electrical cords out of the way. Do not use floor polish or wax that makes floors slippery. If you must use wax, use non-skid floor wax. Do not have throw rugs and other things on the floor that can make you trip. What can I do with my stairs? Do not leave any items on the stairs. Make sure that there are handrails on both sides of the stairs and use them. Fix handrails that are broken or loose. Make sure that handrails are as long as the stairways. Check any carpeting to make sure that it is firmly attached to the stairs. Fix any carpet that is loose or worn. Avoid having throw rugs at the top or bottom of the stairs. If you do have throw rugs,  attach them to the floor with carpet tape. Make sure that you have a light switch at the top of the stairs and the bottom of the stairs. If you do not have them, ask someone to add them for you. What else can I do to help prevent falls? Wear shoes that: Do not have high heels. Have rubber bottoms. Are comfortable and fit you well. Are closed at the toe. Do not wear sandals. If you use a stepladder: Make sure that it is fully opened. Do not climb a closed stepladder. Make sure that both sides of the stepladder are locked into place. Ask someone to hold it for you, if possible. Clearly mark and make sure that you can see: Any grab bars or handrails. First and last steps. Where the edge of each step  is. Use tools that help you move around (mobility aids) if they are needed. These include: Canes. Walkers. Scooters. Crutches. Turn on the lights when you go into a dark area. Replace any light bulbs as soon as they burn out. Set up your furniture so you have a clear path. Avoid moving your furniture around. If any of your floors are uneven, fix them. If there are any pets around you, be aware of where they are. Review your medicines with your doctor. Some medicines can make you feel dizzy. This can increase your chance of falling. Ask your doctor what other things that you can do to help prevent falls. This information is not intended to replace advice given to you by your health care provider. Make sure you discuss any questions you have with your health care provider. Document Released: 06/21/2009 Document Revised: 01/31/2016 Document Reviewed: 09/29/2014 Elsevier Interactive Patient Education  2017 [employer].

## 2022-08-25 ENCOUNTER — Encounter: Payer: Self-pay | Admitting: Internal Medicine

## 2022-08-25 ENCOUNTER — Ambulatory Visit (INDEPENDENT_AMBULATORY_CARE_PROVIDER_SITE_OTHER): Payer: Medicare HMO | Admitting: Internal Medicine

## 2022-08-25 VITALS — BP 132/88 | HR 81 | Temp 98.4°F | Ht 69.0 in | Wt 272.0 lb

## 2022-08-25 DIAGNOSIS — Z23 Encounter for immunization: Secondary | ICD-10-CM | POA: Diagnosis not present

## 2022-08-25 DIAGNOSIS — I5022 Chronic systolic (congestive) heart failure: Secondary | ICD-10-CM | POA: Diagnosis not present

## 2022-08-25 DIAGNOSIS — N1831 Chronic kidney disease, stage 3a: Secondary | ICD-10-CM

## 2022-08-25 DIAGNOSIS — E7800 Pure hypercholesterolemia, unspecified: Secondary | ICD-10-CM

## 2022-08-25 DIAGNOSIS — B351 Tinea unguium: Secondary | ICD-10-CM

## 2022-08-25 DIAGNOSIS — I442 Atrioventricular block, complete: Secondary | ICD-10-CM | POA: Diagnosis not present

## 2022-08-25 DIAGNOSIS — I130 Hypertensive heart and chronic kidney disease with heart failure and stage 1 through stage 4 chronic kidney disease, or unspecified chronic kidney disease: Secondary | ICD-10-CM | POA: Diagnosis not present

## 2022-08-25 DIAGNOSIS — R7309 Other abnormal glucose: Secondary | ICD-10-CM

## 2022-08-25 DIAGNOSIS — R635 Abnormal weight gain: Secondary | ICD-10-CM

## 2022-08-25 DIAGNOSIS — M1A39X0 Chronic gout due to renal impairment, multiple sites, without tophus (tophi): Secondary | ICD-10-CM

## 2022-08-25 DIAGNOSIS — Z6841 Body Mass Index (BMI) 40.0 and over, adult: Secondary | ICD-10-CM

## 2022-08-25 MED ORDER — CICLOPIROX 8 % EX SOLN: Freq: Every day | CUTANEOUS | 0 refills | Status: AC

## 2022-08-25 NOTE — Patient Instructions (Addendum)
The 10-year ASCVD risk score (Arnett DK, et al., 2019) is: 24.2%*   Values used to calculate the score:     Age: 76 years     Sex: Male     Is Non-Hispanic African American: Yes     Diabetic: No     Tobacco smoker: No     Systolic Blood Pressure: 132 mmHg     Is BP treated: Yes     HDL Cholesterol: 56 mg/dL*     Total Cholesterol: 189 mg/dL*     * - Cholesterol units were assumed for this score calculation   Hypertension, Adult Hypertension is another name for high blood pressure. High blood pressure forces your heart to work harder to pump blood. This can cause problems over time. There are two numbers in a blood pressure reading. There is a top number (systolic) over a bottom number (diastolic). It is best to have a blood pressure that is below 120/80. What are the causes? The cause of this condition is not known. Some other conditions can lead to high blood pressure. What increases the risk? Some lifestyle factors can make you more likely to develop high blood pressure: Smoking. Not getting enough exercise or physical activity. Being overweight. Having too much fat, sugar, calories, or salt (sodium) in your diet. Drinking too much alcohol. Other risk factors include: Having any of these conditions: Heart disease. Diabetes. High cholesterol. Kidney disease. Obstructive sleep apnea. Having a family history of high blood pressure and high cholesterol. Age. The risk increases with age. Stress. What are the signs or symptoms? High blood pressure may not cause symptoms. Very high blood pressure (hypertensive crisis) may cause: Headache. Fast or uneven heartbeats (palpitations). Shortness of breath. Nosebleed. Vomiting or feeling like you may vomit (nauseous). Changes in how you see. Very bad chest pain. Feeling dizzy. Seizures. How is this treated? This condition is treated by making healthy lifestyle changes, such as: Eating healthy foods. Exercising more. Drinking  less alcohol. Your doctor may prescribe medicine if lifestyle changes do not help enough and if: Your top number is above 130. Your bottom number is above 80. Your personal target blood pressure may vary. Follow these instructions at home: Eating and drinking  If told, follow the DASH eating plan. To follow this plan: Fill one half of your plate at each meal with fruits and vegetables. Fill one fourth of your plate at each meal with whole grains. Whole grains include whole-wheat pasta, brown rice, and whole-grain bread. Eat or drink low-fat dairy products, such as skim milk or low-fat yogurt. Fill one fourth of your plate at each meal with low-fat (lean) proteins. Low-fat proteins include fish, chicken without skin, eggs, beans, and tofu. Avoid fatty meat, cured and processed meat, or chicken with skin. Avoid pre-made or processed food. Limit the amount of salt in your diet to less than 1,500 mg each day. Do not drink alcohol if: Your doctor tells you not to drink. You are pregnant, may be pregnant, or are planning to become pregnant. If you drink alcohol: Limit how much you have to: 0-1 drink a day for women. 0-2 drinks a day for men. Know how much alcohol is in your drink. In the [place] one drink equals one 12 oz bottle of beer (355 mL), one 5 oz glass of wine (148 mL), or one 1 oz glass of hard liquor (44 mL). Lifestyle  Work with your doctor to stay at a healthy weight or to lose weight. Ask your  doctor what the best weight is for you. Get at least 30 minutes of exercise that causes your heart to beat faster (aerobic exercise) most days of the week. This may include walking, swimming, or biking. Get at least 30 minutes of exercise that strengthens your muscles (resistance exercise) at least 3 days a week. This may include lifting weights or doing Pilates. Do not smoke or use any products that contain nicotine or tobacco. If you need help quitting, ask your doctor. Check your blood  pressure at home as told by your doctor. Keep all follow-up visits. Medicines Take over-the-counter and prescription medicines only as told by your doctor. Follow directions carefully. Do not skip doses of blood pressure medicine. The medicine does not work as well if you skip doses. Skipping doses also puts you at risk for problems. Ask your doctor about side effects or reactions to medicines that you should watch for. Contact a doctor if: You think you are having a reaction to the medicine you are taking. You have headaches that keep coming back. You feel dizzy. You have swelling in your ankles. You have trouble with your vision. Get help right away if: You get a very bad headache. You start to feel mixed up (confused). You feel weak or numb. You feel faint. You have very bad pain in your: Chest. Belly (abdomen). You vomit more than once. You have trouble breathing. These symptoms may be an emergency. Get help right away. Call 911. Do not wait to see if the symptoms will go away. Do not drive yourself to the hospital. Summary Hypertension is another name for high blood pressure. High blood pressure forces your heart to work harder to pump blood. For most people, a normal blood pressure is less than 120/80. Making healthy choices can help lower blood pressure. If your blood pressure does not get lower with healthy choices, you may need to take medicine. This information is not intended to replace advice given to you by your health care provider. Make sure you discuss any questions you have with your health care provider. Document Revised: 06/13/2021 Document Reviewed: 06/13/2021 Elsevier Patient Education  2023 [employer].

## 2022-08-25 NOTE — Progress Notes (Signed)
Rich Brave [place],acting as a Education administrator for [name], MD.,have documented all relevant documentation on the behalf of [name], MD,as directed by  [name], MD while in the presence of [name], MD.    Subjective:     Patient ID: [name] , male    DOB: March 18, 1946 , 76 y.o.   MRN: [id number]   Chief Complaint  Patient presents with   Hypertension    HPI  Patient presents today for a bp check.  He reports compliance with meds. He denies headaches, chest pain and shortness of breath. He states he feels fine and has no issues with mobility.   Hypertension This is a chronic problem. The current episode started more than 1 year ago. The problem has been gradually improving since onset. The problem is controlled. Pertinent negatives include no blurred vision, chest pain, headaches or shortness of breath. Risk factors for coronary artery disease include dyslipidemia, male gender and obesity. Compliance problems include diet.  Identifiable causes of hypertension include chronic renal disease.     Past Medical History:  Diagnosis Date   Chronic renal insufficiency    WITH BASELINE CREATININE OF 1.6   Chronic systolic CHF (congestive heart failure) (HCC)    Complete heart block (HCC)    s/p PPM by Dr Verlon Setting   Coronary disease    NONOBSTRUCTIVE   GERD (gastroesophageal reflux disease)    Gout    Hyperkalemia    Hypertension    Nonischemic cardiomyopathy (HCC)    Presence of permanent cardiac pacemaker      Family History  Problem Relation Age of Onset   Diabetes Father      Current Outpatient Medications:    allopurinol (ZYLOPRIM) 100 MG tablet, TAKE 1 TABLET BY MOUTH EVERY DAY, Disp: 90 tablet, Rfl: 1   Ascorbic Acid (VITAMIN C) 1000 MG tablet, Take 2,000 mg by mouth daily. , Disp: , Rfl:    aspirin EC 81 MG tablet, Take 81 mg by mouth daily., Disp: , Rfl:    atorvastatin (LIPITOR) 40 MG tablet, Take 1 tablet (40 mg total) by mouth daily.,  Disp: 90 tablet, Rfl: 1   carvedilol (COREG) 12.5 MG tablet, Take 1 tablet (12.5 mg total) by mouth 2 (two) times daily., Disp: 180 tablet, Rfl: 3   Cholecalciferol (VITAMIN D-3) 1000 UNITS CAPS, Take 2,000 Units by mouth daily. , Disp: , Rfl:    ciclopirox (PENLAC) 8 % solution, Apply topically at bedtime. Apply over nail and surrounding skin. Apply daily over previous coat. After seven (7) days, may remove with alcohol and continue cycle., Disp: 6.6 mL, Rfl: 0   Colchicine 0.6 MG CAPS, Take 0.6 mg by mouth daily as needed (gout)., Disp: , Rfl:    Fexofenadine HCl (ALLEGRA PO), Take 1 tablet by mouth daily as needed (itching)., Disp: , Rfl:    fluticasone-salmeterol (ADVAIR DISKUS) 250-50 MCG/ACT AEPB, Inhale 1 puff twice a day in the morning and evening 12 hours apart, Disp: 180 each, Rfl: 1   furosemide (LASIX) 40 MG tablet, TAKE 1 TABLET BY MOUTH EVERY DAY, Disp: 90 tablet, Rfl: 1   Garlic 5631 MG CAPS, Take 3,000 mg by mouth daily., Disp: , Rfl:    hydrALAZINE (APRESOLINE) 25 MG tablet, Take 1 tablet (25 mg total) by mouth 3 (three) times daily., Disp: 270 tablet, Rfl: 3   Allergies  Allergen Reactions   Avalide [Irbesartan-Hydrochlorothiazide] Anaphylaxis   Penicillins Anaphylaxis    Did it involve swelling  of the face/tongue/throat, SOB, or low BP? Yes Did it involve sudden or severe rash/hives, skin peeling, or any reaction on the inside of your mouth or nose? Yes Did you need to seek medical attention at a hospital or doctor's office? Reaction occurred at MD office - penicillin injection When did it last happen?      young adult If all above answers are "NO", may proceed with cephalosporin use.   Tinactin [Tolnaftate] Hives and Rash          Review of Systems  Constitutional: Negative.   Eyes: Negative.  Negative for blurred vision.  Respiratory: Negative.  Negative for shortness of breath.   Cardiovascular: Negative.  Negative for chest pain.  Gastrointestinal: Negative.    Musculoskeletal: Negative.   Skin: Negative.   Neurological: Negative.  Negative for headaches.  Psychiatric/Behavioral: Negative.       Today's Vitals   08/25/22 0843  BP: 132/88  Pulse: 81  Temp: 98.4 F (36.9 C)  TempSrc: Oral  Weight: 272 lb (123.4 kg)  Height: _0  (1.753 m)   Body mass index is 40.17 kg/m.  BP Readings from Last 3 Encounters:  08/25/22 132/88  02/20/22 (!) 144/82  12/16/21 118/60   Wt Readings from Last 3 Encounters:  08/25/22 272 lb (123.4 kg)  07/09/22 260 lb (117.9 kg)  02/20/22 267 lb (121.1 kg)     Objective:  Physical Exam Vitals and nursing note reviewed.  Constitutional:      Appearance: Normal appearance.  HENT:     Nose:     Comments: Masked     Mouth/Throat:     Comments: Masked  Eyes:     Extraocular Movements: Extraocular movements intact.  Cardiovascular:     Rate and Rhythm: Normal rate and regular rhythm.     Heart sounds: Normal heart sounds.  Pulmonary:     Effort: Pulmonary effort is normal.     Breath sounds: Normal breath sounds.  Musculoskeletal:     Cervical back: Normal range of motion.     Right lower leg: Edema present.     Left lower leg: Edema present.  Skin:    General: Skin is warm.     Comments: Darkened, thickened nails on hands b/l  Neurological:     General: No focal deficit present.     Mental Status: He is alert.  Psychiatric:        Mood and Affect: Mood normal.       Assessment And Plan:     1. 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 Goal BP<130/80. He will c/w current meds. He is encouraged to limit his salt intake. After noticing marked LE edema on exam, he admits he has not been taking furosemide. States "it causes you to lose minerals". Pt reminded of CHF dx and advised to resume daily dosing.  - CMP14+EGFR  2. Chronic systolic CHF (congestive heart failure) (HCC) Chronic, he admits to non-compliance with lasix. He is  clearly volume overloaded. I will check labs as below. I will also refer him for echocardiogram.  - CMP14+EGFR - Brain natriuretic peptide  3. Complete heart block (HCC) Comments: He is s/p ppm placement.  4. Stage 3a chronic kidney disease ([place]) Comments: Chronic, he is reminded to avoid NSAIDs, stay hydrated and keep BP under optimal control.  5. Onychomycosis Comments: I will send rx Penlac, apply to affected nails qd, removing once weekly with alcohol. Pt advised it will  take at least 4 weeks before he notices an improvement.  6. Weight gain Comments: He has gained 12 lbs in a month. He also has LE edema, will check BNP today as well. He denies SOB and orthopnea. - TSH  7. Pure hypercholesterolemia Comments: Previously on atorvastatin, he states he is no longer taking the medication. ASCVD risk is 24%, risks of non-compliance d/w pt in detail.  8. Other abnormal glucose Comments: I will check an a1c today, last a1c 6.3. He is encouraged to cut out sugary beverages and foods from his diet. - Hemoglobin A1c  9. Chronic gout due to renal impairment of multiple sites without tophus Comments: Chronic, I will check uric acid level today. He is encouraged to stay hydrated and avoid known triggers. - Uric acid  10. Class 3 severe obesity due to excess calories with serious comorbidity and body mass index (BMI) of 40.0 to 44.9 in adult [facility]) Comments: BMI 40. He is encouraged to aim for at least 150 minutes of exercise/week, while initially striving for BMI<35 to decrease cardiac risk.  11. Immunization due - Pneumococcal conjugate vaccine 20-valent (Prevnar 20)     Patient was given opportunity to ask questions. Patient verbalized understanding of the plan and was able to repeat key elements of the plan. All questions were answered to their satisfaction.   I, [name], MD, have reviewed all documentation for this visit. The documentation on 08/25/22 for the exam, diagnosis,  procedures, and orders are all accurate and complete.   IF YOU HAVE BEEN REFERRED TO A SPECIALIST, IT MAY TAKE 1-2 WEEKS TO SCHEDULE/PROCESS THE REFERRAL. IF YOU HAVE NOT HEARD FROM US/SPECIALIST IN TWO WEEKS, PLEASE GIVE [place] A CALL AT [phone] X [phone].   THE PATIENT IS ENCOURAGED TO PRACTICE SOCIAL DISTANCING DUE TO THE COVID-19 PANDEMIC.

## 2022-08-26 LAB — CMP14+EGFR
ALT: 19 IU/L (ref 0–44)
AST: 20 IU/L (ref 0–40)
Albumin/Globulin Ratio: 1.1 — ABNORMAL LOW (ref 1.2–2.2)
Albumin: 3.8 g/dL (ref 3.8–4.8)
Alkaline Phosphatase: 329 IU/L — ABNORMAL HIGH (ref 44–121)
BUN/Creatinine Ratio: 16 (ref 10–24)
BUN: 24 mg/dL (ref 8–27)
Bilirubin Total: 0.9 mg/dL (ref 0.0–1.2)
CO2: 22 mmol/L (ref 20–29)
Calcium: 9.3 mg/dL (ref 8.6–10.2)
Chloride: 104 mmol/L (ref 96–106)
Creatinine, Ser: 1.53 mg/dL — ABNORMAL HIGH (ref 0.76–1.27)
Globulin, Total: 3.4 g/dL (ref 1.5–4.5)
Glucose: 98 mg/dL (ref 70–99)
Potassium: 5.2 mmol/L (ref 3.5–5.2)
Sodium: 140 mmol/L (ref 134–144)
Total Protein: 7.2 g/dL (ref 6.0–8.5)
eGFR: 46 mL/min/{1.73_m2} — ABNORMAL LOW (ref >59)

## 2022-08-26 LAB — BRAIN NATRIURETIC PEPTIDE: BNP: 39.5 pg/mL (ref 0.0–100.0)

## 2022-08-26 LAB — TSH: TSH: 1.63 u[IU]/mL (ref 0.450–4.500)

## 2022-08-26 LAB — HEMOGLOBIN A1C
Est. average glucose Bld gHb Est-mCnc: 131 mg/dL
Hgb A1c MFr Bld: 6.2 % — ABNORMAL HIGH (ref 4.8–5.6)

## 2022-08-26 LAB — URIC ACID: Uric Acid: 6.4 mg/dL (ref 3.8–8.4)

## 2022-09-24 ENCOUNTER — Ambulatory Visit (HOSPITAL_COMMUNITY): Payer: Medicare HMO | Attending: Cardiology

## 2022-09-24 DIAGNOSIS — I5022 Chronic systolic (congestive) heart failure: Secondary | ICD-10-CM | POA: Diagnosis not present

## 2022-09-24 LAB — ECHOCARDIOGRAM COMPLETE
Area-P 1/2: 3.35 cm2
S' Lateral: 3.4 cm

## 2022-10-15 ENCOUNTER — Other Ambulatory Visit: Payer: Self-pay | Admitting: Internal Medicine

## 2022-11-18 NOTE — Progress Notes (Signed)
[name] Date of Birth: 12-Nov-1945   History of Present Illness: [name] is seen today for followup of congestive heart failure. He was last seen by me in March 2022. He has been noncompliant with medical therapy and follow up here and in the device clinic. He has a history of complete heart block and presented in September of 2010 with a heart rate of 10. He had a permanent pacemaker placed at that time. He did have acute renal failure and hyperkalemia at the time. He was on either an ACE inhibitor or ARB at that time. In November 2014 he presented with worsening CHF.  Echocardiogram showed marked LV dysfunction with ejection fraction of 20-25%. He had moderate pulmonary HTN. He was started on nitrates and hydralazine. He underwent right and left heart cath on 08/16/13 with moderate pulmonary HTN and elevated filling pressures. 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   He declined ICD. In June 2016 he was found to have lead fractures of atrial and ventricular leads and underwent revision of pacemaker leads and upgrade to CRT device. Repeat Echo in September 2019 showed persistent LV dysfunction with EF 25-30%. Last device check 01/03/20 was normal.  He was seen in the ED in January 2021 with acute neck pain. CT head and neck with angio was negative. Also had CT chest/abd/pelvis. This did show coronary and aortic atherosclerosis without dissection.   He was seen more recently by Dr [name]. BNP level was normal. Echo was repeated and showed improvement in EF from 25-30% to 45-50%. Moderate pulmonary HTN.   On follow up today he states he feels great. No chest pain or dyspnea. No palpitations. He still has swelling in his legs. Being fitted for compression stockings at the [place].   He is taking lasix 40 mg daily, hydralazine, and Coreg. He is going to the gym 5 days a week. Feels energy is good. Pacer device followed at the [place].    Current Outpatient Medications on File Prior to Visit   Medication Sig Dispense Refill   allopurinol (ZYLOPRIM) 100 MG tablet TAKE 1 TABLET BY MOUTH EVERY DAY 90 tablet 1   Ascorbic Acid (VITAMIN C) 1000 MG tablet Take 2,000 mg by mouth daily.      aspirin EC 81 MG tablet Take 81 mg by mouth daily.     carvedilol (COREG) 12.5 MG tablet Take 1 tablet (12.5 mg total) by mouth 2 (two) times daily. 180 tablet 3   Cholecalciferol (VITAMIN D-3) 1000 UNITS CAPS Take 2,000 Units by mouth daily.      ciclopirox (PENLAC) 8 % solution Apply topically at bedtime. Apply over nail and surrounding skin. Apply daily over previous coat. After seven (7) days, may remove with alcohol and continue cycle. 6.6 mL 0   Colchicine 0.6 MG CAPS Take 0.6 mg by mouth daily as needed (gout).     Fexofenadine HCl (ALLEGRA PO) Take 1 tablet by mouth daily as needed (itching).     fluticasone-salmeterol (ADVAIR DISKUS) 250-50 MCG/ACT AEPB Inhale 1 puff twice a day in the morning and evening 12 hours apart 180 each 1   furosemide (LASIX) 40 MG tablet TAKE 1 TABLET BY MOUTH EVERY DAY 90 tablet 1   Garlic 123XX123 MG CAPS Take 3,000 mg by mouth daily.     atorvastatin (LIPITOR) 40 MG tablet Take 1 tablet (40 mg total) by mouth daily. (Patient not taking: Reported on 11/19/2022) 90 tablet 1   hydrALAZINE (APRESOLINE)  25 MG tablet Take 1 tablet (25 mg total) by mouth 3 (three) times daily. 270 tablet 3   No current facility-administered medications on file prior to visit.    Allergies  Allergen Reactions   Avalide [Irbesartan-Hydrochlorothiazide] Anaphylaxis   Penicillins Anaphylaxis    Did it involve swelling of the face/tongue/throat, SOB, or low BP? Yes Did it involve sudden or severe rash/hives, skin peeling, or any reaction on the inside of your mouth or nose? Yes Did you need to seek medical attention at a hospital or doctor's office? Reaction occurred at MD office - penicillin injection When did it last happen?      young adult If all above answers are "NO", may proceed with  cephalosporin use.   Tinactin [Tolnaftate] Hives and Rash         Past Medical History:  Diagnosis Date   Chronic renal insufficiency    WITH BASELINE CREATININE OF 1.6   Chronic systolic CHF (congestive heart failure) (HCC)    Complete heart block (HCC)    s/p PPM by Dr [name]   Coronary disease    NONOBSTRUCTIVE   GERD (gastroesophageal reflux disease)    Gout    Hyperkalemia    Hypertension    Nonischemic cardiomyopathy ([place])    Presence of permanent cardiac pacemaker     Past Surgical History:  Procedure Laterality Date   CARDIAC CATHETERIZATION  08/2013   EP IMPLANTABLE DEVICE N/A 02/15/2015   Procedure: Lead Revision/Repair;  Surgeon: Gregory Grayer, MD;  Location: [place] [facility];  Service: Cardiovascular;  Laterality: N/A;   INGUINAL HERNIA REPAIR Bilateral    INSERT / REPLACE / REMOVE PACEMAKER  05/20/2009   MDT by Dr [name] for complete heart block   LEFT AND RIGHT HEART CATHETERIZATION WITH CORONARY ANGIOGRAM N/A 08/16/2013   Procedure: LEFT AND RIGHT HEART CATHETERIZATION WITH CORONARY ANGIOGRAM;  Surgeon: Gregory Levels M Martin[place], MD;  Location: [facility] CATH LAB;  Service: Cardiovascular;  Laterality: N/A;    Social History   Tobacco Use  Smoking Status Former   Packs/day: 1.00   Years: 15.00   Total pack years: 15.00   Types: Cigarettes  Smokeless Tobacco Never  Tobacco Comments   "quit smoking cigarettes in 1985"    Social History   Substance and Sexual Activity  Alcohol Use Yes   Alcohol/week: 7.0 standard drinks of alcohol   Types: 7 Shots of liquor per week    Family History  Problem Relation Age of Onset   Diabetes Father     Review of Systems:  As note in HPI. All other systems were reviewed and are negative.  Physical Exam: BP 128/72   Pulse 90   Ht '5\' 9"'$  (1.753 m)   Wt 264 lb (119.7 kg)   SpO2 97%   BMI 38.99 kg/m   GENERAL:  Well appearing overweight BM in NAD HEENT:  PERRL, EOMI, sclera are clear. Oropharynx is clear. NECK:  No jugular  venous distention, carotid upstroke brisk and symmetric, no bruits, no thyromegaly or adenopathy LUNGS:  Clear to auscultation bilaterally CHEST:  Unremarkable HEART:  RRR,  PMI not displaced or sustained,S1 and S2 within normal limits, no S3, no S4: no clicks, no rubs, no murmurs ABD:  Soft, nontender. BS +, no masses or bruits. No hepatomegaly, no splenomegaly EXT:  2 + pulses throughout, 2+ pretibial edema, no cyanosis no clubbing SKIN:  Warm and dry.  No rashes NEURO:  Alert and oriented x 3. Cranial nerves II through XII intact.  PSYCH:  Cognitively intact    LABORATORY DATA: Lab Results  Component Value Date   WBC 6.0 02/20/2022   HGB 13.0 02/20/2022   HCT 40.5 02/20/2022   PLT 177 02/20/2022   GLUCOSE 98 08/25/2022   CHOL 195 02/20/2022   TRIG 87 02/20/2022   HDL 56 02/20/2022   LDLCALC 123 (H) 02/20/2022   ALT 19 08/25/2022   AST 20 08/25/2022   NA 140 08/25/2022   K 5.2 08/25/2022   CL 104 08/25/2022   CREATININE 1.53 (H) 08/25/2022   BUN 24 08/25/2022   CO2 22 08/25/2022   TSH 1.630 08/25/2022   INR 1.18 02/14/2015   HGBA1C 6.2 (H) 08/25/2022   MICROALBUR 80 02/11/2021    Dated 07/09/17: cholesterol 203, triglycerides 62, HDL 72, LDL 119.  Dated 01/06/18: creatinine 1.48. A1c 5.9%. Chemistries Normal   Ecg today shows NSR with BiV pacing rate 90. I have personally reviewed and interpreted this study.    Echo 06/02/18: Study Conclusions   - Left ventricle: The cavity size was moderately dilated. There was   moderate concentric hypertrophy. Systolic function was severely   reduced. The estimated ejection fraction was in the range of 25%   to 30%. Severe diffuse hypokinesis. There was an increased   relative contribution of atrial contraction to ventricular   filling. Doppler parameters are consistent with abnormal left   ventricular relaxation (grade 1 diastolic dysfunction). - Aortic valve: Trileaflet; moderately thickened, moderately   calcified leaflets. -  Mitral valve: There was mild regurgitation. - Left atrium: The atrium was mildly dilated. - Right ventricle: Pacer wire or catheter noted in right ventricle.   Systolic function was mildly to moderately reduced. - Right atrium: Pacer wire or catheter noted in right atrium. - Pulmonary arteries: Systolic pressure could not be accurately   estimated.  Echo 09/24/22: IMPRESSIONS     1. Left ventricular ejection fraction, by estimation, is 45 to 50%. The  left ventricle has mildly decreased function. The left ventricle  demonstrates global hypokinesis. There is mild concentric left ventricular  hypertrophy. Left ventricular diastolic  function could not be evaluated.   2. Right ventricular systolic function is normal. The right ventricular  size is normal. There is moderately elevated pulmonary artery systolic  pressure. The estimated right ventricular systolic pressure is [id number] mmHg.   3. Left atrial size was mildly dilated.   4. The mitral valve is grossly normal. Trivial mitral valve  regurgitation. No evidence of mitral stenosis.   5. The aortic valve is tricuspid. There is moderate calcification of the  aortic valve. There is moderate thickening of the aortic valve. Aortic  valve regurgitation is trivial. Aortic valve sclerosis/calcification is  present, without any evidence of  aortic stenosis.   6. The inferior vena cava is normal in size with greater than 50%  respiratory variability, suggesting right atrial pressure of 3 mmHg.   Comparison(s): Changes from prior study are noted.   Conclusion(s)/Recommendation(s): EF improved compared to prior. Right  atrial pressure normal, with moderately elevated pulmonary pressures.    Assessment / Plan: 1. Chronic systolic congestive heart failure.  Ejection fraction of 25-30% in past. S/p CRT therapy.  Nonischemic.  He is intolerant to ACE inhibitors/ARBs related to hyperkalemia and acute renal failure. Most recent Echo showed improvement  in EF to 45-50%.  He is on Coreg today 12.5 mg bid, hydralazine,  and lasix. Continue current therapy  2. CKD stage 3. Followed by Dr. Justin Mend- Nephrology. Creatinine stable 1.53  3 HTN- control is good   4. Complete heart block status post permanent pacemaker. Now with CRT device. He states this is now followed at the [facility]. Discussed statin therapy but he doesn't want to take pills. States he can get his cholesterol down with diet.   6. Obesity  7. Aortic atherosclerosis. No obstructive CAD on prior cath.  I will plan on follow up in one year

## 2022-11-19 ENCOUNTER — Encounter: Payer: Self-pay | Admitting: Cardiology

## 2022-11-19 ENCOUNTER — Ambulatory Visit: Payer: Medicare HMO | Attending: Cardiology | Admitting: Cardiology

## 2022-11-19 VITALS — BP 128/72 | HR 90 | Ht 69.0 in | Wt 264.0 lb

## 2022-11-19 DIAGNOSIS — I5022 Chronic systolic (congestive) heart failure: Secondary | ICD-10-CM | POA: Diagnosis not present

## 2022-11-19 DIAGNOSIS — I442 Atrioventricular block, complete: Secondary | ICD-10-CM

## 2022-11-19 DIAGNOSIS — Z950 Presence of cardiac pacemaker: Secondary | ICD-10-CM | POA: Diagnosis not present

## 2022-11-19 DIAGNOSIS — I10 Essential (primary) hypertension: Secondary | ICD-10-CM

## 2023-03-04 ENCOUNTER — Encounter: Payer: Medicare HMO | Admitting: Internal Medicine

## 2023-03-04 NOTE — Progress Notes (Deleted)
Jeri Cos Llittleton,acting as a Neurosurgeon for [name], MD.,have documented all relevant documentation on the behalf of [name], MD,as directed by  [name], MD while in the presence of [name], MD.  Subjective:   Patient ID: [name] , male    DOB: 23-Feb-1946 , 77 y.o.   MRN: [id number]  No chief complaint on file.   HPI  He is here today for a full physical examination. He has no specific concerns or complaints at this time. He is also followed by [facility]. He reports compliance with meds. He denies headaches, chest pain and shortness of breath.   Hypertension This is a chronic problem. The current episode started more than 1 year ago. The problem has been gradually improving since onset. The problem is uncontrolled. Pertinent negatives include no blurred vision, chest pain, palpitations or shortness of breath. Risk factors for coronary artery disease include dyslipidemia, obesity and sedentary lifestyle. The current treatment provides moderate improvement. Compliance problems include exercise.  Hypertensive end-organ damage includes kidney disease.    Past Medical History:  Diagnosis Date  . Chronic renal insufficiency    WITH BASELINE CREATININE OF 1.6  . Chronic systolic CHF (congestive heart failure) (HCC)   . Complete heart block (HCC)    s/p PPM by Dr Reyes Ivan  . Coronary disease    NONOBSTRUCTIVE  . GERD (gastroesophageal reflux disease)   . Gout   . Hyperkalemia   . Hypertension   . Nonischemic cardiomyopathy (HCC)   . Presence of permanent cardiac pacemaker      Family History  Problem Relation Age of Onset  . Diabetes Father      Current Outpatient Medications:  .  allopurinol (ZYLOPRIM) 100 MG tablet, TAKE 1 TABLET BY MOUTH EVERY DAY, Disp: 90 tablet, Rfl: 1 .  Ascorbic Acid (VITAMIN C) 1000 MG tablet, Take 2,000 mg by mouth daily. , Disp: , Rfl:  .  aspirin EC 81 MG tablet, Take 81 mg by mouth daily., Disp: , Rfl:  .   atorvastatin (LIPITOR) 40 MG tablet, Take 1 tablet (40 mg total) by mouth daily. (Patient not taking: Reported on 11/19/2022), Disp: 90 tablet, Rfl: 1 .  carvedilol (COREG) 12.5 MG tablet, Take 1 tablet (12.5 mg total) by mouth 2 (two) times daily., Disp: 180 tablet, Rfl: 3 .  Cholecalciferol (VITAMIN D-3) 1000 UNITS CAPS, Take 2,000 Units by mouth daily. , Disp: , Rfl:  .  ciclopirox (PENLAC) 8 % solution, Apply topically at bedtime. Apply over nail and surrounding skin. Apply daily over previous coat. After seven (7) days, may remove with alcohol and continue cycle., Disp: 6.6 mL, Rfl: 0 .  Colchicine 0.6 MG CAPS, Take 0.6 mg by mouth daily as needed (gout)., Disp: , Rfl:  .  Fexofenadine HCl (ALLEGRA PO), Take 1 tablet by mouth daily as needed (itching)., Disp: , Rfl:  .  fluticasone-salmeterol (ADVAIR DISKUS) 250-50 MCG/ACT AEPB, Inhale 1 puff twice a day in the morning and evening 12 hours apart, Disp: 180 each, Rfl: 1 .  furosemide (LASIX) 40 MG tablet, TAKE 1 TABLET BY MOUTH EVERY DAY, Disp: 90 tablet, Rfl: 1 .  Garlic 1000 MG CAPS, Take 3,000 mg by mouth daily., Disp: , Rfl:  .  hydrALAZINE (APRESOLINE) 25 MG tablet, Take 1 tablet (25 mg total) by mouth 3 (three) times daily., Disp: 270 tablet, Rfl: 3   Allergies  Allergen Reactions  . Avalide [Irbesartan-Hydrochlorothiazide] Anaphylaxis  . Penicillins Anaphylaxis  Did it involve swelling of the face/tongue/throat, SOB, or low BP? Yes Did it involve sudden or severe rash/hives, skin peeling, or any reaction on the inside of your mouth or nose? Yes Did you need to seek medical attention at a hospital or doctor's office? Reaction occurred at MD office - penicillin injection When did it last happen?      young adult If all above answers are "NO", may proceed with cephalosporin use.  Dorris Singh [Tolnaftate] Hives and Rash          Men's preventive visit. Patient Health Questionnaire (PHQ-2) is  Flowsheet Row Clinical Support from  07/09/2022 in [facility]  PHQ-2 Total Score 0     . Patient is on a *** diet. Marital status: Divorced. Relevant history for alcohol use is:  Social History   Substance and Sexual Activity  Alcohol Use Yes  . Alcohol/week: 7.0 standard drinks of alcohol  . Types: 7 Shots of liquor per week  . Relevant history for tobacco use is:  Social History   Tobacco Use  Smoking Status Former  . Packs/day: 1.00  . Years: 15.00  . Additional pack years: 0.00  . Total pack years: 15.00  . Types: Cigarettes  Smokeless Tobacco Never  Tobacco Comments   "quit smoking cigarettes in 1985"  .   Review of Systems  Eyes:  Negative for blurred vision.  Respiratory:  Negative for shortness of breath.   Cardiovascular:  Negative for chest pain and palpitations.    There were no vitals filed for this visit. There is no height or weight on file to calculate BMI.  Wt Readings from Last 3 Encounters:  11/19/22 264 lb (119.7 kg)  08/25/22 272 lb (123.4 kg)  07/09/22 260 lb (117.9 kg)    Objective:  Physical Exam      Assessment And Plan:    1. Routine general medical examination at health care facility  2. 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 Other abnormal glucose  4. Pure hypercholesterolemia  5. Stage 3a chronic kidney disease (HCC)    No follow-ups on file. Patient was given opportunity to ask questions. Patient verbalized understanding of the plan and was able to repeat key elements of the plan. All questions were answered to their satisfaction.   [name], MD  I, [name], MD, have reviewed all documentation for this visit. The documentation on 03/04/23 for the exam, diagnosis, procedures, and orders are all accurate and complete.

## 2023-03-23 ENCOUNTER — Ambulatory Visit (INDEPENDENT_AMBULATORY_CARE_PROVIDER_SITE_OTHER): Payer: Medicare HMO | Admitting: Internal Medicine

## 2023-03-23 ENCOUNTER — Encounter: Payer: Self-pay | Admitting: Internal Medicine

## 2023-03-23 VITALS — BP 158/96 | HR 54 | Temp 97.7°F | Ht 69.0 in | Wt 263.0 lb

## 2023-03-23 DIAGNOSIS — Z0000 Encounter for general adult medical examination without abnormal findings: Secondary | ICD-10-CM

## 2023-03-23 DIAGNOSIS — I130 Hypertensive heart and chronic kidney disease with heart failure and stage 1 through stage 4 chronic kidney disease, or unspecified chronic kidney disease: Secondary | ICD-10-CM

## 2023-03-23 DIAGNOSIS — R7309 Other abnormal glucose: Secondary | ICD-10-CM | POA: Insufficient documentation

## 2023-03-23 DIAGNOSIS — N1831 Chronic kidney disease, stage 3a: Secondary | ICD-10-CM | POA: Diagnosis not present

## 2023-03-23 DIAGNOSIS — I5022 Chronic systolic (congestive) heart failure: Secondary | ICD-10-CM | POA: Diagnosis not present

## 2023-03-23 DIAGNOSIS — Z6838 Body mass index (BMI) 38.0-38.9, adult: Secondary | ICD-10-CM

## 2023-03-23 DIAGNOSIS — Z23 Encounter for immunization: Secondary | ICD-10-CM

## 2023-03-23 DIAGNOSIS — E7800 Pure hypercholesterolemia, unspecified: Secondary | ICD-10-CM

## 2023-03-23 LAB — POCT URINALYSIS DIPSTICK
Bilirubin, UA: NEGATIVE
Blood, UA: NEGATIVE
Glucose, UA: NEGATIVE
Ketones, UA: NEGATIVE
Leukocytes, UA: NEGATIVE
Nitrite, UA: NEGATIVE
Protein, UA: POSITIVE — AB
Spec Grav, UA: 1.025 (ref 1.010–1.025)
Urobilinogen, UA: 0.2 E.U./dL
pH, UA: 5.5 (ref 5.0–8.0)

## 2023-03-23 NOTE — Patient Instructions (Addendum)
Health Maintenance, Male Adopting a healthy lifestyle and getting preventive care are important in promoting health and wellness. Ask your health care provider about: The right schedule for you to have regular tests and exams. Things you can do on your own to prevent diseases and keep yourself healthy. What should I know about diet, weight, and exercise? Eat a healthy diet  Eat a diet that includes plenty of vegetables, fruits, low-fat dairy products, and lean protein. Do not eat a lot of foods that are high in solid fats, added sugars, or sodium. Maintain a healthy weight Body mass index (BMI) is a measurement that can be used to identify possible weight problems. It estimates body fat based on height and weight. Your health care provider can help determine your BMI and help you achieve or maintain a healthy weight. Get regular exercise Get regular exercise. This is one of the most important things you can do for your health. Most adults should: Exercise for at least 150 minutes each week. The exercise should increase your heart rate and make you sweat (moderate-intensity exercise). Do strengthening exercises at least twice a week. This is in addition to the moderate-intensity exercise. Spend less time sitting. Even light physical activity can be beneficial. Watch cholesterol and blood lipids Have your blood tested for lipids and cholesterol at 79 years of age, then have this test every 5 years. You may need to have your cholesterol levels checked more often if: Your lipid or cholesterol levels are high. You are older than 79 years of age. You are at high risk for heart disease. What should I know about cancer screening? Many types of cancers can be detected early and may often be prevented. Depending on your health history and family history, you may need to have cancer screening at various ages. This may include screening for: Colorectal cancer. Prostate cancer. Skin cancer. Lung  cancer. What should I know about heart disease, diabetes, and high blood pressure? Blood pressure and heart disease High blood pressure causes heart disease and increases the risk of stroke. This is more likely to develop in people who have high blood pressure readings or are overweight. Talk with your health care provider about your target blood pressure readings. Have your blood pressure checked: Every 3-5 years if you are 18-39 years of age. Every year if you are 40 years old or older. If you are between the ages of 65 and 75 and are a current or former smoker, ask your health care provider if you should have a one-time screening for abdominal aortic aneurysm (AAA). Diabetes Have regular diabetes screenings. This checks your fasting blood sugar level. Have the screening done: Once every three years after age 45 if you are at a normal weight and have a low risk for diabetes. More often and at a younger age if you are overweight or have a high risk for diabetes. What should I know about preventing infection? Hepatitis B If you have a higher risk for hepatitis B, you should be screened for this virus. Talk with your health care provider to find out if you are at risk for hepatitis B infection. Hepatitis C Blood testing is recommended for: Everyone born from 1945 through 1965. Anyone with known risk factors for hepatitis C. Sexually transmitted infections (STIs) You should be screened each year for STIs, including gonorrhea and chlamydia, if: You are sexually active and are younger than 79 years of age. You are older than 79 years of age and your   health care provider tells you that you are at risk for this type of infection. Your sexual activity has changed since you were last screened, and you are at increased risk for chlamydia or gonorrhea. Ask your health care provider if you are at risk. Ask your health care provider about whether you are at high risk for HIV. Your health care provider  may recommend a prescription medicine to help prevent HIV infection. If you choose to take medicine to prevent HIV, you should first get tested for HIV. You should then be tested every 3 months for as long as you are taking the medicine. Follow these instructions at home: Alcohol use Do not drink alcohol if your health care provider tells you not to drink. If you drink alcohol: Limit how much you have to 0-2 drinks a day. Know how much alcohol is in your drink. In the [place] one drink equals one 12 oz bottle of beer (355 mL), one 5 oz glass of wine (148 mL), or one 1 oz glass of hard liquor (44 mL). Lifestyle Do not use any products that contain nicotine or tobacco. These products include cigarettes, chewing tobacco, and vaping devices, such as e-cigarettes. If you need help quitting, ask your health care provider. Do not use street drugs. Do not share needles. Ask your health care provider for help if you need support or information about quitting drugs. General instructions Schedule regular health, dental, and eye exams. Stay current with your vaccines. Tell your health care provider if: You often feel depressed. You have ever been abused or do not feel safe at home. Summary Adopting a healthy lifestyle and getting preventive care are important in promoting health and wellness. Follow your health care provider's instructions about healthy diet, exercising, and getting tested or screened for diseases. Follow your health care provider's instructions on monitoring your cholesterol and blood pressure. This information is not intended to replace advice given to you by your health care provider. Make sure you discuss any questions you have with your health care provider. Document Revised: 01/14/2021 Document Reviewed: 01/14/2021 Elsevier Patient Education  2024 [employer]  

## 2023-03-23 NOTE — Progress Notes (Unsigned)
I,[name], CMA,acting as a Neurosurgeon for [name], MD.,have documented all relevant documentation on the behalf of [name], MD,as directed by  [name], MD while in the presence of [name], MD.  Subjective:   Patient ID: [name] , male    DOB: 1946-03-05 , 77 y.o.   MRN: [id number]  Chief Complaint  Patient presents with   Annual Exam   Hypertension   Hyperlipidemia    HPI  He is here today for a full physical examination. He has no specific concerns or complaints at this time. He is also followed by [facility]. He reports compliance with meds. He denies headaches, chest pain and shortness of breath. He states he plans to travel to [place] soon.   EKG completed on 11/19/2022.  Hypertension This is a chronic problem. The current episode started more than 1 year ago. The problem has been gradually improving since onset. The problem is uncontrolled. Pertinent negatives include no blurred vision, chest pain, palpitations or shortness of breath. Risk factors for coronary artery disease include dyslipidemia, obesity and sedentary lifestyle. The current treatment provides moderate improvement. Compliance problems include exercise.  Hypertensive end-organ damage includes kidney disease.     Past Medical History:  Diagnosis Date   Chronic renal insufficiency    WITH BASELINE CREATININE OF 1.6   Chronic systolic CHF (congestive heart failure) (HCC)    Complete heart block (HCC)    s/p PPM by Dr Reyes Ivan   Coronary disease    NONOBSTRUCTIVE   GERD (gastroesophageal reflux disease)    Gout    Hyperkalemia    Hypertension    Nonischemic cardiomyopathy (HCC)    Presence of permanent cardiac pacemaker      Family History  Problem Relation Age of Onset   Diabetes Father      Current Outpatient Medications:    allopurinol (ZYLOPRIM) 100 MG tablet, TAKE 1 TABLET BY MOUTH EVERY DAY, Disp: 90 tablet, Rfl: 1   Ascorbic Acid (VITAMIN C) 1000 MG  tablet, Take 2,000 mg by mouth daily. , Disp: , Rfl:    aspirin EC 81 MG tablet, Take 81 mg by mouth daily., Disp: , Rfl:    Cholecalciferol (VITAMIN D-3) 1000 UNITS CAPS, Take 2,000 Units by mouth daily. , Disp: , Rfl:    ciclopirox (PENLAC) 8 % solution, Apply topically at bedtime. Apply over nail and surrounding skin. Apply daily over previous coat. After seven (7) days, may remove with alcohol and continue cycle., Disp: 6.6 mL, Rfl: 0   Colchicine 0.6 MG CAPS, Take 0.6 mg by mouth daily as needed (gout)., Disp: , Rfl:    Fexofenadine HCl (ALLEGRA PO), Take 1 tablet by mouth daily as needed (itching)., Disp: , Rfl:    fluticasone-salmeterol (ADVAIR DISKUS) 250-50 MCG/ACT AEPB, Inhale 1 puff twice a day in the morning and evening 12 hours apart, Disp: 180 each, Rfl: 1   furosemide (LASIX) 40 MG tablet, TAKE 1 TABLET BY MOUTH EVERY DAY, Disp: 90 tablet, Rfl: 1   Garlic 1000 MG CAPS, Take 3,000 mg by mouth daily., Disp: , Rfl:    atorvastatin (LIPITOR) 40 MG tablet, Take 1 tablet (40 mg total) by mouth daily. (Patient not taking: Reported on 11/19/2022), Disp: 90 tablet, Rfl: 1   carvedilol (COREG) 12.5 MG tablet, Take 1 tablet (12.5 mg total) by mouth 2 (two) times daily., Disp: 180 tablet, Rfl: 3   hydrALAZINE (APRESOLINE) 25 MG tablet, Take 1 tablet (25 mg  total) by mouth 3 (three) times daily., Disp: 270 tablet, Rfl: 3   Allergies  Allergen Reactions   Avalide [Irbesartan-Hydrochlorothiazide] Anaphylaxis   Penicillins Anaphylaxis    Did it involve swelling of the face/tongue/throat, SOB, or low BP? Yes Did it involve sudden or severe rash/hives, skin peeling, or any reaction on the inside of your mouth or nose? Yes Did you need to seek medical attention at a hospital or doctor's office? Reaction occurred at MD office - penicillin injection When did it last happen?      young adult If all above answers are "NO", may proceed with cephalosporin use.   Tinactin [Tolnaftate] Hives and Rash           Men's preventive visit. Patient Health Questionnaire (PHQ-2) is  Flowsheet Row Office Visit from 03/23/2023 in [facility]  PHQ-2 Total Score 0     . Patient is on a regular diet. Marital status: Divorced. Relevant history for alcohol use is:  Social History   Substance and Sexual Activity  Alcohol Use Yes   Alcohol/week: 7.0 standard drinks of alcohol   Types: 7 Shots of liquor per week  . Relevant history for tobacco use is:  Social History   Tobacco Use  Smoking Status Former   Current packs/day: 1.00   Average packs/day: 1 pack/day for 15.0 years (15.0 ttl pk-yrs)   Types: Cigarettes  Smokeless Tobacco Never  Tobacco Comments   "quit smoking cigarettes in 1985"  .   Review of Systems  Constitutional: Negative.   HENT: Negative.    Eyes:  Negative for blurred vision.  Respiratory: Negative.  Negative for shortness of breath.   Cardiovascular: Negative.  Negative for chest pain and palpitations.  Gastrointestinal: Negative.   Endocrine: Negative.   Musculoskeletal: Negative.   Skin: Negative.   Allergic/Immunologic: Negative.   Neurological: Negative.   Psychiatric/Behavioral: Negative.       Today's Vitals   03/23/23 1022 03/23/23 1106  BP: (!) 160/100 (!) 158/96  Pulse: (!) 54   Temp: 97.7 F (36.5 C)   SpO2: 98%   Weight: 263 lb (119.3 kg)   Height: 5\' 9"  (1.753 m)    Body mass index is 38.84 kg/m.  Wt Readings from Last 3 Encounters:  03/23/23 263 lb (119.3 kg)  11/19/22 264 lb (119.7 kg)  08/25/22 272 lb (123.4 kg)    Objective:  Physical Exam Vitals and nursing note reviewed.  Constitutional:      Appearance: Normal appearance.  HENT:     Head: Normocephalic and atraumatic.     Right Ear: Tympanic membrane, ear canal and external ear normal.     Left Ear: Tympanic membrane, ear canal and external ear normal.     Nose: Nose normal.     Mouth/Throat:     Mouth: Mucous membranes are moist.     Pharynx:  Oropharynx is clear.  Eyes:     Extraocular Movements: Extraocular movements intact.     Conjunctiva/sclera: Conjunctivae normal.     Pupils: Pupils are equal, round, and reactive to light.  Cardiovascular:     Rate and Rhythm: Normal rate and regular rhythm.     Pulses: Normal pulses.     Heart sounds: Normal heart sounds.  Pulmonary:     Effort: Pulmonary effort is normal.     Breath sounds: Normal breath sounds.  Chest:  Breasts:    Right: Normal. No swelling, bleeding, inverted nipple, mass or nipple discharge.  Left: Normal. No swelling, bleeding, inverted nipple, mass or nipple discharge.     Comments: Ppm left anterior chest Abdominal:     General: Bowel sounds are normal.     Palpations: Abdomen is soft.     Comments: Obese, soft. Difficult to assess organomegaly  Genitourinary:    Comments: deferred Musculoskeletal:        General: Normal range of motion.     Cervical back: Normal range of motion and neck supple.     Right lower leg: Edema present.     Left lower leg: Edema present.  Skin:    General: Skin is warm.  Neurological:     General: No focal deficit present.     Mental Status: He is alert.  Psychiatric:        Mood and Affect: Mood normal.        Behavior: Behavior normal.         Assessment And Plan:    Routine general medical examination at health care facility Assessment & Plan: A full exam was performed. Importance of monthly self breast exams was discussed with the patient. PATIENT IS ADVISED TO GET 30-45 MINUTES REGULAR EXERCISE NO LESS THAN FOUR TO FIVE DAYS PER WEEK - BOTH WEIGHTBEARING EXERCISES AND AEROBIC ARE RECOMMENDED.  PATIENT IS ADVISED TO FOLLOW A HEALTHY DIET WITH AT LEAST SIX FRUITS/VEGGIES PER DAY, DECREASE INTAKE OF RED MEAT, AND TO INCREASE FISH INTAKE TO TWO DAYS PER WEEK.  MEATS/FISH SHOULD NOT BE FRIED, BAKED OR BROILED IS PREFERABLE.  IT IS ALSO IMPORTANT TO CUT BACK ON YOUR SUGAR INTAKE. PLEASE AVOID ANYTHING WITH ADDED  SUGAR, CORN SYRUP OR OTHER SWEETENERS. IF YOU MUST USE A SWEETENER, YOU CAN TRY STEVIA. IT IS ALSO IMPORTANT TO AVOID ARTIFICIALLY SWEETENERS AND DIET BEVERAGES. LASTLY, I SUGGEST WEARING SPF 50 SUNSCREEN ON EXPOSED PARTS AND ESPECIALLY WHEN IN THE DIRECT SUNLIGHT FOR AN EXTENDED PERIOD OF TIME.  PLEASE AVOID FAST FOOD RESTAURANTS AND INCREASE YOUR WATER INTAKE.    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 I question his compliance with meds. I will not change any meds at this time. He will continue with furosemide 40mg , carvedilol 12.5mg  twice daily and hydralazine 25mg  tid. He is encouraged to follow a low sodium diet. He will f/u in six weeks.   Orders: -     CMP14+EGFR -     Lipid panel -     CBC -     POCT urinalysis dipstick -     Microalbumin / creatinine urine ratio  Chronic systolic CHF (congestive heart failure) (HCC) Assessment & Plan: Chronic, he is on Bblocker therapy. Importance of dietary/medication compliance was discussed with the patient.    Stage 3a chronic kidney disease (HCC) Assessment & Plan: Chronic, he is encouraged to stay well hydrated, avoid NSAIDs and keep BP controlled to prevent progression of CKD.    Orders: -     POCT urinalysis dipstick -     Microalbumin / creatinine urine ratio -     PTH, intact and calcium -     Phosphorus -     Protein electrophoresis, serum  Pure hypercholesterolemia Assessment & Plan: Chronic, LDL goal < 70.  He will continue with atorvastatin 40mg  daily. Encouraged to follow a heart healthy diet.    Other abnormal glucose Assessment & Plan: Previous labs reviewed, his A1c has been elevated in the past. I will check an A1c today. Reminded to avoid  refined sugars including sugary drinks/foods and processed meats including bacon, sausages and deli meats.    Orders: -     Hemoglobin A1c  Class 2 severe obesity due to excess calories with serious  comorbidity and body mass index (BMI) of 38.0 to 38.9 in adult [facility]) Assessment & Plan: He is encouraged to initially strive for BMI less than 30 to decrease cardiac risk. He is advised to exercise no less than 150 minutes per week.     He is encouraged to strive for BMI less than 30 to decrease cardiac risk. Advised to aim for at least 150 minutes of exercise per week.    Return in 4 weeks (on 04/20/2023), or bp check, for 1 year physical, 6 month bp. Patient was given opportunity to ask questions. Patient verbalized understanding of the plan and was able to repeat key elements of the plan. All questions were answered to their satisfaction.    I, [name], MD, have reviewed all documentation for this visit. The documentation on 03/23/23 for the exam, diagnosis, procedures, and orders are all accurate and complete.

## 2023-03-25 LAB — CMP14+EGFR
ALT: 22 IU/L (ref 0–44)
AST: 23 IU/L (ref 0–40)
Albumin: 4 g/dL (ref 3.8–4.8)
Alkaline Phosphatase: 339 IU/L — ABNORMAL HIGH (ref 44–121)
BUN/Creatinine Ratio: 19 (ref 10–24)
BUN: 31 mg/dL — ABNORMAL HIGH (ref 8–27)
Bilirubin Total: 0.7 mg/dL (ref 0.0–1.2)
CO2: 23 mmol/L (ref 20–29)
Calcium: 9.1 mg/dL (ref 8.6–10.2)
Chloride: 103 mmol/L (ref 96–106)
Creatinine, Ser: 1.6 mg/dL — ABNORMAL HIGH (ref 0.76–1.27)
Globulin, Total: 3.3 g/dL (ref 1.5–4.5)
Glucose: 97 mg/dL (ref 70–99)
Potassium: 5.5 mmol/L — ABNORMAL HIGH (ref 3.5–5.2)
Sodium: 137 mmol/L (ref 134–144)
Total Protein: 7.3 g/dL (ref 6.0–8.5)
eGFR: 44 mL/min/{1.73_m2} — ABNORMAL LOW (ref >59)

## 2023-03-25 LAB — LIPID PANEL
Chol/HDL Ratio: 3.4 ratio (ref 0.0–5.0)
Cholesterol, Total: 209 mg/dL — ABNORMAL HIGH (ref 100–199)
HDL: 62 mg/dL (ref >39)
LDL Chol Calc (NIH): 134 mg/dL — ABNORMAL HIGH (ref 0–99)
Triglycerides: 72 mg/dL (ref 0–149)
VLDL Cholesterol Cal: 13 mg/dL (ref 5–40)

## 2023-03-25 LAB — PROTEIN ELECTROPHORESIS, SERUM
A/G Ratio: 0.9 (ref 0.7–1.7)
Albumin ELP: 3.5 g/dL (ref 2.9–4.4)
Alpha 1: 0.2 g/dL (ref 0.0–0.4)
Alpha 2: 0.5 g/dL (ref 0.4–1.0)
Beta: 1.1 g/dL (ref 0.7–1.3)
Gamma Globulin: 2 g/dL — ABNORMAL HIGH (ref 0.4–1.8)
Globulin, Total: 3.8 g/dL (ref 2.2–3.9)

## 2023-03-25 LAB — CBC
Hematocrit: 41.3 % (ref 37.5–51.0)
Hemoglobin: 12.8 g/dL — ABNORMAL LOW (ref 13.0–17.7)
MCH: 29.4 pg (ref 26.6–33.0)
MCHC: 31 g/dL — ABNORMAL LOW (ref 31.5–35.7)
MCV: 95 fL (ref 79–97)
Platelets: 168 10*3/uL (ref 150–450)
RBC: 4.36 x10E6/uL (ref 4.14–5.80)
RDW: 13.7 % (ref 11.6–15.4)
WBC: 5.3 10*3/uL (ref 3.4–10.8)

## 2023-03-25 LAB — MICROALBUMIN / CREATININE URINE RATIO
Creatinine, Urine: 97.7 mg/dL
Microalb/Creat Ratio: 261 mg/g creat — ABNORMAL HIGH (ref 0–29)
Microalbumin, Urine: 255.1 ug/mL

## 2023-03-25 LAB — PHOSPHORUS: Phosphorus: 3.7 mg/dL (ref 2.8–4.1)

## 2023-03-25 LAB — PTH, INTACT AND CALCIUM: PTH: 72 pg/mL — ABNORMAL HIGH (ref 15–65)

## 2023-03-25 LAB — HEMOGLOBIN A1C
Est. average glucose Bld gHb Est-mCnc: 134 mg/dL
Hgb A1c MFr Bld: 6.3 % — ABNORMAL HIGH (ref 4.8–5.6)

## 2023-04-01 ENCOUNTER — Other Ambulatory Visit: Payer: Self-pay

## 2023-04-01 DIAGNOSIS — E875 Hyperkalemia: Secondary | ICD-10-CM

## 2023-04-02 ENCOUNTER — Ambulatory Visit: Payer: Medicare HMO | Admitting: Cardiology

## 2023-04-07 DIAGNOSIS — Z23 Encounter for immunization: Secondary | ICD-10-CM | POA: Insufficient documentation

## 2023-04-07 DIAGNOSIS — Z0000 Encounter for general adult medical examination without abnormal findings: Secondary | ICD-10-CM | POA: Insufficient documentation

## 2023-04-07 NOTE — Assessment & Plan Note (Signed)
He is encouraged to initially strive for BMI less than 30 to decrease cardiac risk. He is advised to exercise no less than 150 minutes per week.

## 2023-04-07 NOTE — Assessment & Plan Note (Signed)

## 2023-04-07 NOTE — Assessment & Plan Note (Signed)
He was given Tdap, billed via [employer].

## 2023-04-07 NOTE — Assessment & Plan Note (Signed)
Chronic, he is on Bblocker therapy. Importance of dietary/medication compliance was discussed with the patient.

## 2023-04-07 NOTE — Assessment & Plan Note (Signed)
Previous labs reviewed, his A1c has been elevated in the past. I will check an A1c today. Reminded to avoid refined sugars including sugary drinks/foods and processed meats including bacon, sausages and deli meats.     

## 2023-04-07 NOTE — Assessment & Plan Note (Signed)
Chronic, uncontrolled. I question his compliance with meds. I will not change any meds at this time. He will continue with furosemide 40mg , carvedilol 12.5mg  twice daily and hydralazine 25mg  tid. He is encouraged to follow a low sodium diet. He will f/u in six weeks.

## 2023-04-07 NOTE — Assessment & Plan Note (Signed)
Chronic, LDL goal < 70.  He will continue with atorvastatin 40mg  daily. Encouraged to follow a heart healthy diet.

## 2023-04-07 NOTE — Assessment & Plan Note (Signed)
Chronic, he is encouraged to stay well hydrated, avoid NSAIDs and keep BP controlled to prevent progression of CKD.

## 2023-04-10 ENCOUNTER — Ambulatory Visit: Payer: Medicare HMO

## 2023-04-10 VITALS — BP 130/84 | HR 80 | Temp 98.1°F | Ht 69.0 in | Wt 263.0 lb

## 2023-04-10 DIAGNOSIS — I130 Hypertensive heart and chronic kidney disease with heart failure and stage 1 through stage 4 chronic kidney disease, or unspecified chronic kidney disease: Secondary | ICD-10-CM

## 2023-04-10 NOTE — Progress Notes (Unsigned)
Patient presents today for bpc. He currently takes Carvedilol 12.5mg  twice a day & Hydralazine25mg  TID. Denies headache, chest pain, sob. He reports taking medications before appointment today. He reports exercising regularly.  BP Readings from Last 3 Encounters:  04/10/23 130/84  03/23/23 (!) 158/96  11/19/22 128/72  Per provider patient is to continue with current regimen. Patient aware of upcoming scheduled appointment.

## 2023-04-10 NOTE — Patient Instructions (Signed)
Hypertension, Adult Hypertension is another name for high blood pressure. High blood pressure forces your heart to work harder to pump blood. This can cause problems over time. There are two numbers in a blood pressure reading. There is a top number (systolic) over a bottom number (diastolic). It is best to have a blood pressure that is below 120/80. What are the causes? The cause of this condition is not known. Some other conditions can lead to high blood pressure. What increases the risk? Some lifestyle factors can make you more likely to develop high blood pressure: Smoking. Not getting enough exercise or physical activity. Being overweight. Having too much fat, sugar, calories, or salt (sodium) in your diet. Drinking too much alcohol. Other risk factors include: Having any of these conditions: Heart disease. Diabetes. High cholesterol. Kidney disease. Obstructive sleep apnea. Having a family history of high blood pressure and high cholesterol. Age. The risk increases with age. Stress. What are the signs or symptoms? High blood pressure may not cause symptoms. Very high blood pressure (hypertensive crisis) may cause: Headache. Fast or uneven heartbeats (palpitations). Shortness of breath. Nosebleed. Vomiting or feeling like you may vomit (nauseous). Changes in how you see. Very bad chest pain. Feeling dizzy. Seizures. How is this treated? This condition is treated by making healthy lifestyle changes, such as: Eating healthy foods. Exercising more. Drinking less alcohol. Your doctor may prescribe medicine if lifestyle changes do not help enough and if: Your top number is above 130. Your bottom number is above 80. Your personal target blood pressure may vary. Follow these instructions at home: Eating and drinking  If told, follow the DASH eating plan. To follow this plan: Fill one half of your plate at each meal with fruits and vegetables. Fill one fourth of your plate  at each meal with whole grains. Whole grains include whole-wheat pasta, brown rice, and whole-grain bread. Eat or drink low-fat dairy products, such as skim milk or low-fat yogurt. Fill one fourth of your plate at each meal with low-fat (lean) proteins. Low-fat proteins include fish, chicken without skin, eggs, beans, and tofu. Avoid fatty meat, cured and processed meat, or chicken with skin. Avoid pre-made or processed food. Limit the amount of salt in your diet to less than 1,500 mg each day. Do not drink alcohol if: Your doctor tells you not to drink. You are pregnant, may be pregnant, or are planning to become pregnant. If you drink alcohol: Limit how much you have to: 0-1 drink a day for women. 0-2 drinks a day for men. Know how much alcohol is in your drink. In the [place] one drink equals one 12 oz bottle of beer (355 mL), one 5 oz glass of wine (148 mL), or one 1 oz glass of hard liquor (44 mL). Lifestyle  Work with your doctor to stay at a healthy weight or to lose weight. Ask your doctor what the best weight is for you. Get at least 30 minutes of exercise that causes your heart to beat faster (aerobic exercise) most days of the week. This may include walking, swimming, or biking. Get at least 30 minutes of exercise that strengthens your muscles (resistance exercise) at least 3 days a week. This may include lifting weights or doing Pilates. Do not smoke or use any products that contain nicotine or tobacco. If you need help quitting, ask your doctor. Check your blood pressure at home as told by your doctor. Keep all follow-up visits. Medicines Take over-the-counter and prescription medicines   only as told by your doctor. Follow directions carefully. Do not skip doses of blood pressure medicine. The medicine does not work as well if you skip doses. Skipping doses also puts you at risk for problems. Ask your doctor about side effects or reactions to medicines that you should watch  for. Contact a doctor if: You think you are having a reaction to the medicine you are taking. You have headaches that keep coming back. You feel dizzy. You have swelling in your ankles. You have trouble with your vision. Get help right away if: You get a very bad headache. You start to feel mixed up (confused). You feel weak or numb. You feel faint. You have very bad pain in your: Chest. Belly (abdomen). You vomit more than once. You have trouble breathing. These symptoms may be an emergency. Get help right away. Call 911. Do not wait to see if the symptoms will go away. Do not drive yourself to the hospital. Summary Hypertension is another name for high blood pressure. High blood pressure forces your heart to work harder to pump blood. For most people, a normal blood pressure is less than 120/80. Making healthy choices can help lower blood pressure. If your blood pressure does not get lower with healthy choices, you may need to take medicine. This information is not intended to replace advice given to you by your health care provider. Make sure you discuss any questions you have with your health care provider. Document Revised: 06/13/2021 Document Reviewed: 06/13/2021 Elsevier Patient Education  2024 [employer]  

## 2023-04-18 ENCOUNTER — Other Ambulatory Visit: Payer: Self-pay | Admitting: Internal Medicine

## 2023-05-06 ENCOUNTER — Ambulatory Visit: Payer: Medicare HMO | Admitting: Internal Medicine

## 2023-07-30 ENCOUNTER — Encounter: Payer: Self-pay | Admitting: Internal Medicine

## 2023-07-30 ENCOUNTER — Ambulatory Visit (INDEPENDENT_AMBULATORY_CARE_PROVIDER_SITE_OTHER): Payer: Medicare HMO | Admitting: Internal Medicine

## 2023-07-30 ENCOUNTER — Ambulatory Visit: Payer: Medicare HMO

## 2023-07-30 VITALS — BP 150/90 | HR 77 | Temp 97.4°F | Ht 66.0 in | Wt 266.0 lb

## 2023-07-30 VITALS — BP 150/90 | HR 77 | Temp 97.4°F | Ht 66.6 in | Wt 266.6 lb

## 2023-07-30 DIAGNOSIS — Z6841 Body Mass Index (BMI) 40.0 and over, adult: Secondary | ICD-10-CM

## 2023-07-30 DIAGNOSIS — I5022 Chronic systolic (congestive) heart failure: Secondary | ICD-10-CM | POA: Diagnosis not present

## 2023-07-30 DIAGNOSIS — E66813 Obesity, class 3: Secondary | ICD-10-CM

## 2023-07-30 DIAGNOSIS — I130 Hypertensive heart and chronic kidney disease with heart failure and stage 1 through stage 4 chronic kidney disease, or unspecified chronic kidney disease: Secondary | ICD-10-CM

## 2023-07-30 DIAGNOSIS — Z0000 Encounter for general adult medical examination without abnormal findings: Secondary | ICD-10-CM

## 2023-07-30 DIAGNOSIS — N1831 Chronic kidney disease, stage 3a: Secondary | ICD-10-CM | POA: Diagnosis not present

## 2023-07-30 DIAGNOSIS — E7800 Pure hypercholesterolemia, unspecified: Secondary | ICD-10-CM | POA: Diagnosis not present

## 2023-07-30 DIAGNOSIS — M1A39X0 Chronic gout due to renal impairment, multiple sites, without tophus (tophi): Secondary | ICD-10-CM

## 2023-07-30 DIAGNOSIS — Z91199 Patient's noncompliance with other medical treatment and regimen due to unspecified reason: Secondary | ICD-10-CM

## 2023-07-30 DIAGNOSIS — Z5320 Procedure and treatment not carried out because of patient's decision for unspecified reasons: Secondary | ICD-10-CM

## 2023-07-30 DIAGNOSIS — R7309 Other abnormal glucose: Secondary | ICD-10-CM

## 2023-07-30 NOTE — Progress Notes (Signed)
Subjective:   [name] is a 77 y.o. male who presents for Medicare Annual/Subsequent preventive examination.  Visit Complete: In person    Cardiac Risk Factors include: advanced age (>76men, >82 women);hypertension;male gender     Objective:    Today's Vitals   07/30/23 1101 07/30/23 1114  BP: (!) 160/90 (!) 150/90  Pulse: 77   Temp: (!) 97.4 F (36.3 C)   TempSrc: Oral   SpO2: 99%   Weight: 266 lb 9.6 oz (120.9 kg)   Height: 5' 6.6" (1.692 m)    Body mass index is 42.26 kg/m.     07/30/2023   11:07 AM 07/09/2022   12:00 PM 07/04/2021    9:01 AM 06/28/2020    9:56 AM 09/21/2019   12:00 PM 08/17/2019    9:48 AM 07/10/2017    3:23 PM  Advanced Directives  Does Patient Have a Medical Advance Directive? Yes No No No Yes No No  Type of Estate agent of [place];Living will    Living will;Healthcare Power of Attorney    Copy of Healthcare Power of Attorney in Chart? No - copy requested    No - copy requested    Would patient like information on creating a medical advance directive?  No - Patient declined    No - Patient declined No - Patient declined    Current Medications (verified) Outpatient Encounter Medications as of 07/30/2023  Medication Sig   allopurinol (ZYLOPRIM) 100 MG tablet TAKE 1 TABLET BY MOUTH EVERY DAY   Ascorbic Acid (VITAMIN C) 1000 MG tablet Take 2,000 mg by mouth daily.    aspirin EC 81 MG tablet Take 81 mg by mouth daily.   Cholecalciferol (VITAMIN D-3) 1000 UNITS CAPS Take 2,000 Units by mouth daily.    Colchicine 0.6 MG CAPS Take 0.6 mg by mouth daily as needed (gout).   Fexofenadine HCl (ALLEGRA PO) Take 1 tablet by mouth daily as needed (itching).   fluticasone-salmeterol (ADVAIR DISKUS) 250-50 MCG/ACT AEPB Inhale 1 puff twice a day in the morning and evening 12 hours apart   furosemide (LASIX) 40 MG tablet TAKE 1 TABLET BY MOUTH EVERY DAY   Garlic 1000 MG CAPS Take 3,000 mg by mouth daily.   atorvastatin (LIPITOR)  40 MG tablet Take 1 tablet (40 mg total) by mouth daily. (Patient not taking: Reported on 11/19/2022)   carvedilol (COREG) 12.5 MG tablet Take 1 tablet (12.5 mg total) by mouth 2 (two) times daily.   ciclopirox (PENLAC) 8 % solution Apply topically at bedtime. Apply over nail and surrounding skin. Apply daily over previous coat. After seven (7) days, may remove with alcohol and continue cycle.   hydrALAZINE (APRESOLINE) 25 MG tablet Take 1 tablet (25 mg total) by mouth 3 (three) times daily.   No facility-administered encounter medications on file as of 07/30/2023.    Allergies (verified) Avalide [irbesartan-hydrochlorothiazide], Penicillins, and Tinactin [tolnaftate]   History: Past Medical History:  Diagnosis Date   Chronic renal insufficiency    WITH BASELINE CREATININE OF 1.6   Chronic systolic CHF (congestive heart failure) (HCC)    Complete heart block (HCC)    s/p PPM by Dr Reyes Ivan   Coronary disease    NONOBSTRUCTIVE   GERD (gastroesophageal reflux disease)    Gout    Hyperkalemia    Hypertension    Nonischemic cardiomyopathy (HCC)    Presence of permanent cardiac pacemaker    Past Surgical History:  Procedure Laterality Date   CARDIAC CATHETERIZATION  08/2013   EP IMPLANTABLE DEVICE N/A 02/15/2015   Procedure: Lead Revision/Repair;  Surgeon: Hillis Range, MD;  Location: [facility] INVASIVE CV [facility];  Service: Cardiovascular;  Laterality: N/A;   INGUINAL HERNIA REPAIR Bilateral    INSERT / REPLACE / REMOVE PACEMAKER  05/20/2009   MDT by Dr Reyes Ivan for complete heart block   LEFT AND RIGHT HEART CATHETERIZATION WITH CORONARY ANGIOGRAM N/A 08/16/2013   Procedure: LEFT AND RIGHT HEART CATHETERIZATION WITH CORONARY ANGIOGRAM;  Surgeon: [name] [place], MD;  Location: [facility] CATH LAB;  Service: Cardiovascular;  Laterality: N/A;   Family History  Problem Relation Age of Onset   Diabetes Father    Social History   Socioeconomic History   Marital status: Divorced    Spouse name: Not on file    Number of children: 1   Years of education: 14   Highest education level: Not on file  Occupational History   Occupation: Sports administrator: Biomedical scientist   Occupation: retired  Tobacco Use   Smoking status: Former    Current packs/day: 1.00    Average packs/day: 1 pack/day for 15.0 years (15.0 ttl pk-yrs)    Types: Cigarettes   Smokeless tobacco: Never   Tobacco comments:    "quit smoking cigarettes in 1985"  Vaping Use   Vaping status: Never Used  Substance and Sexual Activity   Alcohol use: Yes    Alcohol/week: 7.0 standard drinks of alcohol    Types: 7 Shots of liquor per week   Drug use: No   Sexual activity: Not Currently  Other Topics Concern   Not on file  Social History Narrative   Lives in [place].   Drinks a couple of coffees a day   Lives at home alone with his dog   Social Determinants of Health   Financial Resource Strain: Low Risk  (07/30/2023)   Overall Financial Resource Strain (CARDIA)    Difficulty of Paying Living Expenses: Not hard at all  Food Insecurity: No Food Insecurity (07/30/2023)   Hunger Vital Sign    Worried About Running Out of Food in the Last Year: Never true    Ran Out of Food in the Last Year: Never true  Transportation Needs: No Transportation Needs (07/30/2023)   PRAPARE - Administrator, Civil Service (Medical): No    Lack of Transportation (Non-Medical): No  Physical Activity: Sufficiently Active (07/30/2023)   Exercise Vital Sign    Days of Exercise per Week: 5 days    Minutes of Exercise per Session: 150+ min  Stress: No Stress Concern Present (07/30/2023)   [employer] of Occupational Health - Occupational Stress Questionnaire    Feeling of Stress : Not at all  Social Connections: Moderately Integrated (07/30/2023)   Social Connection and Isolation Panel [NHANES]    Frequency of Communication with Friends and Family: More than three times a week    Frequency of Social Gatherings with Friends and  Family: More than three times a week    Attends Religious Services: More than 4 times per year    Active Member of [employer] or Organizations: Yes    Attends Banker Meetings: More than 4 times per year    Marital Status: Widowed    Tobacco Counseling Counseling given: Not Answered Tobacco comments: "quit smoking cigarettes in 1985"   Clinical Intake:  Pre-visit preparation completed: Yes  Pain : No/denies pain     Nutritional Status: BMI > 30  Obese Nutritional Risks: None  Diabetes: No  How often do you need to have someone help you when you read instructions, pamphlets, or other written materials from your doctor or pharmacy?: 1 - Never  Interpreter Needed?: No  Information entered by :: NAllen LPN   Activities of Daily Living    07/30/2023   11:02 AM  In your present state of health, do you have any difficulty performing the following activities:  Hearing? 1  Comment has hearing aids  Vision? 0  Difficulty concentrating or making decisions? 0  Walking or climbing stairs? 0  Dressing or bathing? 0  Doing errands, shopping? 0  Preparing Food and eating ? N  Using the Toilet? N  In the past six months, have you accidently leaked urine? N  Do you have problems with loss of bowel control? N  Managing your Medications? N  Managing your Finances? N  Housekeeping or managing your Housekeeping? N    Patient Care Team: Dorothyann Peng, MD as PCP - General (Internal Medicine)  Indicate any recent Medical Services you may have received from other than [facility] providers in the past year (date may be approximate).     Assessment:   This is a routine wellness examination for [name].  Hearing/Vision screen Hearing Screening - Comments:: Has hearing aids Vision Screening - Comments:: [facility]   Goals Addressed             This Visit's Progress    Patient Stated       07/30/2023, wants to lose 100 pounds       Depression Screen     07/30/2023   11:08 AM 03/23/2023   10:30 AM 07/09/2022   12:01 PM 02/20/2022    8:39 AM 07/04/2021    9:01 AM 06/28/2020    9:57 AM 08/17/2019    9:50 AM  PHQ 2/9 Scores  PHQ - 2 Score 0 0 0 3 0 0 0  PHQ- 9 Score  0  4   0    Fall Risk    07/30/2023   11:07 AM 03/23/2023   10:30 AM 07/09/2022   12:00 PM 02/20/2022    8:38 AM 07/04/2021    9:01 AM  Fall Risk   Falls in the past year? 0 0 0 0 0  Number falls in past yr: 0 0 0 0   Injury with Fall? 0 0 0 0   Risk for fall due to : Medication side effect No Fall Risks Mental status change No Fall Risks Medication side effect  Follow up Falls prevention discussed;Falls evaluation completed Falls evaluation completed Falls prevention discussed;Education provided;Falls evaluation completed Falls evaluation completed Falls evaluation completed;Education provided;Falls prevention discussed    MEDICARE RISK AT HOME: Medicare Risk at Home Any stairs in or around the home?: No If so, are there any without handrails?: No Home free of loose throw rugs in walkways, pet beds, electrical cords, etc?: Yes Adequate lighting in your home to reduce risk of falls?: Yes Life alert?: No Use of a cane, walker or w/c?: No Grab bars in the bathroom?: No Shower chair or bench in shower?: No Elevated toilet seat or a handicapped toilet?: No  TIMED UP AND GO:  Was the test performed?  Yes  Length of time to ambulate 10 feet: 5 sec Gait steady and fast without use of assistive device    Cognitive Function:        07/30/2023   11:08 AM 07/09/2022   12:02 PM 06/28/2020  9:59 AM 08/17/2019    9:54 AM  6CIT Screen  What Year? 0 points 0 points 0 points 0 points  What month? 0 points 0 points 0 points 0 points  What time? 0 points 0 points 0 points 0 points  Count back from 20 0 points 2 points 0 points 0 points  Months in reverse 2 points 2 points 4 points 4 points  Repeat phrase 0 points 0 points 0 points 0 points  Total Score 2 points 4 points  4 points 4 points    Immunizations Immunization History  Administered Date(s) Administered   Moderna Covid-19 Fall Seasonal Vaccine 101yrs & older 10/09/2022, 06/10/2023   Moderna SARS-COV2 Booster Vaccination 04/02/2021   Moderna Sars-Covid-2 Vaccination 11/14/2019, 12/13/2019, 07/23/2020   PNEUMOCOCCAL CONJUGATE-20 08/25/2022   Pneumococcal Polysaccharide-23 02/11/2021   Zoster Recombinant(Shingrix) 09/17/2021    TDAP status: Up to date  Flu Vaccine status: Declined, Education has been provided regarding the importance of this vaccine but patient still declined. Advised may receive this vaccine at local pharmacy or Health Dept. Aware to provide a copy of the vaccination record if obtained from local pharmacy or Health Dept. Verbalized acceptance and understanding.  Pneumococcal vaccine status: Up to date  Covid-19 vaccine status: Completed vaccines  Qualifies for Shingles Vaccine? Yes   Zostavax completed Yes   Shingrix Completed?: Yes  Screening Tests Health Maintenance  Topic Date Due   DTaP/Tdap/Td (1 - Tdap) Never done   INFLUENZA VACCINE  12/07/2023 (Originally 04/09/2023)   Medicare Annual Wellness (AWV)  07/29/2024   Pneumonia Vaccine 71+ Years old  Completed   COVID-19 Vaccine  Completed   Hepatitis C Screening  Completed   Zoster Vaccines- Shingrix  Completed   HPV VACCINES  Aged Out    Health Maintenance  Health Maintenance Due  Topic Date Due   DTaP/Tdap/Td (1 - Tdap) Never done    Colorectal cancer screening: No longer required.   Lung Cancer Screening: (Low Dose CT Chest recommended if Age 24-80 years, 20 pack-year currently smoking OR have quit w/in 15years.) does not qualify.   Lung Cancer Screening Referral: no  Additional Screening:  Hepatitis C Screening: does qualify; Completed 08/10/2018  Vision Screening: Recommended annual ophthalmology exams for early detection of glaucoma and other disorders of the eye. Is the patient up to date with  their annual eye exam?  Yes  Who is the provider or what is the name of the office in which the patient attends annual eye exams? [facility] If pt is not established with a provider, would they like to be referred to a provider to establish care? No .   Dental Screening: Recommended annual dental exams for proper oral hygiene  Diabetic Foot Exam: n/a  Community Resource Referral / Chronic Care Management: CRR required this visit?  No   CCM required this visit?  No     Plan:     I have personally reviewed and noted the following in the patient's chart:   Medical and social history Use of alcohol, tobacco or illicit drugs  Current medications and supplements including opioid prescriptions. Patient is not currently taking opioid prescriptions. Functional ability and status Nutritional status Physical activity Advanced directives List of other physicians Hospitalizations, surgeries, and ER visits in previous 12 months Vitals Screenings to include cognitive, depression, and falls Referrals and appointments  In addition, I have reviewed and discussed with patient certain preventive protocols, quality metrics, and best practice recommendations. A written personalized care plan for preventive services  as well as general preventive health recommendations were provided to patient.     Barb Merino, LPN   [id number]   After Visit Summary: (In Person-Printed) AVS printed and given to the patient  Nurse Notes: none

## 2023-07-30 NOTE — Patient Instructions (Signed)
[name] , Thank you for taking time to come for your Medicare Wellness Visit. I appreciate your ongoing commitment to your health goals. Please review the following plan we discussed and let me know if I can assist you in the future.   Referrals/Orders/Follow-Ups/Clinician Recommendations: none  This is a list of the screening recommended for you and due dates:  Health Maintenance  Topic Date Due   DTaP/Tdap/Td vaccine (1 - Tdap) Never done   Flu Shot  12/07/2023*   Medicare Annual Wellness Visit  07/29/2024   Pneumonia Vaccine  Completed   COVID-19 Vaccine  Completed   Hepatitis C Screening  Completed   Zoster (Shingles) Vaccine  Completed   HPV Vaccine  Aged Out  *Topic was postponed. The date shown is not the original due date.    Advanced directives: (Copy Requested) Please bring a copy of your health care power of attorney and living will to the office to be added to your chart at your convenience.  Next Medicare Annual Wellness Visit scheduled for next year: Yes  insert Preventive Care attachment Insert FALL PREVENTION attachment if needed

## 2023-07-30 NOTE — Progress Notes (Signed)
I,[name], CMA,acting as a Neurosurgeon for Gregory Aliment, MD.,have documented all relevant documentation on the behalf of Gregory Aliment, MD,as directed by  Gregory Aliment, MD while in the presence of Gregory Aliment, MD.  Subjective:  Patient ID: [name] , male    DOB: [birth year] , 77 y.o.   MRN: [id number]  Chief Complaint  Patient presents with   Hypertension   Hyperlipidemia    HPI  Patient presents today for a bp & cholesterol check.  He reports compliance with meds. He denies headaches, chest pain and shortness of breath.  AWV completed with [facility] Advisor: [name].   Hypertension This is a chronic problem. The current episode started more than 1 year ago. The problem has been gradually improving since onset. The problem is controlled. Pertinent negatives include no blurred vision, chest pain, headaches or shortness of breath. Risk factors for coronary artery disease include dyslipidemia, male gender and obesity. Compliance problems include diet.  Identifiable causes of hypertension include chronic renal disease.  Hyperlipidemia Exacerbating diseases include chronic renal disease. Pertinent negatives include no chest pain or shortness of breath.     Past Medical History:  Diagnosis Date   Chronic renal insufficiency    WITH BASELINE CREATININE OF 1.6   Chronic systolic CHF (congestive heart failure) (HCC)    Complete heart block (HCC)    s/p PPM by Dr [name]   Coronary disease    NONOBSTRUCTIVE   GERD (gastroesophageal reflux disease)    Gout    Hyperkalemia    Hypertension    Nonischemic cardiomyopathy (HCC)    Presence of permanent cardiac pacemaker      Family History  Problem Relation Age of Onset   Diabetes Father      Current Outpatient Medications:    allopurinol (ZYLOPRIM) 100 MG tablet, TAKE 1 TABLET BY MOUTH EVERY DAY, Disp: 90 tablet, Rfl: 1   Ascorbic Acid (VITAMIN C) 1000 MG tablet, Take 2,000 mg by mouth daily. , Disp: , Rfl:     aspirin EC 81 MG tablet, Take 81 mg by mouth daily., Disp: , Rfl:    carvedilol (COREG) 12.5 MG tablet, Take 1 tablet (12.5 mg total) by mouth 2 (two) times daily., Disp: 180 tablet, Rfl: 3   Cholecalciferol (VITAMIN D-3) 1000 UNITS CAPS, Take 2,000 Units by mouth daily. , Disp: , Rfl:    ciclopirox (PENLAC) 8 % solution, Apply topically at bedtime. Apply over nail and surrounding skin. Apply daily over previous coat. After seven (7) days, may remove with alcohol and continue cycle., Disp: 6.6 mL, Rfl: 0   Colchicine 0.6 MG CAPS, Take 0.6 mg by mouth daily as needed (gout)., Disp: , Rfl:    Fexofenadine HCl (ALLEGRA PO), Take 1 tablet by mouth daily as needed (itching)., Disp: , Rfl:    fluticasone-salmeterol (ADVAIR DISKUS) 250-50 MCG/ACT AEPB, Inhale 1 puff twice a day in the morning and evening 12 hours apart, Disp: 180 each, Rfl: 1   furosemide (LASIX) 40 MG tablet, TAKE 1 TABLET BY MOUTH EVERY DAY, Disp: 90 tablet, Rfl: 1   Garlic 1000 MG CAPS, Take 3,000 mg by mouth daily., Disp: , Rfl:    hydrALAZINE (APRESOLINE) 25 MG tablet, Take 1 tablet (25 mg total) by mouth 3 (three) times daily., Disp: 270 tablet, Rfl: 3   Allergies  Allergen Reactions   Avalide [Irbesartan-Hydrochlorothiazide] Anaphylaxis   Penicillins Anaphylaxis    Did it involve swelling of the face/tongue/throat, SOB, or low BP?  Yes Did it involve sudden or severe rash/hives, skin peeling, or any reaction on the inside of your mouth or nose? Yes Did you need to seek medical attention at a hospital or doctor's office? Reaction occurred at MD office - penicillin injection When did it last happen?      young adult If all above answers are "NO", may proceed with cephalosporin use.   Tinactin [Tolnaftate] Hives and Rash          Review of Systems  Constitutional: Negative.   HENT: Negative.    Eyes:  Negative for blurred vision.  Respiratory: Negative.  Negative for shortness of breath.   Cardiovascular: Negative.   Negative for chest pain.  Gastrointestinal: Negative.   Skin: Negative.   Allergic/Immunologic: Negative.   Neurological: Negative.  Negative for headaches.  Hematological: Negative.      Today's Vitals   07/30/23 1107 07/30/23 1130  BP: (!) 160/90 (!) 150/90  Pulse: 77   Temp: (!) 97.4 F (36.3 C)   SpO2: 98%   Weight: 266 lb (120.7 kg)   Height: 5\' 6"  (1.676 m)    Body mass index is 42.93 kg/m.  Wt Readings from Last 3 Encounters:  07/30/23 266 lb (120.7 kg)  07/30/23 266 lb 9.6 oz (120.9 kg)  04/10/23 263 lb (119.3 kg)     Objective:  Physical Exam Vitals and nursing note reviewed.  Constitutional:      Appearance: Normal appearance. He is obese.  HENT:     Head: Normocephalic and atraumatic.  Eyes:     Extraocular Movements: Extraocular movements intact.  Cardiovascular:     Rate and Rhythm: Normal rate and regular rhythm.     Heart sounds: Normal heart sounds.  Pulmonary:     Effort: Pulmonary effort is normal.     Breath sounds: Normal breath sounds.  Musculoskeletal:     Cervical back: Normal range of motion.  Skin:    General: Skin is warm.  Neurological:     General: No focal deficit present.     Mental Status: He is alert.  Psychiatric:        Mood and Affect: Mood normal.         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 He admits to non-compliance with is medsI question his compliance with meds. I will not change any meds at this time. He will continue with furosemide 40mg , carvedilol 12.5mg  twice daily and hydralazine 25mg  tid. He is encouraged to follow a low sodium diet.  Of note, he is also folllowed by the [place].   Orders: -     BMP8+EGFR -     TSH  Chronic systolic CHF (congestive heart failure) (HCC) Assessment & Plan: Chronic, he is on Bblocker therapy and diuretic therapy. He admits non-compliance with meds. . Importance of  dietary/medication compliance was discussed with the patient.    Stage 3a chronic kidney disease (HCC) Assessment & Plan: Chronic, he is encouraged to stay well hydrated, avoid NSAIDs and keep BP controlled to prevent progression of CKD.  Nephrology input is appreciated.   Orders: -     PTH, intact and calcium -     Phosphorus -     Protein electrophoresis, serum  Pure hypercholesterolemia Assessment & Plan: Chronic, LDL goal < 70.  He has been prescribed atorvastatin 40mg  daily. He admits non-compliance with meds. He is encouraged to follow a heart healthy diet.  Orders: -     TSH  Chronic gout due to renal impairment of multiple sites without tophus Assessment & Plan: Chronic, I will check uric acid level. He is encouraged to avoid known triggers.   Orders: -     Uric acid  Other abnormal glucose Assessment & Plan: Previous labs reviewed, his A1c has been elevated in the past. I will check an A1c today. Reminded to avoid refined sugars including sugary drinks/foods and processed meats including bacon, sausages and deli meats.    Orders: -     Hemoglobin A1c  Class 3 severe obesity due to excess calories with serious comorbidity and body mass index (BMI) of 40.0 to 44.9 in adult [facility]) Assessment & Plan: He is encouraged to initially strive for BMI less than 30 to decrease cardiac risk. He is advised to exercise no less than 150 minutes per week.     Noncompliance  Refusal of statin medication by patient  He is encouraged to strive for BMI less than 30 to decrease cardiac risk. Advised to aim for at least 150 minutes of exercise per week.    Return Cancel January appt, for 1 year AWV w thn, bp check with RS same day.  Patient was given opportunity to ask questions. Patient verbalized understanding of the plan and was able to repeat key elements of the plan. All questions were answered to their satisfaction.   I, Gregory Aliment, MD, have reviewed all documentation  for this visit. The documentation on 08/06/23 for the exam, diagnosis, procedures, and orders are all accurate and complete.   IF YOU HAVE BEEN REFERRED TO A SPECIALIST, IT MAY TAKE 1-2 WEEKS TO SCHEDULE/PROCESS THE REFERRAL. IF YOU HAVE NOT HEARD FROM US/SPECIALIST IN TWO WEEKS, PLEASE GIVE [place] A CALL AT [phone].   THE PATIENT IS ENCOURAGED TO PRACTICE SOCIAL DISTANCING DUE TO THE COVID-19 PANDEMIC.

## 2023-07-30 NOTE — Patient Instructions (Signed)
Hypertension, Adult Hypertension is another name for high blood pressure. High blood pressure forces your heart to work harder to pump blood. This can cause problems over time. There are two numbers in a blood pressure reading. There is a top number (systolic) over a bottom number (diastolic). It is best to have a blood pressure that is below 120/80. What are the causes? The cause of this condition is not known. Some other conditions can lead to high blood pressure. What increases the risk? Some lifestyle factors can make you more likely to develop high blood pressure: Smoking. Not getting enough exercise or physical activity. Being overweight. Having too much fat, sugar, calories, or salt (sodium) in your diet. Drinking too much alcohol. Other risk factors include: Having any of these conditions: Heart disease. Diabetes. High cholesterol. Kidney disease. Obstructive sleep apnea. Having a family history of high blood pressure and high cholesterol. Age. The risk increases with age. Stress. What are the signs or symptoms? High blood pressure may not cause symptoms. Very high blood pressure (hypertensive crisis) may cause: Headache. Fast or uneven heartbeats (palpitations). Shortness of breath. Nosebleed. Vomiting or feeling like you may vomit (nauseous). Changes in how you see. Very bad chest pain. Feeling dizzy. Seizures. How is this treated? This condition is treated by making healthy lifestyle changes, such as: Eating healthy foods. Exercising more. Drinking less alcohol. Your doctor may prescribe medicine if lifestyle changes do not help enough and if: Your top number is above 130. Your bottom number is above 80. Your personal target blood pressure may vary. Follow these instructions at home: Eating and drinking  If told, follow the DASH eating plan. To follow this plan: Fill one half of your plate at each meal with fruits and vegetables. Fill one fourth of your plate  at each meal with whole grains. Whole grains include whole-wheat pasta, brown rice, and whole-grain bread. Eat or drink low-fat dairy products, such as skim milk or low-fat yogurt. Fill one fourth of your plate at each meal with low-fat (lean) proteins. Low-fat proteins include fish, chicken without skin, eggs, beans, and tofu. Avoid fatty meat, cured and processed meat, or chicken with skin. Avoid pre-made or processed food. Limit the amount of salt in your diet to less than 1,500 mg each day. Do not drink alcohol if: Your doctor tells you not to drink. You are pregnant, may be pregnant, or are planning to become pregnant. If you drink alcohol: Limit how much you have to: 0-1 drink a day for women. 0-2 drinks a day for men. Know how much alcohol is in your drink. In the [place] one drink equals one 12 oz bottle of beer (355 mL), one 5 oz glass of wine (148 mL), or one 1 oz glass of hard liquor (44 mL). Lifestyle  Work with your doctor to stay at a healthy weight or to lose weight. Ask your doctor what the best weight is for you. Get at least 30 minutes of exercise that causes your heart to beat faster (aerobic exercise) most days of the week. This may include walking, swimming, or biking. Get at least 30 minutes of exercise that strengthens your muscles (resistance exercise) at least 3 days a week. This may include lifting weights or doing Pilates. Do not smoke or use any products that contain nicotine or tobacco. If you need help quitting, ask your doctor. Check your blood pressure at home as told by your doctor. Keep all follow-up visits. Medicines Take over-the-counter and prescription medicines   only as told by your doctor. Follow directions carefully. Do not skip doses of blood pressure medicine. The medicine does not work as well if you skip doses. Skipping doses also puts you at risk for problems. Ask your doctor about side effects or reactions to medicines that you should watch  for. Contact a doctor if: You think you are having a reaction to the medicine you are taking. You have headaches that keep coming back. You feel dizzy. You have swelling in your ankles. You have trouble with your vision. Get help right away if: You get a very bad headache. You start to feel mixed up (confused). You feel weak or numb. You feel faint. You have very bad pain in your: Chest. Belly (abdomen). You vomit more than once. You have trouble breathing. These symptoms may be an emergency. Get help right away. Call 911. Do not wait to see if the symptoms will go away. Do not drive yourself to the hospital. Summary Hypertension is another name for high blood pressure. High blood pressure forces your heart to work harder to pump blood. For most people, a normal blood pressure is less than 120/80. Making healthy choices can help lower blood pressure. If your blood pressure does not get lower with healthy choices, you may need to take medicine. This information is not intended to replace advice given to you by your health care provider. Make sure you discuss any questions you have with your health care provider. Document Revised: 06/13/2021 Document Reviewed: 06/13/2021 Elsevier Patient Education  2024 [employer]  

## 2023-08-06 NOTE — Assessment & Plan Note (Signed)
He is encouraged to initially strive for BMI less than 30 to decrease cardiac risk. He is advised to exercise no less than 150 minutes per week.

## 2023-08-06 NOTE — Assessment & Plan Note (Signed)
Chronic, LDL goal < 70.  He has been prescribed atorvastatin 40mg  daily. He admits non-compliance with meds. He is encouraged to follow a heart healthy diet.

## 2023-08-06 NOTE — Assessment & Plan Note (Signed)
Chronic, I will check uric acid level. He is encouraged to avoid known triggers.

## 2023-08-06 NOTE — Assessment & Plan Note (Signed)
Chronic, uncontrolled. He admits to non-compliance with is medsI question his compliance with meds. I will not change any meds at this time. He will continue with furosemide 40mg , carvedilol 12.5mg  twice daily and hydralazine 25mg  tid. He is encouraged to follow a low sodium diet.  Of note, he is also folllowed by the [place].

## 2023-08-06 NOTE — Assessment & Plan Note (Signed)
Previous labs reviewed, his A1c has been elevated in the past. I will check an A1c today. Reminded to avoid refined sugars including sugary drinks/foods and processed meats including bacon, sausages and deli meats.     

## 2023-08-06 NOTE — Assessment & Plan Note (Signed)
Chronic, he is on Bblocker therapy and diuretic therapy. He admits non-compliance with meds. . Importance of dietary/medication compliance was discussed with the patient.

## 2023-08-06 NOTE — Assessment & Plan Note (Addendum)
Chronic, he is encouraged to stay well hydrated, avoid NSAIDs and keep BP controlled to prevent progression of CKD.  Nephrology input is appreciated.

## 2023-08-09 LAB — BMP8+EGFR
BUN/Creatinine Ratio: 24 (ref 10–24)
BUN: 39 mg/dL — ABNORMAL HIGH (ref 8–27)
CO2: 24 mmol/L (ref 20–29)
Calcium: 9.4 mg/dL (ref 8.6–10.2)
Chloride: 102 mmol/L (ref 96–106)
Creatinine, Ser: 1.64 mg/dL — ABNORMAL HIGH (ref 0.76–1.27)
Glucose: 93 mg/dL (ref 70–99)
Potassium: 5.6 mmol/L — ABNORMAL HIGH (ref 3.5–5.2)
Sodium: 138 mmol/L (ref 134–144)
eGFR: 42 mL/min/{1.73_m2} — ABNORMAL LOW (ref >59)

## 2023-08-09 LAB — URIC ACID: Uric Acid: 6.8 mg/dL (ref 3.8–8.4)

## 2023-08-09 LAB — PROTEIN ELECTROPHORESIS, SERUM
A/G Ratio: 1 (ref 0.7–1.7)
Albumin ELP: 3.5 g/dL (ref 2.9–4.4)
Alpha 1: 0.2 g/dL (ref 0.0–0.4)
Alpha 2: 0.5 g/dL (ref 0.4–1.0)
Beta: 1.1 g/dL (ref 0.7–1.3)
Gamma Globulin: 1.8 g/dL (ref 0.4–1.8)
Globulin, Total: 3.5 g/dL (ref 2.2–3.9)
Total Protein: 7 g/dL (ref 6.0–8.5)

## 2023-08-09 LAB — HEMOGLOBIN A1C
Est. average glucose Bld gHb Est-mCnc: 134 mg/dL
Hgb A1c MFr Bld: 6.3 % — ABNORMAL HIGH (ref 4.8–5.6)

## 2023-08-09 LAB — PTH, INTACT AND CALCIUM: PTH: 47 pg/mL (ref 15–65)

## 2023-08-09 LAB — TSH: TSH: 2.15 u[IU]/mL (ref 0.450–4.500)

## 2023-08-09 LAB — PHOSPHORUS: Phosphorus: 3.9 mg/dL (ref 2.8–4.1)

## 2023-09-28 ENCOUNTER — Ambulatory Visit: Payer: Medicare HMO | Admitting: Internal Medicine

## 2023-12-04 NOTE — Progress Notes (Unsigned)
 [name] Date of Birth: [birth year]   History of Present Illness: [name] is seen today for followup of congestive heart failure. He was last seen by me in March 2022. He has been noncompliant with medical therapy and follow up here and in the device clinic. He has a history of complete heart block and presented in September of 2010 with a heart rate of 10. He had a permanent pacemaker placed at that time. He did have acute renal failure and hyperkalemia at the time. He was on either an ACE inhibitor or ARB at that time. In November 2014 he presented with worsening CHF.  Echocardiogram showed marked LV dysfunction with ejection fraction of 20-25%. He had moderate pulmonary HTN. He was started on nitrates and hydralazine. He underwent right and left heart cath on 08/16/13 with moderate pulmonary HTN and elevated filling pressures. 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   He declined ICD. In June 2016 he was found to have lead fractures of atrial and ventricular leads and underwent revision of pacemaker leads and upgrade to CRT device. Repeat Echo in September 2019 showed persistent LV dysfunction with EF 25-30%. Last device check 01/03/20 was normal.  He was seen in the ED in January 2021 with acute neck pain. CT head and neck with angio was negative. Also had CT chest/abd/pelvis. This did show coronary and aortic atherosclerosis without dissection.   He was seen more recently by Dr Allyne Gee. BNP level was normal. Echo was repeated and showed improvement in EF from 25-30% to 45-50%. Moderate pulmonary HTN.   On follow up today he states he feels great. No chest pain or dyspnea. No palpitations. He still has swelling in his legs. Being fitted for compression stockings at the [facility].   He is taking lasix 40 mg daily, hydralazine, and Coreg. He is going to the gym 5 days a week. Feels energy is good. Pacer device followed at the [facility].   He was seen in the ED in Feb at [facility]. Felt vibration  in chest. Troponin 26>26>24. Pro BNP normal. DC home.    Current Outpatient Medications on File Prior to Visit  Medication Sig Dispense Refill   allopurinol (ZYLOPRIM) 100 MG tablet TAKE 1 TABLET BY MOUTH EVERY DAY 90 tablet 1   Ascorbic Acid (VITAMIN C) 1000 MG tablet Take 2,000 mg by mouth daily.      aspirin EC 81 MG tablet Take 81 mg by mouth daily.     carvedilol (COREG) 12.5 MG tablet Take 1 tablet (12.5 mg total) by mouth 2 (two) times daily. 180 tablet 3   Cholecalciferol (VITAMIN D-3) 1000 UNITS CAPS Take 2,000 Units by mouth daily.      ciclopirox (PENLAC) 8 % solution Apply topically at bedtime. Apply over nail and surrounding skin. Apply daily over previous coat. After seven (7) days, may remove with alcohol and continue cycle. 6.6 mL 0   Colchicine 0.6 MG CAPS Take 0.6 mg by mouth daily as needed (gout).     Fexofenadine HCl (ALLEGRA PO) Take 1 tablet by mouth daily as needed (itching).     fluticasone-salmeterol (ADVAIR DISKUS) 250-50 MCG/ACT AEPB Inhale 1 puff twice a day in the morning and evening 12 hours apart 180 each 1   furosemide (LASIX) 40 MG tablet TAKE 1 TABLET BY MOUTH EVERY DAY 90 tablet 1   Garlic 1000 MG CAPS Take 3,000 mg by mouth daily.     hydrALAZINE (APRESOLINE) 25 MG  tablet Take 1 tablet (25 mg total) by mouth 3 (three) times daily. 270 tablet 3   No current facility-administered medications on file prior to visit.    Allergies  Allergen Reactions   Avalide [Irbesartan-Hydrochlorothiazide] Anaphylaxis   Penicillins Anaphylaxis    Did it involve swelling of the face/tongue/throat, SOB, or low BP? Yes Did it involve sudden or severe rash/hives, skin peeling, or any reaction on the inside of your mouth or nose? Yes Did you need to seek medical attention at a hospital or doctor's office? Reaction occurred at MD office - penicillin injection When did it last happen?      young adult If all above answers are "NO", may proceed with cephalosporin use.    Tinactin [Tolnaftate] Hives and Rash         Past Medical History:  Diagnosis Date   Chronic renal insufficiency    WITH BASELINE CREATININE OF 1.6   Chronic systolic CHF (congestive heart failure) (HCC)    Complete heart block (HCC)    s/p PPM by Dr Reyes Ivan   Coronary disease    NONOBSTRUCTIVE   GERD (gastroesophageal reflux disease)    Gout    Hyperkalemia    Hypertension    Nonischemic cardiomyopathy (HCC)    Presence of permanent cardiac pacemaker     Past Surgical History:  Procedure Laterality Date   CARDIAC CATHETERIZATION  08/2013   EP IMPLANTABLE DEVICE N/A 02/15/2015   Procedure: Lead Revision/Repair;  Surgeon: Hillis Range, MD;  Location: [facility] INVASIVE CV LAB;  Service: Cardiovascular;  Laterality: N/A;   INGUINAL HERNIA REPAIR Bilateral    INSERT / REPLACE / REMOVE PACEMAKER  05/20/2009   MDT by Dr Reyes Ivan for complete heart block   LEFT AND RIGHT HEART CATHETERIZATION WITH CORONARY ANGIOGRAM N/A 08/16/2013   Procedure: LEFT AND RIGHT HEART CATHETERIZATION WITH CORONARY ANGIOGRAM;  Surgeon: Omaya Nieland M [place], MD;  Location: [facility] CATH LAB;  Service: Cardiovascular;  Laterality: N/A;    Social History   Tobacco Use  Smoking Status Former   Current packs/day: 1.00   Average packs/day: 1 pack/day for 15.0 years (15.0 ttl pk-yrs)   Types: Cigarettes  Smokeless Tobacco Never  Tobacco Comments   "quit smoking cigarettes in 1985"    Social History   Substance and Sexual Activity  Alcohol Use Yes   Alcohol/week: 7.0 standard drinks of alcohol   Types: 7 Shots of liquor per week    Family History  Problem Relation Age of Onset   Diabetes Father     Review of Systems:  As note in HPI. All other systems were reviewed and are negative.  Physical Exam: There were no vitals taken for this visit.  GENERAL:  Well appearing overweight BM in NAD HEENT:  PERRL, EOMI, sclera are clear. Oropharynx is clear. NECK:  No jugular venous distention, carotid upstroke brisk and  symmetric, no bruits, no thyromegaly or adenopathy LUNGS:  Clear to auscultation bilaterally CHEST:  Unremarkable HEART:  RRR,  PMI not displaced or sustained,S1 and S2 within normal limits, no S3, no S4: no clicks, no rubs, no murmurs ABD:  Soft, nontender. BS +, no masses or bruits. No hepatomegaly, no splenomegaly EXT:  2 + pulses throughout, 2+ pretibial edema, no cyanosis no clubbing SKIN:  Warm and dry.  No rashes NEURO:  Alert and oriented x 3. Cranial nerves II through XII intact. PSYCH:  Cognitively intact    LABORATORY DATA: Lab Results  Component Value Date   WBC 5.3 03/23/2023  HGB 12.8 (L) 03/23/2023   HCT 41.3 03/23/2023   PLT 168 03/23/2023   GLUCOSE 93 07/30/2023   CHOL 209 (H) 03/23/2023   TRIG 72 03/23/2023   HDL 62 03/23/2023   LDLCALC 134 (H) 03/23/2023   ALT 22 03/23/2023   AST 23 03/23/2023   NA 138 07/30/2023   K 5.6 (H) 07/30/2023   CL 102 07/30/2023   CREATININE 1.64 (H) 07/30/2023   BUN 39 (H) 07/30/2023   CO2 24 07/30/2023   TSH 2.150 07/30/2023   INR 1.18 02/14/2015   HGBA1C 6.3 (H) 07/30/2023   MICROALBUR 80 02/11/2021    Dated 07/09/17: cholesterol 203, triglycerides 62, HDL 72, LDL 119.  Dated 01/06/18: creatinine 1.48. A1c 5.9%. Chemistries Normal  Dated 03/23/23: cholesterol 209, triglycerides 72, HDL 62, LDL 134, LFTs normal. CBC normal.  Dated 08/04/23: BUN 39, creatinine 1.64. potassium 5.6. otherwise CMET normal. A1c 6.3%. TSH normal  Ecg today shows NSR with BiV pacing rate 90. I have personally reviewed and interpreted this study.    Echo 06/02/18: Study Conclusions   - Left ventricle: The cavity size was moderately dilated. There was   moderate concentric hypertrophy. Systolic function was severely   reduced. The estimated ejection fraction was in the range of 25%   to 30%. Severe diffuse hypokinesis. There was an increased   relative contribution of atrial contraction to ventricular   filling. Doppler parameters are consistent  with abnormal left   ventricular relaxation (grade 1 diastolic dysfunction). - Aortic valve: Trileaflet; moderately thickened, moderately   calcified leaflets. - Mitral valve: There was mild regurgitation. - Left atrium: The atrium was mildly dilated. - Right ventricle: Pacer wire or catheter noted in right ventricle.   Systolic function was mildly to moderately reduced. - Right atrium: Pacer wire or catheter noted in right atrium. - Pulmonary arteries: Systolic pressure could not be accurately   estimated.  Echo 09/24/22: IMPRESSIONS     1. Left ventricular ejection fraction, by estimation, is 45 to 50%. The  left ventricle has mildly decreased function. The left ventricle  demonstrates global hypokinesis. There is mild concentric left ventricular  hypertrophy. Left ventricular diastolic  function could not be evaluated.   2. Right ventricular systolic function is normal. The right ventricular  size is normal. There is moderately elevated pulmonary artery systolic  pressure. The estimated right ventricular systolic pressure is 48.7 mmHg.   3. Left atrial size was mildly dilated.   4. The mitral valve is grossly normal. Trivial mitral valve  regurgitation. No evidence of mitral stenosis.   5. The aortic valve is tricuspid. There is moderate calcification of the  aortic valve. There is moderate thickening of the aortic valve. Aortic  valve regurgitation is trivial. Aortic valve sclerosis/calcification is  present, without any evidence of  aortic stenosis.   6. The inferior vena cava is normal in size with greater than 50%  respiratory variability, suggesting right atrial pressure of 3 mmHg.   Comparison(s): Changes from prior study are noted.   Conclusion(s)/Recommendation(s): EF improved compared to prior. Right  atrial pressure normal, with moderately elevated pulmonary pressures.    Assessment / Plan: 1. Chronic systolic congestive heart failure.  Ejection fraction of 25-30%  in past. S/p CRT therapy.  Nonischemic.  He is intolerant to ACE inhibitors/ARBs related to hyperkalemia and acute renal failure. Most recent Echo showed improvement in EF to 45-50%.  He is on Coreg today 12.5 mg bid, hydralazine,  and lasix. Continue current therapy  2. CKD stage 3. Followed by Dr. Hyman Hopes- Nephrology. Creatinine stable 1.53  3 HTN- control is good   4. Complete heart block status post permanent pacemaker. Now with CRT device. He states this is now followed at the [place]  5. HLD. Discussed statin therapy but he doesn't want to take pills. States he can get his cholesterol down with diet.   6. Obesity  7. Aortic atherosclerosis. No obstructive CAD on prior cath.  I will plan on follow up in one year

## 2023-12-11 ENCOUNTER — Ambulatory Visit: Payer: Medicare HMO | Attending: Cardiology | Admitting: Cardiology

## 2023-12-11 ENCOUNTER — Encounter: Payer: Self-pay | Admitting: Cardiology

## 2023-12-11 VITALS — BP 138/80 | HR 78 | Ht 69.0 in | Wt 264.8 lb

## 2023-12-11 DIAGNOSIS — I5022 Chronic systolic (congestive) heart failure: Secondary | ICD-10-CM | POA: Diagnosis not present

## 2023-12-11 DIAGNOSIS — N1831 Chronic kidney disease, stage 3a: Secondary | ICD-10-CM

## 2023-12-11 DIAGNOSIS — I10 Essential (primary) hypertension: Secondary | ICD-10-CM

## 2023-12-11 DIAGNOSIS — I442 Atrioventricular block, complete: Secondary | ICD-10-CM

## 2023-12-11 DIAGNOSIS — Z950 Presence of cardiac pacemaker: Secondary | ICD-10-CM

## 2023-12-11 NOTE — Patient Instructions (Signed)
 Medication Instructions:  Continue same medications *If you need a refill on your cardiac medications before your next appointment, please call your pharmacy*  Lab Work: None ordered  Testing/Procedures: None ordered  Follow-Up: At [facility], you and your health needs are our priority.  As part of our continuing mission to provide you with exceptional heart care, our providers are all part of one team.  This team includes your primary Cardiologist (physician) and Advanced Practice Providers or APPs (Physician Assistants and Nurse Practitioners) who all work together to provide you with the care you need, when you need it.  Your next appointment:  1 year    Call in Dec to schedule April appointment     Provider:  Dr.[name]   We recommend signing up for the patient portal called "MyChart".  Sign up information is provided on this After Visit Summary.  MyChart is used to connect with patients for Virtual Visits (Telemedicine).  Patients are able to view lab/test results, encounter notes, upcoming appointments, etc.  Non-urgent messages can be sent to your provider as well.   To learn more about what you can do with MyChart, go to [email or web address].       1st Floor: - Lobby - Registration  - Pharmacy  - Lab - Cafe  2nd Floor: - PV Lab - Diagnostic Testing (echo, CT, nuclear med)  3rd Floor: - Vacant  4th Floor: - TCTS (cardiothoracic surgery) - AFib Clinic - Structural Heart Clinic - Vascular Surgery  - Vascular Ultrasound  5th Floor: - HeartCare Cardiology (general and EP) - Clinical Pharmacy for coumadin, hypertension, lipid, weight-loss medications, and med management appointments    Valet parking services will be available as well.

## 2024-01-05 LAB — LAB REPORT - SCANNED
Albumin, Urine POC: 462.4
Albumin/Creatinine Ratio, Urine, POC: 572
PTH: 52

## 2024-03-16 ENCOUNTER — Other Ambulatory Visit: Payer: Self-pay | Admitting: Internal Medicine

## 2024-03-24 ENCOUNTER — Encounter: Payer: Medicare HMO | Admitting: Internal Medicine

## 2024-03-30 ENCOUNTER — Telehealth: Payer: Self-pay

## 2024-03-30 ENCOUNTER — Encounter: Payer: Medicare HMO | Admitting: Internal Medicine

## 2024-03-30 NOTE — Progress Notes (Deleted)
 I,Dontrey Snellgrove T Emmitt, CMA,acting as a Neurosurgeon for Gregory LOISE Slocumb, MD.,have documented all relevant documentation on the behalf of Gregory LOISE Slocumb, MD,as directed by  Gregory LOISE Slocumb, MD while in the presence of Gregory LOISE Slocumb, MD.  Subjective:   Patient ID: [name] , male    DOB: 1946-04-04 , 78 y.o.   MRN: [id number]  No chief complaint on file.   HPI  HPI   Past Medical History:  Diagnosis Date   Chronic renal insufficiency    WITH BASELINE CREATININE OF 1.6   Chronic systolic CHF (congestive heart failure) (HCC)    Complete heart block (HCC)    s/p PPM by Dr Ellin   Coronary disease    NONOBSTRUCTIVE   GERD (gastroesophageal reflux disease)    Gout    Hyperkalemia    Hypertension    Nonischemic cardiomyopathy (HCC)    Presence of permanent cardiac pacemaker      Family History  Problem Relation Age of Onset   Diabetes Father      Current Outpatient Medications:    allopurinol (ZYLOPRIM) 100 MG tablet, TAKE 1 TABLET BY MOUTH EVERY DAY, Disp: 90 tablet, Rfl: 1   Ascorbic Acid (VITAMIN C) 1000 MG tablet, Take 2,000 mg by mouth daily. , Disp: , Rfl:    aspirin  EC 81 MG tablet, Take 81 mg by mouth daily., Disp: , Rfl:    carvedilol  (COREG ) 12.5 MG tablet, Take 1 tablet (12.5 mg total) by mouth 2 (two) times daily., Disp: 180 tablet, Rfl: 3   Cholecalciferol (VITAMIN D -3) 1000 UNITS CAPS, Take 2,000 Units by mouth daily. , Disp: , Rfl:    ciclopirox  (PENLAC ) 8 % solution, Apply topically at bedtime. Apply over nail and surrounding skin. Apply daily over previous coat. After seven (7) days, may remove with alcohol and continue cycle., Disp: 6.6 mL, Rfl: 0   Colchicine 0.6 MG CAPS, Take 0.6 mg by mouth daily as needed (gout)., Disp: , Rfl:    Fexofenadine HCl (ALLEGRA PO), Take 1 tablet by mouth daily as needed (itching)., Disp: , Rfl:    fluticasone -salmeterol (ADVAIR DISKUS) 250-50 MCG/ACT AEPB, Inhale 1 puff twice a day in the morning and evening 12 hours apart,  Disp: 180 each, Rfl: 1   furosemide  (LASIX ) 40 MG tablet, TAKE 1 TABLET BY MOUTH EVERY DAY, Disp: 90 tablet, Rfl: 1   Garlic 1000 MG CAPS, Take 3,000 mg by mouth daily., Disp: , Rfl:    hydrALAZINE  (APRESOLINE ) 25 MG tablet, Take 1 tablet (25 mg total) by mouth 3 (three) times daily., Disp: 270 tablet, Rfl: 3   Allergies  Allergen Reactions   Avalide [Irbesartan-Hydrochlorothiazide] Anaphylaxis   Penicillins Anaphylaxis    Did it involve swelling of the face/tongue/throat, SOB, or low BP? Yes Did it involve sudden or severe rash/hives, skin peeling, or any reaction on the inside of your mouth or nose? Yes Did you need to seek medical attention at a hospital or doctor's office? Reaction occurred at MD office - penicillin injection When did it last happen?      young adult If all above answers are "NO", may proceed with cephalosporin use.   Tinactin [Tolnaftate] Hives and Rash          Men's preventive visit. Patient Health Questionnaire (PHQ-2) is  Flowsheet Row Clinical Support from 07/30/2023 in [facility]  PHQ-2 Total Score 0  . Patient is on a *** diet. Marital status: Divorced. Relevant history for alcohol  use is:  Social History   Substance and Sexual Activity  Alcohol Use Yes   Alcohol/week: 7.0 standard drinks of alcohol   Types: 7 Shots of liquor per week  . Relevant history for tobacco use is:  Social History   Tobacco Use  Smoking Status Former   Current packs/day: 1.00   Average packs/day: 1 pack/day for 15.0 years (15.0 ttl pk-yrs)   Types: Cigarettes  Smokeless Tobacco Never  Tobacco Comments   quit smoking cigarettes in 1985  .   Review of Systems  Constitutional: Negative.   Respiratory: Negative.    Gastrointestinal: Negative.   Endocrine: Negative.   Skin: Negative.   Allergic/Immunologic: Negative.   Hematological: Negative.      There were no vitals filed for this visit. There is no height or weight on file  to calculate BMI.  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 Patient was given opportunity to ask questions. Patient verbalized understanding of the plan and was able to repeat key elements of the plan. All questions were answered to their satisfaction.   Gregory LOISE Slocumb, MD  I, Gregory LOISE Slocumb, MD, have reviewed all documentation for this visit. The documentation on 03/30/24 for the exam, diagnosis, procedures, and orders are all accurate and complete.

## 2024-03-30 NOTE — Patient Instructions (Incomplete)
 Health Maintenance, Male  Adopting a healthy lifestyle and getting preventive care are important in promoting health and wellness. Ask your health care provider about:  The right schedule for you to have regular tests and exams.  Things you can do on your own to prevent diseases and keep yourself healthy.  What should I know about diet, weight, and exercise?  Eat a healthy diet    Eat a diet that includes plenty of vegetables, fruits, low-fat dairy products, and lean protein.  Do not eat a lot of foods that are high in solid fats, added sugars, or sodium.  Maintain a healthy weight  Body mass index (BMI) is a measurement that can be used to identify possible weight problems. It estimates body fat based on height and weight. Your health care provider can help determine your BMI and help you achieve or maintain a healthy weight.  Get regular exercise  Get regular exercise. This is one of the most important things you can do for your health. Most adults should:  Exercise for at least 150 minutes each week. The exercise should increase your heart rate and make you sweat (moderate-intensity exercise).  Do strengthening exercises at least twice a week. This is in addition to the moderate-intensity exercise.  Spend less time sitting. Even light physical activity can be beneficial.  Watch cholesterol and blood lipids  Have your blood tested for lipids and cholesterol at 80 years of age, then have this test every 5 years.  You may need to have your cholesterol levels checked more often if:  Your lipid or cholesterol levels are high.  You are older than 80 years of age.  You are at high risk for heart disease.  What should I know about cancer screening?  Many types of cancers can be detected early and may often be prevented. Depending on your health history and family history, you may need to have cancer screening at various ages. This may include screening for:  Colorectal cancer.  Prostate cancer.  Skin cancer.  Lung  cancer.  What should I know about heart disease, diabetes, and high blood pressure?  Blood pressure and heart disease  High blood pressure causes heart disease and increases the risk of stroke. This is more likely to develop in people who have high blood pressure readings or are overweight.  Talk with your health care provider about your target blood pressure readings.  Have your blood pressure checked:  Every 3-5 years if you are 9-95 years of age.  Every year if you are 85 years old or older.  If you are between the ages of 29 and 29 and are a current or former smoker, ask your health care provider if you should have a one-time screening for abdominal aortic aneurysm (AAA).  Diabetes  Have regular diabetes screenings. This checks your fasting blood sugar level. Have the screening done:  Once every three years after age 23 if you are at a normal weight and have a low risk for diabetes.  More often and at a younger age if you are overweight or have a high risk for diabetes.  What should I know about preventing infection?  Hepatitis B  If you have a higher risk for hepatitis B, you should be screened for this virus. Talk with your health care provider to find out if you are at risk for hepatitis B infection.  Hepatitis C  Blood testing is recommended for:  Everyone born from [birth year] through 1967.  Anyone  with known risk factors for hepatitis C.  Sexually transmitted infections (STIs)  You should be screened each year for STIs, including gonorrhea and chlamydia, if:  You are sexually active and are younger than 80 years of age.  You are older than 80 years of age and your health care provider tells you that you are at risk for this type of infection.  Your sexual activity has changed since you were last screened, and you are at increased risk for chlamydia or gonorrhea. Ask your health care provider if you are at risk.  Ask your health care provider about whether you are at high risk for HIV. Your health care provider  may recommend a prescription medicine to help prevent HIV infection. If you choose to take medicine to prevent HIV, you should first get tested for HIV. You should then be tested every 3 months for as long as you are taking the medicine.  Follow these instructions at home:  Alcohol use  Do not drink alcohol if your health care provider tells you not to drink.  If you drink alcohol:  Limit how much you have to 0-2 drinks a day.  Know how much alcohol is in your drink. In the [place] one drink equals one 12 oz bottle of beer (355 mL), one 5 oz glass of wine (148 mL), or one 1 oz glass of hard liquor (44 mL).  Lifestyle  Do not use any products that contain nicotine or tobacco. These products include cigarettes, chewing tobacco, and vaping devices, such as e-cigarettes. If you need help quitting, ask your health care provider.  Do not use street drugs.  Do not share needles.  Ask your health care provider for help if you need support or information about quitting drugs.  General instructions  Schedule regular health, dental, and eye exams.  Stay current with your vaccines.  Tell your health care provider if:  You often feel depressed.  You have ever been abused or do not feel safe at home.  Summary  Adopting a healthy lifestyle and getting preventive care are important in promoting health and wellness.  Follow your health care provider's instructions about healthy diet, exercising, and getting tested or screened for diseases.  Follow your health care provider's instructions on monitoring your cholesterol and blood pressure.  This information is not intended to replace advice given to you by your health care provider. Make sure you discuss any questions you have with your health care provider.  Document Revised: 01/14/2021 Document Reviewed: 01/14/2021  Elsevier Patient Education  2024 [employer].

## 2024-03-30 NOTE — Telephone Encounter (Signed)
 CALLED PT TO RESCHEDULE HIS HM W MS PAT AND HE STATED HE WAS NOT GONE DO THAT AND HE WILL JUST WAIT TO GET IT NEXT YEAR.

## 2024-06-30 LAB — LAB REPORT - SCANNED: EGFR: 34

## 2024-08-24 ENCOUNTER — Ambulatory Visit: Payer: Medicare HMO

## 2024-08-24 ENCOUNTER — Ambulatory Visit: Payer: Medicare HMO | Admitting: Internal Medicine

## 2024-08-24 NOTE — Patient Instructions (Incomplete)
 Hypertension, Adult Hypertension is another name for high blood pressure. High blood pressure forces your heart to work harder to pump blood. This can cause problems over time. There are two numbers in a blood pressure reading. There is a top number (systolic) over a bottom number (diastolic). It is best to have a blood pressure that is below 120/80. What are the causes? The cause of this condition is not known. Some other conditions can lead to high blood pressure. What increases the risk? Some lifestyle factors can make you more likely to develop high blood pressure: Smoking. Not getting enough exercise or physical activity. Being overweight. Having too much fat, sugar, calories, or salt (sodium) in your diet. Drinking too much alcohol. Other risk factors include: Having any of these conditions: Heart disease. Diabetes. High cholesterol. Kidney disease. Obstructive sleep apnea. Having a family history of high blood pressure and high cholesterol. Age. The risk increases with age. Stress. What are the signs or symptoms? High blood pressure may not cause symptoms. Very high blood pressure (hypertensive crisis) may cause: Headache. Fast or uneven heartbeats (palpitations). Shortness of breath. Nosebleed. Vomiting or feeling like you may vomit (nauseous). Changes in how you see. Very bad chest pain. Feeling dizzy. Seizures. How is this treated? This condition is treated by making healthy lifestyle changes, such as: Eating healthy foods. Exercising more. Drinking less alcohol. Your doctor may prescribe medicine if lifestyle changes do not help enough and if: Your top number is above 130. Your bottom number is above 80. Your personal target blood pressure may vary. Follow these instructions at home: Eating and drinking  If told, follow the DASH eating plan. To follow this plan: Fill one half of your plate at each meal with fruits and vegetables. Fill one fourth of your plate  at each meal with whole grains. Whole grains include whole-wheat pasta, brown rice, and whole-grain bread. Eat or drink low-fat dairy products, such as skim milk or low-fat yogurt. Fill one fourth of your plate at each meal with low-fat (lean) proteins. Low-fat proteins include fish, chicken without skin, eggs, beans, and tofu. Avoid fatty meat, cured and processed meat, or chicken with skin. Avoid pre-made or processed food. Limit the amount of salt in your diet to less than 1,500 mg each day. Do not drink alcohol if: Your doctor tells you not to drink. You are pregnant, may be pregnant, or are planning to become pregnant. If you drink alcohol: Limit how much you have to: 0-1 drink a day for women. 0-2 drinks a day for men. Know how much alcohol is in your drink. In the [place] one drink equals one 12 oz bottle of beer (355 mL), one 5 oz glass of wine (148 mL), or one 1 oz glass of hard liquor (44 mL). Lifestyle  Work with your doctor to stay at a healthy weight or to lose weight. Ask your doctor what the best weight is for you. Get at least 30 minutes of exercise that causes your heart to beat faster (aerobic exercise) most days of the week. This may include walking, swimming, or biking. Get at least 30 minutes of exercise that strengthens your muscles (resistance exercise) at least 3 days a week. This may include lifting weights or doing Pilates. Do not smoke or use any products that contain nicotine or tobacco. If you need help quitting, ask your doctor. Check your blood pressure at home as told by your doctor. Keep all follow-up visits. Medicines Take over-the-counter and prescription medicines  only as told by your doctor. Follow directions carefully. Do not skip doses of blood pressure medicine. The medicine does not work as well if you skip doses. Skipping doses also puts you at risk for problems. Ask your doctor about side effects or reactions to medicines that you should watch  for. Contact a doctor if: You think you are having a reaction to the medicine you are taking. You have headaches that keep coming back. You feel dizzy. You have swelling in your ankles. You have trouble with your vision. Get help right away if: You get a very bad headache. You start to feel mixed up (confused). You feel weak or numb. You feel faint. You have very bad pain in your: Chest. Belly (abdomen). You vomit more than once. You have trouble breathing. These symptoms may be an emergency. Get help right away. Call 911. Do not wait to see if the symptoms will go away. Do not drive yourself to the hospital. Summary Hypertension is another name for high blood pressure. High blood pressure forces your heart to work harder to pump blood. For most people, a normal blood pressure is less than 120/80. Making healthy choices can help lower blood pressure. If your blood pressure does not get lower with healthy choices, you may need to take medicine. This information is not intended to replace advice given to you by your health care provider. Make sure you discuss any questions you have with your health care provider. Document Revised: 06/13/2021 Document Reviewed: 06/13/2021 Elsevier Patient Education  2024 [employer].

## 2024-08-24 NOTE — Progress Notes (Deleted)
 I,Gabrial Domine T Emmitt, CMA,acting as a neurosurgeon for [name], MD.,have documented all relevant documentation on the behalf of [name], MD,as directed by  [name], MD while in the presence of [name], MD.  Subjective:  Patient ID: [name] , male    DOB: July 19, 1946 , 78 y.o.   MRN: [id number]  No chief complaint on file.   HPI  Patient presents today for a bp & cholesterol check.  He reports compliance with meds. He denies headaches, chest pain and shortness of breath.  AWV completed with [facility] Advisor: [name].   Hypertension This is a chronic problem. The current episode started more than 1 year ago. The problem has been gradually improving since onset. The problem is controlled. Pertinent negatives include no blurred vision, chest pain, headaches or shortness of breath. Risk factors for coronary artery disease include dyslipidemia, male gender and obesity. Compliance problems include diet.  Identifiable causes of hypertension include chronic renal disease.  Hyperlipidemia Exacerbating diseases include chronic renal disease. Pertinent negatives include no chest pain or shortness of breath.     Past Medical History:  Diagnosis Date   Chronic renal insufficiency    WITH BASELINE CREATININE OF 1.6   Chronic systolic CHF (congestive heart failure) (HCC)    Complete heart block (HCC)    s/p PPM by Dr Ellin   Coronary disease    NONOBSTRUCTIVE   GERD (gastroesophageal reflux disease)    Gout    Hyperkalemia    Hypertension    Nonischemic cardiomyopathy (HCC)    Presence of permanent cardiac pacemaker      Family History  Problem Relation Age of Onset   Diabetes Father     Current Medications[1]   Allergies[2]   Review of Systems  Constitutional: Negative.   Eyes:  Negative for blurred vision.  Respiratory: Negative.  Negative for shortness of breath.   Cardiovascular:  Negative for chest pain.  Gastrointestinal: Negative.   Skin:  Negative.   Allergic/Immunologic: Negative.   Neurological:  Negative for headaches.  Hematological: Negative.      There were no vitals filed for this visit. There is no height or weight on file to calculate BMI.  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    No follow-ups on file.  Patient was given opportunity to ask questions. Patient verbalized understanding of the plan and was able to repeat key elements of the plan. All questions were answered to their satisfaction.    I, [name], MD, have reviewed all documentation for this visit. The documentation on 08/24/2024 for the exam, diagnosis, procedures, and orders are all accurate and complete.   IF YOU HAVE BEEN REFERRED TO A SPECIALIST, IT MAY TAKE  1-2 WEEKS TO SCHEDULE/PROCESS THE REFERRAL. IF YOU HAVE NOT HEARD FROM US /SPECIALIST IN TWO WEEKS, PLEASE GIVE US  A CALL AT [phone].     [1]  Current Outpatient Medications:    allopurinol (ZYLOPRIM) 100 MG tablet, TAKE 1 TABLET BY MOUTH EVERY DAY, Disp: 90 tablet, Rfl: 1   Ascorbic Acid (VITAMIN C) 1000 MG tablet, Take 2,000 mg by mouth daily. , Disp: , Rfl:    aspirin  EC 81 MG tablet, Take 81 mg by mouth daily., Disp: , Rfl:    carvedilol  (COREG ) 12.5 MG tablet, Take 1 tablet (12.5 mg total) by mouth  2 (two) times daily., Disp: 180 tablet, Rfl: 3   Cholecalciferol (VITAMIN D -3) 1000 UNITS CAPS, Take 2,000 Units by mouth daily. , Disp: , Rfl:    ciclopirox  (PENLAC ) 8 % solution, Apply topically at bedtime. Apply over nail and surrounding skin. Apply daily over previous coat. After seven (7) days, may remove with alcohol and continue cycle., Disp: 6.6 mL, Rfl: 0   Colchicine 0.6 MG CAPS, Take 0.6 mg by mouth daily as needed (gout)., Disp: , Rfl:    Fexofenadine HCl (ALLEGRA PO), Take 1 tablet by mouth daily as needed (itching)., Disp: , Rfl:    fluticasone -salmeterol (ADVAIR DISKUS) 250-50 MCG/ACT AEPB, Inhale 1 puff twice a day in the morning and evening 12 hours apart, Disp: 180 each, Rfl: 1   furosemide  (LASIX ) 40 MG tablet, TAKE 1 TABLET BY MOUTH EVERY DAY, Disp: 90 tablet, Rfl: 1   Garlic 1000 MG CAPS, Take 3,000 mg by mouth daily., Disp: , Rfl:    hydrALAZINE  (APRESOLINE ) 25 MG tablet, Take 1 tablet (25 mg total) by mouth 3 (three) times daily., Disp: 270 tablet, Rfl: 3 [2]  Allergies Allergen Reactions   Avalide [Irbesartan-Hydrochlorothiazide] Anaphylaxis   Penicillins Anaphylaxis    Did it involve swelling of the face/tongue/throat, SOB, or low BP? Yes Did it involve sudden or severe rash/hives, skin peeling, or any reaction on the inside of your mouth or nose? Yes Did you need to seek medical attention at a hospital or doctor's office? Reaction occurred at MD office - penicillin injection When did it last happen?      young adult If all above answers are NO, may proceed with cephalosporin use.   Tinactin [Tolnaftate] Hives and Rash

## 2024-09-28 ENCOUNTER — Ambulatory Visit: Payer: Self-pay

## 2024-09-28 VITALS — BP 138/78 | HR 76 | Temp 97.7°F | Ht 69.0 in | Wt 274.0 lb

## 2024-09-28 DIAGNOSIS — Z0000 Encounter for general adult medical examination without abnormal findings: Secondary | ICD-10-CM | POA: Diagnosis not present

## 2024-09-28 NOTE — Patient Instructions (Signed)
 [name],  Thank you for taking the time for your Medicare Wellness Visit. I appreciate your continued commitment to your health goals. Please review the care plan we discussed, and feel free to reach out if I can assist you further.  Please note that Annual Wellness Visits do not include a physical exam. Some assessments may be limited, especially if the visit was conducted virtually. If needed, we may recommend an in-person follow-up with your provider.  Ongoing Care Seeing your primary care provider every 3 to 6 months helps us  monitor your health and provide consistent, personalized care.   Referrals If a referral was made during today's visit and you haven't received any updates within two weeks, please contact the referred provider directly to check on the status.  Recommended Screenings:  Health Maintenance  Topic Date Due   DTaP/Tdap/Td vaccine (1 - Tdap) Never done   Flu Shot  04/08/2024   COVID-19 Vaccine (7 - 2025-26 season) 05/09/2024   Medicare Annual Wellness Visit  07/29/2024   Pneumococcal Vaccine for age over 53  Completed   Zoster (Shingles) Vaccine  Completed   Meningitis B Vaccine  Aged Out   Hepatitis C Screening  Discontinued       09/28/2024    9:33 AM  Advanced Directives  Does Patient Have a Medical Advance Directive? Yes  Type of Estate Agent of [place];Living will  Does patient want to make changes to medical advance directive? No - Patient declined  Copy of Healthcare Power of Attorney in Chart? No - copy requested    Vision: Annual vision screenings are recommended for early detection of glaucoma, cataracts, and diabetic retinopathy. These exams can also reveal signs of chronic conditions such as diabetes and high blood pressure.  Dental: Annual dental screenings help detect early signs of oral cancer, gum disease, and other conditions linked to overall health, including heart disease and diabetes.  Please see the attached  documents for additional preventive care recommendations.

## 2024-09-28 NOTE — Progress Notes (Signed)
 "  Chief Complaint  Patient presents with   Medicare Wellness     Subjective:   [name] is a 79 y.o. male who presents for a Medicare Annual Wellness Visit.  Visit info / Clinical Intake: Medicare Wellness Visit Type:: Subsequent Annual Wellness Visit Persons participating in visit and providing information:: patient Medicare Wellness Visit Mode:: In-person (required for WTM) Interpreter Needed?: No Pre-visit prep was completed: yes AWV questionnaire completed by patient prior to visit?: no Living arrangements:: (!) lives alone Patient's Overall Health Status Rating: good Typical amount of pain: some Does pain affect daily life?: no Are you currently prescribed opioids?: no  Dietary Habits and Nutritional Risks How many meals a day?: (!) 1 Eats fruit and vegetables daily?: (!) no Most meals are obtained by: preparing own meals In the last 2 weeks, have you had any of the following?: none Diabetic:: no  Functional Status Activities of Daily Living (to include ambulation/medication): Independent Ambulation: Independent Medication Administration: Independent Home Management (perform basic housework or laundry): Independent Manage your own finances?: yes Primary transportation is: driving Concerns about vision?: no *vision screening is required for WTM* Concerns about hearing?: (!) yes Uses hearing aids?: (!) yes  Fall Screening Falls in the past year?: 0 Number of falls in past year: 0 Was there an injury with Fall?: 0 Fall Risk Category Calculator: 0 Patient Fall Risk Level: Low Fall Risk  Fall Risk Patient at Risk for Falls Due to: Medication side effect Fall risk Follow up: Falls prevention discussed; Education provided; Falls evaluation completed  Home and Transportation Safety: All rugs have non-skid backing?: (!) no All stairs or steps have railings?: N/A, no stairs Grab bars in the bathtub or shower?: (!) no Have non-skid surface in bathtub or  shower?: yes Good home lighting?: yes Regular seat belt use?: yes Hospital stays in the last year:: no  Cognitive Assessment Difficulty concentrating, remembering, or making decisions? : no Will 6CIT or Mini Cog be Completed: yes What year is it?: 0 points What month is it?: 0 points Give patient an address phrase to remember (5 components): [address] [place] About what time is it?: 0 points Count backwards from 20 to 1: 0 points Say the months of the year in reverse: 4 points Repeat the address phrase from earlier: 0 points 6 CIT Score: 4 points  Advance Directives (For Healthcare) Does Patient Have a Medical Advance Directive?: Yes Does patient want to make changes to medical advance directive?: No - Patient declined Type of Advance Directive: Healthcare Power of [place]; Living will Copy of Healthcare Power of Attorney in Chart?: No - copy requested Copy of Living Will in Chart?: No - copy requested  Reviewed/Updated  Reviewed/Updated: Reviewed All (Medical, Surgical, Family, Medications, Allergies, Care Teams, Patient Goals)    Allergies (verified) Avalide [irbesartan-hydrochlorothiazide], Penicillins, and Tinactin [tolnaftate]   Current Medications (verified) Outpatient Encounter Medications as of 09/28/2024  Medication Sig   allopurinol (ZYLOPRIM) 100 MG tablet TAKE 1 TABLET BY MOUTH EVERY DAY   Ascorbic Acid (VITAMIN C) 1000 MG tablet Take 2,000 mg by mouth daily.    aspirin  EC 81 MG tablet Take 81 mg by mouth daily.   carvedilol  (COREG ) 12.5 MG tablet Take 1 tablet (12.5 mg total) by mouth 2 (two) times daily.   Cholecalciferol (VITAMIN D -3) 1000 UNITS CAPS Take 2,000 Units by mouth daily.    ciclopirox  (PENLAC ) 8 % solution Apply topically at bedtime. Apply over nail and surrounding skin. Apply daily over  previous coat. After seven (7) days, may remove with alcohol and continue cycle.   Colchicine 0.6 MG CAPS Take 0.6 mg by mouth daily as needed (gout).    Fexofenadine HCl (ALLEGRA PO) Take 1 tablet by mouth daily as needed (itching).   fluticasone -salmeterol (ADVAIR DISKUS) 250-50 MCG/ACT AEPB Inhale 1 puff twice a day in the morning and evening 12 hours apart   Garlic 1000 MG CAPS Take 3,000 mg by mouth daily.   hydrALAZINE  (APRESOLINE ) 25 MG tablet Take 1 tablet (25 mg total) by mouth 3 (three) times daily.   furosemide  (LASIX ) 40 MG tablet TAKE 1 TABLET BY MOUTH EVERY DAY (Patient not taking: Reported on 09/28/2024)   No facility-administered encounter medications on file as of 09/28/2024.    History: Past Medical History:  Diagnosis Date   Chronic renal insufficiency    WITH BASELINE CREATININE OF 1.6   Chronic systolic CHF (congestive heart failure) (HCC)    Complete heart block (HCC)    s/p PPM by Dr Ellin   Coronary disease    NONOBSTRUCTIVE   GERD (gastroesophageal reflux disease)    Gout    Hyperkalemia    Hypertension    Nonischemic cardiomyopathy (HCC)    Presence of permanent cardiac pacemaker    Past Surgical History:  Procedure Laterality Date   CARDIAC CATHETERIZATION  08/2013   EP IMPLANTABLE DEVICE N/A 02/15/2015   Procedure: Lead Revision/Repair;  Surgeon: Lynwood Rakers, MD;  Location: [facility] INVASIVE CV LAB;  Service: Cardiovascular;  Laterality: N/A;   INGUINAL HERNIA REPAIR Bilateral    INSERT / REPLACE / REMOVE PACEMAKER  05/20/2009   MDT by Dr Ellin for complete heart block   LEFT AND RIGHT HEART CATHETERIZATION WITH CORONARY ANGIOGRAM N/A 08/16/2013   Procedure: LEFT AND RIGHT HEART CATHETERIZATION WITH CORONARY ANGIOGRAM;  Surgeon: [name] [name], MD;  Location: [facility] CATH LAB;  Service: Cardiovascular;  Laterality: N/A;   Family History  Problem Relation Age of Onset   Diabetes Father    Social History   Occupational History   Occupation: Sports Administrator: [employer]   Occupation: retired  Tobacco Use   Smoking status: Former    Current packs/day: 1.00    Average packs/day: 1 pack/day for 15.0  years (15.0 ttl pk-yrs)    Types: Cigarettes   Smokeless tobacco: Never   Tobacco comments:    quit smoking cigarettes in 1985  Vaping Use   Vaping status: Never Used  Substance and Sexual Activity   Alcohol use: Yes    Alcohol/week: 7.0 standard drinks of alcohol    Types: 7 Shots of liquor per week   Drug use: No   Sexual activity: Not Currently   Tobacco Counseling Counseling given: Not Answered Tobacco comments: quit smoking cigarettes in 1985  SDOH Screenings   Food Insecurity: No Food Insecurity (09/28/2024)  Housing: Unknown (09/28/2024)  Transportation Needs: No Transportation Needs (09/28/2024)  Utilities: Not At Risk (09/28/2024)  Alcohol Screen: Low Risk (09/28/2024)  Depression (PHQ2-9): Low Risk (09/28/2024)  Financial Resource Strain: Low Risk (09/28/2024)  Physical Activity: Sufficiently Active (09/28/2024)  Social Connections: Moderately Integrated (09/28/2024)  Stress: No Stress Concern Present (09/28/2024)  Tobacco Use: Medium Risk (09/28/2024)  Health Literacy: Adequate Health Literacy (09/28/2024)   See flowsheets for full screening details  Depression Screen PHQ 2 & 9 Depression Scale- Over the past 2 weeks, how often have you been bothered by any of the following problems? Little interest or pleasure in doing things: 0 Feeling  down, depressed, or hopeless (PHQ Adolescent also includes...irritable): 0 PHQ-2 Total Score: 0 Trouble falling or staying asleep, or sleeping too much: 2 Feeling tired or having little energy: 0 Poor appetite or overeating (PHQ Adolescent also includes...weight loss): 0 Feeling bad about yourself - or that you are a failure or have let yourself or your family down: 0 Trouble concentrating on things, such as reading the newspaper or watching television (PHQ Adolescent also includes...like school work): 0 Moving or speaking so slowly that other people could have noticed. Or the opposite - being so fidgety or restless that you have been  moving around a lot more than usual: 0 Thoughts that you would be better off dead, or of hurting yourself in some way: 0 PHQ-9 Total Score: 2 If you checked off any problems, how difficult have these problems made it for you to do your work, take care of things at home, or get along with other people?: Not difficult at all  Depression Treatment Depression Interventions/Treatment : EYV7-0 Score <4 Follow-up Not Indicated     Goals Addressed               This Visit's Progress     stay alive (pt-stated)               Objective:    Today's Vitals   09/28/24 0923 09/28/24 0943  BP: (!) 150/80 138/78  Pulse: 76   Temp: 97.7 F (36.5 C)   TempSrc: Oral   Weight: 274 lb (124.3 kg)   Height: 5' 9 (1.753 m)    Body mass index is 40.46 kg/m.  Hearing/Vision screen Vision Screening - Comments:: Regular eye exams, VA Immunizations and Health Maintenance Health Maintenance  Topic Date Due   DTaP/Tdap/Td (1 - Tdap) Never done   Influenza Vaccine  04/08/2024   COVID-19 Vaccine (7 - 2025-26 season) 05/09/2024   Medicare Annual Wellness (AWV)  09/28/2025   Pneumococcal Vaccine: 50+ Years  Completed   Zoster Vaccines- Shingrix  Completed   Meningococcal B Vaccine  Aged Out   Hepatitis C Screening  Discontinued        Assessment/Plan:  This is a routine wellness examination for Bary.  Patient Care Team: Jarold Medici, MD as PCP - General (Internal Medicine) Lilian Pierre Marsa Raddle., MD as Referring Physician (Cardiology)  I have personally reviewed and noted the following in the patients chart:   Medical and social history Use of alcohol, tobacco or illicit drugs  Current medications and supplements including opioid prescriptions. Functional ability and status Nutritional status Physical activity Advanced directives  List of other physicians Hospitalizations, surgeries, and ER visits in previous 12 months Vitals Screenings to include cognitive,  depression, and falls Referrals and appointments  No orders of the defined types were placed in this encounter.  In addition, I have reviewed and discussed with patient certain preventive protocols, quality metrics, and best practice recommendations. A written personalized care plan for preventive services as well as general preventive health recommendations were provided to patient.   Ardella FORBES Dawn, LPN   8/78/7973   Return in 1 year (on 09/28/2025).  After Visit Summary: (In Person-Printed) AVS printed and given to the patient  Nurse Notes: Vaccines not given: TDAP, covid and flu declined today  "

## 2024-10-17 ENCOUNTER — Encounter (HOSPITAL_BASED_OUTPATIENT_CLINIC_OR_DEPARTMENT_OTHER): Admitting: General Surgery

## 2025-10-04 ENCOUNTER — Ambulatory Visit: Payer: Self-pay
